# Patient Record
Sex: Female | Born: 1955 | Race: White | Hispanic: No | Smoking: Never smoker
Health system: Southern US, Community
[De-identification: ages and names within clinical notes are randomized; demographics above are authoritative.]

## PROBLEM LIST (undated history)

## (undated) DIAGNOSIS — E785 Hyperlipidemia, unspecified: Secondary | ICD-10-CM

## (undated) DIAGNOSIS — M5136 Other intervertebral disc degeneration, lumbar region: Secondary | ICD-10-CM

## (undated) DIAGNOSIS — H40009 Preglaucoma, unspecified, unspecified eye: Secondary | ICD-10-CM

## (undated) DIAGNOSIS — O24419 Gestational diabetes mellitus in pregnancy, unspecified control: Secondary | ICD-10-CM

## (undated) DIAGNOSIS — M51369 Other intervertebral disc degeneration, lumbar region without mention of lumbar back pain or lower extremity pain: Secondary | ICD-10-CM

## (undated) DIAGNOSIS — C801 Malignant (primary) neoplasm, unspecified: Secondary | ICD-10-CM

## (undated) DIAGNOSIS — I779 Disorder of arteries and arterioles, unspecified: Secondary | ICD-10-CM

## (undated) DIAGNOSIS — I219 Acute myocardial infarction, unspecified: Secondary | ICD-10-CM

## (undated) HISTORY — PX: APPENDECTOMY: SHX54

## (undated) HISTORY — PX: CHOLECYSTECTOMY: SHX55

## (undated) HISTORY — PX: ABDOMINAL HYSTERECTOMY: SHX81

---

## 1984-04-05 HISTORY — PX: AUGMENTATION MAMMAPLASTY: SUR837

## 2008-08-30 ENCOUNTER — Ambulatory Visit: Payer: Self-pay | Admitting: Sports Medicine

## 2008-10-22 ENCOUNTER — Ambulatory Visit: Payer: Self-pay | Admitting: Internal Medicine

## 2009-01-13 ENCOUNTER — Ambulatory Visit: Payer: Self-pay | Admitting: Gastroenterology

## 2009-08-11 ENCOUNTER — Emergency Department: Payer: Self-pay | Admitting: Internal Medicine

## 2009-10-28 ENCOUNTER — Ambulatory Visit: Payer: Self-pay | Admitting: Internal Medicine

## 2010-06-03 ENCOUNTER — Ambulatory Visit: Payer: Self-pay | Admitting: Otolaryngology

## 2011-10-22 ENCOUNTER — Ambulatory Visit: Payer: Self-pay | Admitting: Internal Medicine

## 2011-11-01 ENCOUNTER — Ambulatory Visit: Payer: Self-pay | Admitting: Internal Medicine

## 2012-05-01 ENCOUNTER — Ambulatory Visit: Payer: Self-pay | Admitting: Surgery

## 2012-11-22 ENCOUNTER — Ambulatory Visit: Payer: Self-pay | Admitting: Internal Medicine

## 2013-01-20 ENCOUNTER — Observation Stay: Payer: Self-pay | Admitting: Surgery

## 2013-01-20 LAB — COMPREHENSIVE METABOLIC PANEL
Anion Gap: 10 (ref 7–16)
Bilirubin,Total: 0.7 mg/dL (ref 0.2–1.0)
Calcium, Total: 9.1 mg/dL (ref 8.5–10.1)
Chloride: 105 mmol/L (ref 98–107)
Creatinine: 0.62 mg/dL (ref 0.60–1.30)
EGFR (Non-African Amer.): >60
Glucose: 144 mg/dL — ABNORMAL HIGH (ref 65–99)
SGPT (ALT): 598 U/L — ABNORMAL HIGH (ref 12–78)
Sodium: 138 mmol/L (ref 136–145)

## 2013-01-20 LAB — URINALYSIS, COMPLETE
Glucose,UR: NEGATIVE mg/dL (ref 0–75)
Nitrite: NEGATIVE
Ph: 5 (ref 4.5–8.0)
Protein: 30
RBC,UR: 2 /HPF (ref 0–5)
Specific Gravity: 1.02 (ref 1.003–1.030)
WBC UR: 4 /HPF (ref 0–5)

## 2013-01-20 LAB — CBC
Platelet: 228 10*3/uL (ref 150–440)
RBC: 4.61 10*6/uL (ref 3.80–5.20)
WBC: 9.8 10*3/uL (ref 3.6–11.0)

## 2013-01-20 LAB — LIPASE, BLOOD: Lipase: 88 U/L (ref 73–393)

## 2013-01-21 LAB — CBC WITH DIFFERENTIAL/PLATELET
Basophil #: 0 10*3/uL (ref 0.0–0.1)
HCT: 39 % (ref 35.0–47.0)
HGB: 13.4 g/dL (ref 12.0–16.0)
Lymphocyte #: 0.7 10*3/uL — ABNORMAL LOW (ref 1.0–3.6)
MCHC: 34.3 g/dL (ref 32.0–36.0)
Monocyte #: 0.8 x10 3/mm (ref 0.2–0.9)
Monocyte %: 8 %
Neutrophil #: 8.8 10*3/uL — ABNORMAL HIGH (ref 1.4–6.5)
Platelet: 187 10*3/uL (ref 150–440)
RBC: 4.14 10*6/uL (ref 3.80–5.20)
RDW: 12.7 % (ref 11.5–14.5)

## 2013-01-21 LAB — BASIC METABOLIC PANEL
BUN: 8 mg/dL (ref 7–18)
Chloride: 104 mmol/L (ref 98–107)
Creatinine: 0.63 mg/dL (ref 0.60–1.30)
EGFR (African American): >60
Glucose: 189 mg/dL — ABNORMAL HIGH (ref 65–99)
Osmolality: 275 (ref 275–301)
Potassium: 4 mmol/L (ref 3.5–5.1)
Sodium: 136 mmol/L (ref 136–145)

## 2013-01-21 LAB — HEPATIC FUNCTION PANEL A (ARMC)
Albumin: 2.9 g/dL — ABNORMAL LOW (ref 3.4–5.0)
Alkaline Phosphatase: 86 U/L (ref 50–136)
SGOT(AST): 49 U/L — ABNORMAL HIGH (ref 15–37)
SGPT (ALT): 335 U/L — ABNORMAL HIGH (ref 12–78)
Total Protein: 6.5 g/dL (ref 6.4–8.2)

## 2013-01-22 LAB — CBC WITH DIFFERENTIAL/PLATELET
Basophil #: 0 10*3/uL (ref 0.0–0.1)
Eosinophil #: 0.1 10*3/uL (ref 0.0–0.7)
Eosinophil %: 1 %
HCT: 34.7 % — ABNORMAL LOW (ref 35.0–47.0)
Lymphocyte #: 0.8 10*3/uL — ABNORMAL LOW (ref 1.0–3.6)
Lymphocyte %: 13.7 %
MCH: 31.5 pg (ref 26.0–34.0)
MCHC: 33.5 g/dL (ref 32.0–36.0)
MCV: 94 fL (ref 80–100)
Monocyte #: 0.4 x10 3/mm (ref 0.2–0.9)
Neutrophil #: 4.4 10*3/uL (ref 1.4–6.5)
Neutrophil %: 77 %
Platelet: 173 10*3/uL (ref 150–440)
RBC: 3.7 10*6/uL — ABNORMAL LOW (ref 3.80–5.20)
WBC: 5.7 10*3/uL (ref 3.6–11.0)

## 2013-01-22 LAB — HEPATIC FUNCTION PANEL A (ARMC)
Alkaline Phosphatase: 149 U/L — ABNORMAL HIGH (ref 50–136)
Bilirubin, Direct: 0.1 mg/dL (ref 0.00–0.20)
SGOT(AST): 113 U/L — ABNORMAL HIGH (ref 15–37)
Total Protein: 6 g/dL — ABNORMAL LOW (ref 6.4–8.2)

## 2013-01-22 LAB — BASIC METABOLIC PANEL
BUN: 6 mg/dL — ABNORMAL LOW (ref 7–18)
Co2: 26 mmol/L (ref 21–32)
Creatinine: 0.5 mg/dL — ABNORMAL LOW (ref 0.60–1.30)
EGFR (African American): >60
Osmolality: 280 (ref 275–301)
Potassium: 3.8 mmol/L (ref 3.5–5.1)

## 2014-01-30 ENCOUNTER — Ambulatory Visit: Payer: Self-pay | Admitting: Podiatry

## 2014-02-27 ENCOUNTER — Ambulatory Visit: Payer: Self-pay | Admitting: Internal Medicine

## 2014-11-27 DIAGNOSIS — E1169 Type 2 diabetes mellitus with other specified complication: Secondary | ICD-10-CM | POA: Insufficient documentation

## 2014-11-27 DIAGNOSIS — E785 Hyperlipidemia, unspecified: Secondary | ICD-10-CM | POA: Insufficient documentation

## 2014-11-27 DIAGNOSIS — E782 Mixed hyperlipidemia: Secondary | ICD-10-CM | POA: Insufficient documentation

## 2015-03-28 NOTE — H&P (Signed)
PATIENT NAME:  Tammie Smith, Tammie Smith MR#:  [id number] DATE OF BIRTH:  [birth year]  DATE OF ADMISSION:  01/20/2013  PRIMARY CARE PHYSICIAN: John B. Sarina Ser, MD.   ADMITTING PHYSICIAN: Rodena Goldmann, III, MD.   CHIEF COMPLAINT: Abdominal pain, nausea and vomiting.   BRIEF HISTORY: The patient is a 59 year old woman seen in the Emergency Room with a several day history of abdominal pain. The pain started several days ago in her midepigastric upper abdominal area and was associated with profound nausea and vomiting. The pain progressed over the course of the week. She has had increasing abdominal discomfort, unable to eat anything on her stomach and continued to have pain radiating through her back and shoulder. She presented to the Emergency Room for further evaluation. White blood cell count was not elevated. Liver function studies showed elevated transaminases but normal bilirubin. Ultrasound was performed, which revealed mild pericholecystic fluid and evidence for gallbladder wall thickening with multiple stones and sludge.   PAST MEDICAL HISTORY: She denies any previous history of biliary tract disease, although she has had intermittent abdominal symptoms that she has described as heartburn. She has had no fatty food intolerance. She denies history of hepatitis, yellow jaundice, pancreatitis, peptic ulcer disease. She has been diagnosed as having diverticulosis on a colonoscopy. She has no cardiac disease, hypertension or diabetes.   PAST SURGICAL HISTORY: Only previous surgeries include an appendectomy and TAH/BSO procedure.   MEDICATIONS: She takes no medications regularly.   ALLERGIES: SHE DOES HAVE AN ALLERGY TO DEMEROL.   SOCIAL HISTORY: She is not a cigarette smoker and does not drink alcohol regularly.   FAMILY HISTORY: Noncontributory with the exception of diabetes and previous gallbladder surgery in her mother.  REVIEW OF SYSTEMS: Ten-point review of systems is undertaken with the patient  and is unremarkable.   PHYSICAL EXAMINATION: GENERAL: She is an alert, pleasant, comfortable woman in moderate distress from pain.  VITAL SIGNS: Blood pressure is 130/66. Heart rate is 112 and regular. She is afebrile but was 100.3 on admission. Pain scale is now a 6.  HEENT EXAM: Unremarkable. No scleral icterus. No pupillary abnormalities. No facial deformities.  NECK: Supple and nontender with no adenopathy, and she has a midline trachea.  CHEST: Clear with no adventitious sounds. She has normal pulmonary excursion.  CARDIAC: No murmurs or gallops. She seems to be in normal sinus rhythm.  ABDOMEN: Soft. She does have some mild right upper quadrant and right flank tenderness. There is some mild guarding, with no rebound. She has hypoactive but present bowel sounds.  LOWER EXTREMITY EXAM: Full range of motion. No deformities. Good distal pulses.  PSYCHIATRIC EXAM: Normal orientation and normal affect.   ASSESSMENT AND PLAN: This woman appears to have symptomatic biliary tract disease, possible acute cholecystitis. We will plan to admit her to the hospital and start her on hydration and antibiotics. We will make plans for surgical intervention as can be scheduled in the Operating Room. Risks, benefits and options to include the common duct injury have been outlined to the patient in detail, and she is in agreement. Her husband was present for the interview.    ____________________________ Micheline Maze, MD rle:lg D: 01/20/2013 12:51:39 ET T: 01/20/2013 15:02:21 ET JOB#: [id number]  cc: Micheline Maze, MD, <Dictator> John B. Sarina Ser, MD Rodena Goldmann MD ELECTRONICALLY SIGNED 01/26/2013 17:20

## 2015-03-28 NOTE — Op Note (Signed)
PATIENT NAME:  Tammie Smith, Tammie Smith MR#:  [id number] DATE OF BIRTH:  Nov 18, 1956  DATE OF PROCEDURE:  01/21/2013  PREOPERATIVE DIAGNOSIS:  Acute cholecystitis.   POSTOPERATIVE DIAGNOSIS:  Acute cholecystitis.  PROCEDURE:  Laparoscopic cholecystectomy.  SURGEON:  Dia Crawford, MD  ANESTHESIA: General.   OPERATIVE PROCEDURE: With the patient in the supine position after the induction of appropriate general anesthesia, the patient's abdomen was prepped with ChloraPrep and draped in sterile towels. The patient was placed in the head down, feet up position. A small infraumbilical incision was made in the standard fashion and carried down bluntly through the subcutaneous tissue. The Veress needle was used to cannulate the peritoneal cavity. CO2 was insufflated to appropriate pressure measurements. When the approximately 2.5 liters of CO2 were instilled, the Veress needle was withdrawn and an 11 mm [employer] port was inserted into the peritoneal cavity. Preoperative position was confirmed. CO2 was re-insufflated. The patient was placed in the head up, feet down position and rotated slightly to the left side. A subxiphoid transverse incision was made and an 11 mm port inserted under direct vision.  Two lateral ports, 5 mm in size, were inserted under direct vision.  The gallbladder was densely inflamed, erythematous and markedly edematous.   The gallbladder was aspirated of approximately 40 mL of dirty, motor oil-colored bile. The gallbladder was then retracted superiorly and laterally exposing the hepatoduodenal ligament. The cystic artery and cystic duct were identified. The cystic duct was doubly clipped and divided. The cystic artery was doubly clipped and divided. The gallbladder was then dissected free from its bed and delivered using the hook and cautery apparatus. Dissection was quite difficult because of the severity of the inflammatory change.  A small rent was made in the dome of the gallbladder upon  removal from the liver.  An Endo Catch apparatus was inserted through the mid epigastric port and the gallbladder captured in the Endo Catch apparatus.  The upper incision was enlarged to allow for removal of the distended stone and lately gallbladder.   The area was then copiously suctioned and irrigated with 3 liters of warm saline solution. All visible stones were retrieved. A 19 [place] Blake drain was inserted through one of the incisions and brought out through the lower port site. The upper midline incision was closed with 2 figure-of-eight sutures of 0 Vicryl under tract vision using the suture passer apparatus. The area was infiltrated with 0.25% Marcaine for postoperative pain control. The abdomen was desufflated. Skin incisions were closed with 5-0 nylon. Sterile dressings were applied. The patient was returned to the recovery room having tolerated the procedure well. Sponge, instrument and needle counts were correct x 2 in the operating room.     ____________________________ Micheline Maze, MD rle:ct D: 01/21/2013 10:04:53 ET T: 01/21/2013 11:59:43 ET JOB#: [id number]  cc: Micheline Maze, MD, <Dictator> John B. Sarina Ser, MD Rodena Goldmann MD ELECTRONICALLY SIGNED 01/26/2013 17:20

## 2015-04-03 ENCOUNTER — Other Ambulatory Visit: Payer: Self-pay

## 2015-04-03 DIAGNOSIS — Z1231 Encounter for screening mammogram for malignant neoplasm of breast: Secondary | ICD-10-CM

## 2015-05-06 ENCOUNTER — Ambulatory Visit
Admission: RE | Admit: 2015-05-06 | Discharge: 2015-05-06 | Disposition: A | Discharge status: Home / Self Care | Payer: 59 | Source: Ambulatory Visit | Attending: Internal Medicine | Admitting: Internal Medicine

## 2015-05-06 DIAGNOSIS — Z1231 Encounter for screening mammogram for malignant neoplasm of breast: Secondary | ICD-10-CM

## 2015-05-06 DIAGNOSIS — Z85828 Personal history of other malignant neoplasm of skin: Secondary | ICD-10-CM | POA: Diagnosis not present

## 2015-05-06 HISTORY — DX: Malignant (primary) neoplasm, unspecified: C80.1

## 2015-12-05 ENCOUNTER — Other Ambulatory Visit: Payer: Self-pay | Admitting: Obstetrics and Gynecology

## 2015-12-05 DIAGNOSIS — T8542XA Displacement of breast prosthesis and implant, initial encounter: Secondary | ICD-10-CM

## 2015-12-15 ENCOUNTER — Encounter: Admission: RE | Payer: Self-pay | Source: Ambulatory Visit

## 2015-12-15 ENCOUNTER — Ambulatory Visit: Admission: RE | Admit: 2015-12-15 | Payer: 59 | Source: Ambulatory Visit | Admitting: Gastroenterology

## 2015-12-15 HISTORY — DX: Hyperlipidemia, unspecified: E78.5

## 2015-12-15 HISTORY — DX: Gestational diabetes mellitus in pregnancy, unspecified control: O24.419

## 2015-12-15 SURGERY — COLONOSCOPY WITH PROPOFOL
Anesthesia: General

## 2015-12-16 ENCOUNTER — Other Ambulatory Visit: Payer: 59

## 2015-12-16 ENCOUNTER — Ambulatory Visit: Payer: 59

## 2016-01-09 ENCOUNTER — Encounter: Payer: Self-pay | Admitting: *Deleted

## 2016-01-12 ENCOUNTER — Ambulatory Visit
Admission: RE | Admit: 2016-01-12 | Discharge: 2016-01-12 | Disposition: A | Discharge status: Home / Self Care | Payer: 59 | Source: Ambulatory Visit | Attending: Gastroenterology | Admitting: Gastroenterology

## 2016-01-12 ENCOUNTER — Encounter
Admission: RE | Disposition: A | Discharge status: Home / Self Care | Payer: Self-pay | Source: Ambulatory Visit | Attending: Gastroenterology

## 2016-01-12 ENCOUNTER — Encounter: Payer: Self-pay | Admitting: Anesthesiology

## 2016-01-12 ENCOUNTER — Encounter: Payer: Self-pay | Admitting: *Deleted

## 2016-01-12 DIAGNOSIS — Z8632 Personal history of gestational diabetes: Secondary | ICD-10-CM | POA: Insufficient documentation

## 2016-01-12 DIAGNOSIS — K5730 Diverticulosis of large intestine without perforation or abscess without bleeding: Secondary | ICD-10-CM | POA: Diagnosis not present

## 2016-01-12 DIAGNOSIS — Q438 Other specified congenital malformations of intestine: Secondary | ICD-10-CM | POA: Insufficient documentation

## 2016-01-12 DIAGNOSIS — E785 Hyperlipidemia, unspecified: Secondary | ICD-10-CM | POA: Insufficient documentation

## 2016-01-12 DIAGNOSIS — Z1211 Encounter for screening for malignant neoplasm of colon: Secondary | ICD-10-CM | POA: Insufficient documentation

## 2016-01-12 DIAGNOSIS — Z79899 Other long term (current) drug therapy: Secondary | ICD-10-CM | POA: Diagnosis not present

## 2016-01-12 DIAGNOSIS — Z885 Allergy status to narcotic agent status: Secondary | ICD-10-CM | POA: Insufficient documentation

## 2016-01-12 HISTORY — PX: COLONOSCOPY WITH PROPOFOL: SHX5780

## 2016-01-12 HISTORY — DX: Preglaucoma, unspecified, unspecified eye: H40.009

## 2016-01-12 LAB — HM COLONOSCOPY

## 2016-01-12 SURGERY — COLONOSCOPY WITH PROPOFOL
Anesthesia: General

## 2016-01-12 MED ORDER — MIDAZOLAM HCL 5 MG/5ML IJ SOLN
INTRAMUSCULAR | Status: AC
  Filled 2016-01-12: qty 15

## 2016-01-12 MED ORDER — FENTANYL CITRATE (PF) 100 MCG/2ML IJ SOLN
INTRAMUSCULAR | Status: DC | PRN
  Administered 2016-01-12: 25 ug via INTRAVENOUS
  Administered 2016-01-12 (×3): 12.5 ug via INTRAVENOUS

## 2016-01-12 MED ORDER — SODIUM CHLORIDE 0.9 % IV SOLN
INTRAVENOUS | Status: DC
  Administered 2016-01-12: 1000 mL via INTRAVENOUS

## 2016-01-12 MED ORDER — PROMETHAZINE HCL 25 MG/ML IJ SOLN
INTRAMUSCULAR | Status: AC
  Filled 2016-01-12: qty 1

## 2016-01-12 MED ORDER — MIDAZOLAM HCL 5 MG/5ML IJ SOLN
INTRAMUSCULAR | Status: DC | PRN
  Administered 2016-01-12: 1 mg via INTRAVENOUS
  Administered 2016-01-12: 0.5 mg via INTRAVENOUS
  Administered 2016-01-12 (×3): 1 mg via INTRAVENOUS
  Administered 2016-01-12: 2 mg via INTRAVENOUS

## 2016-01-12 MED ORDER — PROMETHAZINE HCL 25 MG/ML IJ SOLN
INTRAMUSCULAR | Status: DC | PRN
  Administered 2016-01-12: 12.5 mg via INTRAVENOUS

## 2016-01-12 MED ORDER — FENTANYL CITRATE (PF) 100 MCG/2ML IJ SOLN
INTRAMUSCULAR | Status: AC
  Filled 2016-01-12: qty 4

## 2016-01-12 NOTE — Op Note (Signed)
[facility] Gastroenterology Patient Name: Tammie Smith Procedure Date: 01/12/2016 2:29 PM MRN: [id number] Account #: [id number] Date of Birth: 01/09/1956 Admit Type: Outpatient Age: 60 Room: [facility] ENDO ROOM 3 Gender: Female Note Status: Finalized Procedure:         Colonoscopy Indications:       Screening for colorectal malignant neoplasm Providers:         Lollie Sails, MD Referring MD:      Eduard Clos. Gilford Rile, MD (Referring MD) Medicines:         Fentanyl 60 micrograms IV, Midazolam 6.5 mg IV,                     Promethazine [id number] mg IV Complications:     No immediate complications. Procedure:         Pre-Anesthesia Assessment:                    - ASA Grade Assessment: II - A patient with mild systemic                     disease.                    After obtaining informed consent, the colonoscope was                     passed under direct vision. Throughout the procedure, the                     patient's blood pressure, pulse, and oxygen saturations                     were monitored continuously. The Colonoscope was                     introduced through the anus and advanced to the the cecum,                     identified by appendiceal orifice and ileocecal valve. The                     colonoscopy was unusually difficult due to restricted                     mobility of the colon, significant looping and a tortuous                     colon. Successful completion of the procedure was aided by                     changing the patient to a supine position, using manual                     pressure and withdrawing and reinserting the scope. The                     quality of the bowel preparation was good. Findings:      Multiple small-mouthed diverticula were found in the sigmoid colon and       in the distal descending colon.      The sigmoid colon, descending colon, splenic flexure and transverse       colon were significantly redundant.  The digital rectal exam was normal. Impression:        - Diverticulosis in  the sigmoid colon and in the distal                     descending colon.                    - Redundant colon.                    - No specimens collected. Recommendation:    - Repeat colonoscopy in 10 years for screening purposes. Procedure Code(s): --- Professional ---                    [phone], Colonoscopy, flexible; diagnostic, including                     collection of specimen(s) by brushing or washing, when                     performed (separate procedure) Diagnosis Code(s): --- Professional ---                    Z12.11, Encounter for screening for malignant neoplasm of                     colon                    K57.30, Diverticulosis of large intestine without                     perforation or abscess without bleeding                    Q43.8, Other specified congenital malformations of                     intestine CPT copyright 2014 American Medical Association. All rights reserved. The codes documented in this report are preliminary and upon coder review may  be revised to meet current compliance requirements. Lollie Sails, MD 01/12/2016 4:41:34 PM This report has been signed electronically. Number of Addenda: 0 Note Initiated On: 01/12/2016 2:29 PM Scope Withdrawal Time: 0 hours 7 minutes 15 seconds  Total Procedure Duration: 0 hours 58 minutes 3 seconds       [facility]

## 2016-01-12 NOTE — H&P (Signed)
Outpatient short stay form Pre-procedure 01/12/2016 3:03 PM Lollie Sails MD  Primary Physician: Dr. Lisette Grinder  Reason for visit:  Screening colonoscopy  History of present illness:  Patient is a 60 year old female presenting today for screening colonoscopy. She has had a colonoscopy in 01/13/2009 the prep was only fair 4 and recommendation was repeat in 5 years. No family history colon cancer colon polyps. Patient did take some Excedrin tablets yesterday. He takes no other aspirin products or blood thinning agents.    Current facility-administered medications:  .  0.9 %  sodium chloride infusion, , Intravenous, Continuous, Lollie Sails, MD .  fentaNYL (SUBLIMAZE) 100 MCG/2ML injection, , , ,  .  midazolam (VERSED) 5 MG/5ML injection, , , ,  .  promethazine (PHENERGAN) 25 MG/ML injection, , , ,   Prescriptions prior to admission  Medication Sig Dispense Refill Last Dose  . Cholecalciferol 1000 units tablet Take 1,000 Units by mouth daily.     Javier Docker Oil Omega-3 300 MG CAPS Take 1 tablet by mouth daily.     . Multiple Vitamin (MULTIVITAMIN) capsule Take 1 capsule by mouth daily.     . Zinc Acetate, Oral, (ZINC ACETATE PO) Take 1 tablet by mouth daily.        Allergies  Allergen Reactions  . Demerol [Meperidine]      Past Medical History  Diagnosis Date  . Cancer ([place])     skin cancer basal cell  . Hyperlipemia   . Gestational diabetes   . Borderline glaucoma     Review of systems:      Physical Exam    Heart and lungs: Regular rate and rhythm without rub or gallop, lungs are bilaterally clear.    HEENT: Norm cephalic atraumatic eyes are anicteric    Other:     Pertinant exam for procedure: Soft nontender nondistended bowel sounds positive normoactive.    Planned proceedures: Colonoscopy and indicated procedures. I have discussed the risks benefits and complications of procedures to include not limited to bleeding, infection, perforation and the risk  of sedation and the patient wishes to proceed.    Lollie Sails, MD Gastroenterology 01/12/2016  3:03 PM

## 2016-01-13 ENCOUNTER — Encounter: Payer: Self-pay | Admitting: Gastroenterology

## 2016-02-06 ENCOUNTER — Encounter: Payer: Self-pay | Admitting: Internal Medicine

## 2017-12-21 DIAGNOSIS — M5136 Other intervertebral disc degeneration, lumbar region: Secondary | ICD-10-CM | POA: Insufficient documentation

## 2017-12-21 DIAGNOSIS — Z0000 Encounter for general adult medical examination without abnormal findings: Secondary | ICD-10-CM

## 2017-12-21 DIAGNOSIS — E118 Type 2 diabetes mellitus with unspecified complications: Secondary | ICD-10-CM | POA: Insufficient documentation

## 2017-12-21 DIAGNOSIS — M51369 Other intervertebral disc degeneration, lumbar region without mention of lumbar back pain or lower extremity pain: Secondary | ICD-10-CM | POA: Insufficient documentation

## 2017-12-21 DIAGNOSIS — I779 Disorder of arteries and arterioles, unspecified: Secondary | ICD-10-CM | POA: Insufficient documentation

## 2017-12-21 HISTORY — DX: Encounter for general adult medical examination without abnormal findings: Z00.00

## 2018-03-29 DIAGNOSIS — M791 Myalgia, unspecified site: Secondary | ICD-10-CM | POA: Insufficient documentation

## 2018-08-25 ENCOUNTER — Other Ambulatory Visit: Payer: Self-pay | Admitting: Obstetrics and Gynecology

## 2018-08-25 DIAGNOSIS — Z1231 Encounter for screening mammogram for malignant neoplasm of breast: Secondary | ICD-10-CM

## 2018-09-21 ENCOUNTER — Ambulatory Visit
Admission: RE | Admit: 2018-09-21 | Discharge: 2018-09-21 | Disposition: A | Discharge status: Home / Self Care | Payer: Managed Care, Other (non HMO) | Source: Ambulatory Visit | Attending: Obstetrics and Gynecology | Admitting: Obstetrics and Gynecology

## 2018-09-21 ENCOUNTER — Other Ambulatory Visit: Payer: Self-pay | Admitting: Obstetrics and Gynecology

## 2018-09-21 DIAGNOSIS — Z1231 Encounter for screening mammogram for malignant neoplasm of breast: Secondary | ICD-10-CM | POA: Insufficient documentation

## 2018-09-25 DIAGNOSIS — N644 Mastodynia: Secondary | ICD-10-CM | POA: Insufficient documentation

## 2018-09-25 DIAGNOSIS — T8544XA Capsular contracture of breast implant, initial encounter: Secondary | ICD-10-CM | POA: Insufficient documentation

## 2019-08-16 ENCOUNTER — Other Ambulatory Visit (HOSPITAL_COMMUNITY): Payer: Self-pay | Admitting: Gastroenterology

## 2019-08-16 ENCOUNTER — Other Ambulatory Visit: Payer: Self-pay | Admitting: Gastroenterology

## 2019-08-16 DIAGNOSIS — R1310 Dysphagia, unspecified: Secondary | ICD-10-CM

## 2019-08-22 ENCOUNTER — Ambulatory Visit
Admission: RE | Admit: 2019-08-22 | Discharge: 2019-08-22 | Disposition: A | Discharge status: Home / Self Care | Payer: Managed Care, Other (non HMO) | Source: Ambulatory Visit | Attending: Gastroenterology | Admitting: Gastroenterology

## 2019-08-22 ENCOUNTER — Other Ambulatory Visit: Payer: Self-pay

## 2019-08-22 DIAGNOSIS — R1310 Dysphagia, unspecified: Secondary | ICD-10-CM | POA: Diagnosis present

## 2019-08-28 ENCOUNTER — Other Ambulatory Visit: Payer: Self-pay | Admitting: Obstetrics and Gynecology

## 2019-08-28 DIAGNOSIS — Z1231 Encounter for screening mammogram for malignant neoplasm of breast: Secondary | ICD-10-CM

## 2019-09-14 ENCOUNTER — Other Ambulatory Visit: Payer: Self-pay

## 2019-09-14 ENCOUNTER — Other Ambulatory Visit
Admission: RE | Admit: 2019-09-14 | Discharge: 2019-09-14 | Disposition: A | Discharge status: Home / Self Care | Payer: Managed Care, Other (non HMO) | Source: Ambulatory Visit | Attending: Gastroenterology | Admitting: Gastroenterology

## 2019-09-14 DIAGNOSIS — Z20828 Contact with and (suspected) exposure to other viral communicable diseases: Secondary | ICD-10-CM | POA: Insufficient documentation

## 2019-09-14 DIAGNOSIS — Z01812 Encounter for preprocedural laboratory examination: Secondary | ICD-10-CM | POA: Diagnosis present

## 2019-09-14 LAB — SARS CORONAVIRUS 2 (TAT 6-24 HRS): SARS Coronavirus 2: NEGATIVE

## 2019-09-17 ENCOUNTER — Encounter: Payer: Self-pay | Admitting: *Deleted

## 2019-09-18 ENCOUNTER — Ambulatory Visit: Payer: Managed Care, Other (non HMO) | Admitting: Anesthesiology

## 2019-09-18 ENCOUNTER — Ambulatory Visit
Admission: RE | Admit: 2019-09-18 | Discharge: 2019-09-18 | Disposition: A | Discharge status: Home / Self Care | Payer: Managed Care, Other (non HMO) | Attending: Gastroenterology | Admitting: Gastroenterology

## 2019-09-18 ENCOUNTER — Encounter: Payer: Self-pay | Admitting: Anesthesiology

## 2019-09-18 ENCOUNTER — Encounter
Admission: RE | Disposition: A | Discharge status: Home / Self Care | Payer: Self-pay | Source: Home / Self Care | Attending: Gastroenterology

## 2019-09-18 ENCOUNTER — Other Ambulatory Visit: Payer: Self-pay

## 2019-09-18 DIAGNOSIS — Z85828 Personal history of other malignant neoplasm of skin: Secondary | ICD-10-CM | POA: Diagnosis not present

## 2019-09-18 DIAGNOSIS — K449 Diaphragmatic hernia without obstruction or gangrene: Secondary | ICD-10-CM | POA: Diagnosis not present

## 2019-09-18 DIAGNOSIS — K208 Other esophagitis without bleeding: Secondary | ICD-10-CM | POA: Insufficient documentation

## 2019-09-18 DIAGNOSIS — K2960 Other gastritis without bleeding: Secondary | ICD-10-CM | POA: Insufficient documentation

## 2019-09-18 DIAGNOSIS — K222 Esophageal obstruction: Secondary | ICD-10-CM | POA: Insufficient documentation

## 2019-09-18 DIAGNOSIS — R1310 Dysphagia, unspecified: Secondary | ICD-10-CM | POA: Diagnosis present

## 2019-09-18 DIAGNOSIS — K2980 Duodenitis without bleeding: Secondary | ICD-10-CM | POA: Insufficient documentation

## 2019-09-18 HISTORY — DX: Other intervertebral disc degeneration, lumbar region: M51.36

## 2019-09-18 HISTORY — DX: Other intervertebral disc degeneration, lumbar region without mention of lumbar back pain or lower extremity pain: M51.369

## 2019-09-18 HISTORY — PX: ESOPHAGOGASTRODUODENOSCOPY (EGD) WITH PROPOFOL: SHX5813

## 2019-09-18 HISTORY — DX: Disorder of arteries and arterioles, unspecified: I77.9

## 2019-09-18 LAB — GLUCOSE, CAPILLARY: Glucose-Capillary: 112 mg/dL — ABNORMAL HIGH (ref 70–99)

## 2019-09-18 SURGERY — ESOPHAGOGASTRODUODENOSCOPY (EGD) WITH PROPOFOL
Anesthesia: General

## 2019-09-18 MED ORDER — FENTANYL CITRATE (PF) 100 MCG/2ML IJ SOLN
INTRAMUSCULAR | Status: DC | PRN
  Administered 2019-09-18: 50 ug via INTRAVENOUS

## 2019-09-18 MED ORDER — PROPOFOL 500 MG/50ML IV EMUL
INTRAVENOUS | Status: DC | PRN
  Administered 2019-09-18: 120 ug/kg/min via INTRAVENOUS

## 2019-09-18 MED ORDER — LIDOCAINE HCL (PF) 2 % IJ SOLN
INTRAMUSCULAR | Status: AC
  Filled 2019-09-18: qty 10

## 2019-09-18 MED ORDER — FENTANYL CITRATE (PF) 100 MCG/2ML IJ SOLN
INTRAMUSCULAR | Status: AC
  Filled 2019-09-18: qty 2

## 2019-09-18 MED ORDER — SODIUM CHLORIDE 0.9 % IV SOLN
INTRAVENOUS | Status: DC
  Administered 2019-09-18: 10:00:00 via INTRAVENOUS

## 2019-09-18 MED ORDER — MIDAZOLAM HCL 2 MG/2ML IJ SOLN
INTRAMUSCULAR | Status: DC | PRN
  Administered 2019-09-18: 2 mg via INTRAVENOUS

## 2019-09-18 MED ORDER — PROPOFOL 500 MG/50ML IV EMUL
INTRAVENOUS | Status: AC
  Filled 2019-09-18: qty 50

## 2019-09-18 MED ORDER — LIDOCAINE HCL (CARDIAC) PF 100 MG/5ML IV SOSY
PREFILLED_SYRINGE | INTRAVENOUS | Status: DC | PRN
  Administered 2019-09-18: 50 mg via INTRAVENOUS

## 2019-09-18 MED ORDER — MIDAZOLAM HCL 2 MG/2ML IJ SOLN
INTRAMUSCULAR | Status: AC
  Filled 2019-09-18: qty 2

## 2019-09-18 NOTE — Anesthesia Post-op Follow-up Note (Signed)
Anesthesia QCDR form completed.        

## 2019-09-18 NOTE — Anesthesia Procedure Notes (Signed)
Performed by: Vaughan Sine Pre-anesthesia Checklist: Patient identified, Emergency Drugs available, Suction available, Patient being monitored and Timeout performed Patient Re-evaluated:Patient Re-evaluated prior to induction Oxygen Delivery Method: Nasal cannula Preoxygenation: Pre-oxygenation with 100% oxygen Airway Equipment and Method: Bite block Placement Confirmation: positive ETCO2 and CO2 detector

## 2019-09-18 NOTE — Op Note (Signed)
[facility] Gastroenterology Patient Name: Tammie Smith Procedure Date: 09/18/2019 10:25 AM MRN: [id number] Account #: [id number] Date of Birth: 11-03-1956 Admit Type: Outpatient Age: 63 Room: [facility] ENDO ROOM 3 Gender: Female Note Status: Finalized Procedure:            Upper GI endoscopy Indications:          Dysphagia, Odynophagia Providers:            Lollie Sails, MD Referring MD:         Ocie Cornfield. Ouida Sills MD, MD (Referring MD) Medicines:            Monitored Anesthesia Care Complications:        No immediate complications. Procedure:            Pre-Anesthesia Assessment:                       - ASA Grade Assessment: II - A patient with mild                        systemic disease.                       After obtaining informed consent, the endoscope was                        passed under direct vision. Throughout the procedure,                        the patient's blood pressure, pulse, and oxygen                        saturations were monitored continuously. The Endoscope                        was introduced through the mouth, and advanced to the                        third part of duodenum. The upper GI endoscopy was                        accomplished without difficulty. The patient tolerated                        the procedure well. Findings:      LA Grade C (one or more mucosal breaks continuous between tops of 2 or       more mucosal folds, less than 75% circumference) esophagitis with no       bleeding, though friable, was found. Biopsies were taken with a cold       forceps for histology.      A small to medium hiatal hernia was found. The Z-line was a variable       distance from incisors; the hiatal hernia was sliding.      Patchy mild inflammation characterized by congestion (edema) and       erosions was found in the gastric antrum, in the prepyloric region of       the stomach and at the pylorus. Biopsies were taken with a cold  forceps       for histology. Biopsies were taken with a cold forceps for Helicobacter  pylori testing.      Patchy mild inflammation characterized by congestion (edema) and       erosions was found in the duodenal bulb.      The exam of the duodenum was otherwise normal.      Biopsies were taken with a cold forceps in the second portion of the       duodenum and in the third portion of the duodenum for histology.      The cardia and gastric fundus were normal on retroflexion otherwise.      A widely patent and non-obstructing Schatzki ring was found at the       gastroesophageal junction. Impression:           - LA Grade C erosive esophagitis. Biopsied.                       - Small hiatal hernia.                       - Erosive gastritis. Biopsied.                       - Erosive duodenitis.                       - Biopsies were taken with a cold forceps for histology                        in the second portion of the duodenum and in the third                        portion of the duodenum. Recommendation:       - Discharge patient to home.                       - Await pathology results.                       - Use Aciphex (rabeprazole) 20 mg PO daily daily.                       - Return to GI clinic in 4 weeks. Procedure Code(s):    --- Professional ---                       [phone], Esophagogastroduodenoscopy, flexible, transoral;                        with biopsy, single or multiple Diagnosis Code(s):    --- Professional ---                       K20.8, Other esophagitis                       K44.9, Diaphragmatic hernia without obstruction or                        gangrene                       K29.60, Other gastritis without bleeding                       K29.80, Duodenitis  without bleeding                       R13.10, Dysphagia, unspecified CPT copyright 2019 American Medical Association. All rights reserved. The codes documented in this report are preliminary and upon  coder review may  be revised to meet current compliance requirements. Lollie Sails, MD 09/18/2019 10:54:56 AM This report has been signed electronically. Number of Addenda: 0 Note Initiated On: 09/18/2019 10:25 AM      [facility]

## 2019-09-18 NOTE — Anesthesia Postprocedure Evaluation (Signed)
Anesthesia Post Note  Patient: Tammie Smith  Procedure(s) Performed: ESOPHAGOGASTRODUODENOSCOPY (EGD) WITH PROPOFOL (N/A )  Patient location during evaluation: Endoscopy Anesthesia Type: General Level of consciousness: awake and alert and oriented Pain management: pain level controlled Vital Signs Assessment: post-procedure vital signs reviewed and stable Respiratory status: spontaneous breathing, nonlabored ventilation and respiratory function stable Cardiovascular status: blood pressure returned to baseline and stable Postop Assessment: no signs of nausea or vomiting Anesthetic complications: no     Last Vitals:  Vitals:   09/18/19 0932 09/18/19 1052  BP: 135/70 126/89  Pulse: 90   Temp: (!) 35.4 C (!) 36.1 C  SpO2: 100%     Last Pain:  Vitals:   09/18/19 1052  TempSrc: Tympanic  PainSc: 0-No pain                 Amala Petion

## 2019-09-18 NOTE — H&P (Signed)
Outpatient short stay form Pre-procedure 09/18/2019 10:24 AM Lollie Sails MD  Primary Physician: Dr. Frazier Richards  Reason for visit: EGD  History of present illness: Patient is a 63 year old female presenting today for an EGD in regards to complaints of dysphagia/odynophagia.  In further discussion with her this sounds much like globus sensation at the base of the neck.  Does take a daily Excedrin.  He denies any regurgitation of foods.  Does describe epigastric bloating sensation across the epigastrium centrally and some upper epigastric pain.  This currently is not present.  He was prescribed a PPI but has not started that yet.    Current Facility-Administered Medications:  .  0.9 %  sodium chloride infusion, , Intravenous, Continuous, Lollie Sails, MD, Last Rate: 20 mL/hr at 09/18/19 [id number]  Medications Prior to Admission  Medication Sig Dispense Refill Last Dose  . CALCIUM CARB-VIT D-C-E-MINERAL PO Take by mouth.   Past Week at Unknown time  . Cholecalciferol 1000 units tablet Take 1,000 Units by mouth daily.   Past Week at Unknown time  . Multiple Vitamin (MULTIVITAMIN) capsule Take 1 capsule by mouth daily.   Past Week at Unknown time  . NIACIN PO Take 2,000 mg by mouth daily.     . Turmeric (QC TUMERIC COMPLEX PO) Take 1 tablet by mouth daily.     Javier Docker Oil Omega-3 300 MG CAPS Take 1 tablet by mouth daily.     . Zinc Acetate, Oral, (ZINC ACETATE PO) Take 1 tablet by mouth daily.        Allergies  Allergen Reactions  . Crestor [Rosuvastatin]     Abdominal pain, muscle pain  . Demerol [Meperidine]   . Omeprazole     Abdominal pain, muscle pain     Past Medical History:  Diagnosis Date  . Borderline glaucoma   . Cancer ([place])    skin cancer basal cell  . Degenerative disc disease, lumbar   . Gestational diabetes   . Hyperlipemia   . Right-sided carotid artery disease (HCC)    echo done    Review of systems:      Physical Exam    Heart and  lungs: Regular rate and rhythm without rub or gallop lungs are bilaterally clear    HEENT: Normocephalic atraumatic eyes are anicteric    Other:    Pertinant exam for procedure: Soft nontender nondistended bowel sounds positive normoactive    Planned proceedures: EGD and indicated procedures. I have discussed the risks benefits and complications of procedures to include not limited to bleeding, infection, perforation and the risk of sedation and the patient wishes to proceed.    Lollie Sails, MD Gastroenterology 09/18/2019  10:24 AM

## 2019-09-18 NOTE — Anesthesia Preprocedure Evaluation (Signed)
Anesthesia Evaluation  Patient identified by MRN, date of birth, ID band Patient awake    Reviewed: Allergy & Precautions, NPO status , Patient's Chart, lab work & pertinent test results  History of Anesthesia Complications Negative for: history of anesthetic complications  Airway Mallampati: II  TM Distance: >3 FB Neck ROM: Full    Dental no notable dental hx.    Pulmonary neg pulmonary ROS, neg sleep apnea, neg COPD,    breath sounds clear to auscultation- rhonchi (-) wheezing      Cardiovascular Exercise Tolerance: Good (-) hypertension(-) CAD, (-) Past MI, (-) Cardiac Stents and (-) CABG  Rhythm:Regular Rate:Normal - Systolic murmurs and - Diastolic murmurs    Neuro/Psych neg Seizures negative neurological ROS  negative psych ROS   GI/Hepatic negative GI ROS, Neg liver ROS,   Endo/Other  diabetes (borderline)  Renal/GU negative Renal ROS     Musculoskeletal  (+) Arthritis ,   Abdominal (+) + obese,   Peds  Hematology negative hematology ROS (+)   Anesthesia Other Findings Past Medical History: No date: Borderline glaucoma No date: Cancer ([place])     Comment:  skin cancer basal cell No date: Degenerative disc disease, lumbar No date: Gestational diabetes No date: Hyperlipemia No date: Right-sided carotid artery disease (HCC)     Comment:  echo done   Reproductive/Obstetrics                             Anesthesia Physical Anesthesia Plan  ASA: II  Anesthesia Plan: General   Post-op Pain Management:    Induction: Intravenous  PONV Risk Score and Plan: 2 and Propofol infusion  Airway Management Planned: Natural Airway  Additional Equipment:   Intra-op Plan:   Post-operative Plan:   Informed Consent: I have reviewed the patients History and Physical, chart, labs and discussed the procedure including the risks, benefits and alternatives for the proposed anesthesia with  the patient or authorized representative who has indicated his/her understanding and acceptance.     Dental advisory given  Plan Discussed with: CRNA and Anesthesiologist  Anesthesia Plan Comments:         Anesthesia Quick Evaluation

## 2019-09-18 NOTE — Transfer of Care (Signed)
Immediate Anesthesia Transfer of Care Note  Patient: Tammie Smith  Procedure(s) Performed: ESOPHAGOGASTRODUODENOSCOPY (EGD) WITH PROPOFOL (N/A )  Patient Location: PACU  Anesthesia Type:General  Level of Consciousness: awake and sedated  Airway & Oxygen Therapy: Patient Spontanous Breathing and Patient connected to nasal cannula oxygen  Post-op Assessment: Report given to RN and Post -op Vital signs reviewed and stable  Post vital signs: Reviewed and stable  Last Vitals:  Vitals Value Taken Time  BP    Temp    Pulse    Resp    SpO2      Last Pain: There were no vitals filed for this visit.       Complications: No apparent anesthesia complications

## 2019-09-19 ENCOUNTER — Encounter: Payer: Self-pay | Admitting: Gastroenterology

## 2019-09-19 LAB — SURGICAL PATHOLOGY

## 2019-10-02 ENCOUNTER — Other Ambulatory Visit: Payer: Self-pay | Admitting: Obstetrics and Gynecology

## 2019-10-02 ENCOUNTER — Ambulatory Visit
Admission: RE | Admit: 2019-10-02 | Discharge: 2019-10-02 | Disposition: A | Discharge status: Home / Self Care | Payer: Managed Care, Other (non HMO) | Source: Ambulatory Visit | Attending: Obstetrics and Gynecology | Admitting: Obstetrics and Gynecology

## 2019-10-02 DIAGNOSIS — Z1231 Encounter for screening mammogram for malignant neoplasm of breast: Secondary | ICD-10-CM

## 2020-04-01 ENCOUNTER — Encounter: Payer: Self-pay | Admitting: Gastroenterology

## 2020-04-01 ENCOUNTER — Other Ambulatory Visit: Payer: Self-pay

## 2020-04-01 ENCOUNTER — Ambulatory Visit (INDEPENDENT_AMBULATORY_CARE_PROVIDER_SITE_OTHER): Payer: Managed Care, Other (non HMO) | Admitting: Gastroenterology

## 2020-04-01 VITALS — BP 149/87 | HR 94 | Temp 98.7°F | Ht 63.0 in | Wt 161.2 lb

## 2020-04-01 DIAGNOSIS — K219 Gastro-esophageal reflux disease without esophagitis: Secondary | ICD-10-CM | POA: Diagnosis not present

## 2020-04-01 DIAGNOSIS — R112 Nausea with vomiting, unspecified: Secondary | ICD-10-CM | POA: Diagnosis not present

## 2020-04-01 MED ORDER — RABEPRAZOLE SODIUM 20 MG PO TBEC: 20.0000 mg | DELAYED_RELEASE_TABLET | Freq: Every day | ORAL | 3 refills | Status: DC

## 2020-04-01 NOTE — Progress Notes (Signed)
Gastroenterology Consultation  Referring Provider:     Kirk Ruths, MD Primary Care Physician:  Kirk Ruths, MD Primary Gastroenterologist:  Dr. Allen Norris     Reason for Consultation:     Nausea        HPI:   Tammie Smith is a 64 y.o. y/o female referred for consultation & management of nausea by Dr. Ouida Sills, Ocie Cornfield, MD.  This patient comes to me after being seen by GI at [facility] clinic.  The patient has met me before because I had diagnosed her husband with esophageal cancer.  The patient reports that she has had chronic nausea and was started on pantoprazole by the nurse practitioner.  She then underwent an upper endoscopy and was told that she had grade C esophagitis although the photographs taken at the time of the endoscopy appeared to be grade A esophagitis.  The patient reports that when she had the upper endoscopy she was told by the gastroenterologist at that time that she should be on AcipHex instead of the pantoprazole.  The patient never started the pantoprazole and was taking the AcipHex with some relief of her symptoms while taking Phenergan 12.5 mg as needed for her nausea.  The patient states that she ran out of the medication and had not gotten it refilled and started the pantoprazole that she had already filled previously.  She states that she did not have the same relief from the pantoprazole that she did from the AcipHex.  There is no report of any unexplained weight loss fevers chills nausea or vomiting.  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  She then reports that she lost her 65 year old brother to a motorcycle accident and subsequent to that her husband was diagnosed with esophageal cancer.  The patient denies any worry symptoms such as hematemesis.  Past Medical History:  Diagnosis Date  .  Borderline glaucoma   . Cancer ([place])    skin cancer basal cell  . Degenerative disc disease, lumbar   . Gestational diabetes   . Hyperlipemia   . Right-sided carotid artery disease ([place])    echo done    Past Surgical History:  Procedure Laterality Date  . ABDOMINAL HYSTERECTOMY    . APPENDECTOMY    . AUGMENTATION MAMMAPLASTY Bilateral 04/05/1984   Pt had implants removed last time  . CHOLECYSTECTOMY    . COLONOSCOPY WITH PROPOFOL N/A 01/12/2016   Procedure: COLONOSCOPY WITH PROPOFOL;  Surgeon: Lollie Sails, MD;  Location: [facility] ENDOSCOPY;  Service: Endoscopy;  Laterality: N/A;  . ESOPHAGOGASTRODUODENOSCOPY (EGD) WITH PROPOFOL N/A 09/18/2019   Procedure: ESOPHAGOGASTRODUODENOSCOPY (EGD) WITH PROPOFOL;  Surgeon: Lollie Sails, MD;  Location: [facility] ENDOSCOPY;  Service: Endoscopy;  Laterality: N/A;    Prior to Admission medications   Medication Sig Start Date End Date Taking? Authorizing Provider  CALCIUM CARB-VIT D-C-E-MINERAL PO Take by mouth.   Yes [provider]  Cholecalciferol 1000 units tablet Take 1,000 Units by mouth daily.   Yes [provider]  escitalopram (LEXAPRO) 10 MG tablet Take 1 tablet by mouth daily. 02/12/20 05/12/20 Yes [provider]  Astrid Drafts Omega-3 300 MG CAPS Take 1 tablet by mouth daily.   Yes [provider]  Multiple Minerals-Vitamins (BONE ESSENTIALS) CAPS Take 1 capsule by mouth 1 day or 1 dose.  Yes [provider]  Multiple Vitamin (MULTIVITAMIN) capsule Take 1 capsule by mouth daily.   Yes [provider]  NIACIN PO Take 2,000 mg by mouth daily.   Yes [provider]  promethazine (PHENERGAN) 12.5 MG tablet Take 12.5 mg by mouth every 8 (eight) hours as needed. 03/28/20  Yes [provider]  RABEprazole (ACIPHEX) 20 MG tablet Take 1 tablet (20 mg total) by mouth daily. 04/01/20  Yes Lucilla Lame, MD  Turmeric (QC TUMERIC COMPLEX PO) Take 1 tablet by mouth daily.   Yes [provider]  Zinc Acetate, Oral, (ZINC ACETATE PO) Take 1 tablet by mouth daily.   Yes [provider]    Family History  Problem Relation Age of Onset  . Breast cancer Neg Hx      Social History   Tobacco Use  . Smoking status: Never Smoker  . Smokeless tobacco: Never Used  Substance Use Topics  . Alcohol use: No  . Drug use: Never    Allergies as of 04/01/2020 - Review Complete 04/01/2020  Allergen Reaction Noted  . Codeine Nausea And Vomiting 12/05/2018  . Crestor [rosuvastatin]  09/17/2019  . Demerol [meperidine]  12/12/2015  . Omeprazole  09/17/2019    Review of Systems:    All systems reviewed and negative except where noted in HPI.   Physical Exam:  BP (!) 149/87   Pulse 94   Temp 98.7 F (37.1 C) (Oral)   Ht 5' 3"  (1.6 m)   Wt 161 lb 3.2 oz (73.1 kg)   BMI 28.56 kg/m  No LMP recorded. Patient has had a hysterectomy. General:   Alert,  Well-developed, well-nourished, pleasant and cooperative in NAD Head:  Normocephalic and atraumatic. Eyes:  Sclera clear, no icterus.   Conjunctiva pink. Ears:  Normal auditory acuity. Neck:  Supple; no masses or thyromegaly. Lungs:  Respirations even and unlabored.  Clear throughout to auscultation.   No wheezes, crackles, or rhonchi. No acute distress. Heart:  Regular rate and rhythm; no murmurs, clicks, rubs, or gallops. Abdomen:  Normal bowel sounds.  No bruits.  Soft, non-tender and non-distended without masses, hepatosplenomegaly or hernias noted.  No guarding or rebound tenderness.  Negative Carnett sign.   Rectal:  Deferred.  Pulses:  Normal pulses noted. Extremities:  No clubbing or edema.  No cyanosis. Neurologic:  Alert and oriented x3;  grossly normal neurologically. Skin:  Intact without significant lesions or rashes.  No jaundice. Lymph Nodes:  No significant cervical adenopathy. Psych:  Alert and cooperative. Normal mood and affect.  Imaging Studies: No results found.  Assessment and Plan:    Tammie Smith is a 64 y.o. y/o female who comes in today with a history of esophagitis and nausea which is likely caused by her reflux.  The patient states that she does better on the rabeprazole and she would like a 90-day supply of this.  The patient will be given a 90-day supply of this and has been told that if she runs low on the Phenergan we can also get her a prescription of that also.  The patient has been told that if her heartburn does not get any better she should let me know.  Patient has been explained the plan agrees with plan.    Lucilla Lame, MD. Marval Regal    Note: This dictation was prepared with Dragon dictation along with smaller phrase technology. Any transcriptional errors that result from this process are unintentional.

## 2020-09-17 ENCOUNTER — Other Ambulatory Visit: Payer: Self-pay | Admitting: Obstetrics and Gynecology

## 2020-09-17 DIAGNOSIS — Z1231 Encounter for screening mammogram for malignant neoplasm of breast: Secondary | ICD-10-CM

## 2020-10-17 ENCOUNTER — Other Ambulatory Visit: Payer: Self-pay

## 2020-10-17 ENCOUNTER — Ambulatory Visit
Admission: RE | Admit: 2020-10-17 | Discharge: 2020-10-17 | Disposition: A | Discharge status: Home / Self Care | Payer: Managed Care, Other (non HMO) | Source: Ambulatory Visit | Attending: Obstetrics and Gynecology | Admitting: Obstetrics and Gynecology

## 2020-10-17 DIAGNOSIS — Z1231 Encounter for screening mammogram for malignant neoplasm of breast: Secondary | ICD-10-CM | POA: Diagnosis present

## 2021-03-05 ENCOUNTER — Ambulatory Visit: Payer: Managed Care, Other (non HMO) | Admitting: Gastroenterology

## 2021-03-05 ENCOUNTER — Encounter: Payer: Self-pay | Admitting: Gastroenterology

## 2021-03-05 ENCOUNTER — Other Ambulatory Visit: Payer: Self-pay

## 2021-03-05 VITALS — BP 149/88 | HR 125 | Ht 63.0 in | Wt 163.0 lb

## 2021-03-05 DIAGNOSIS — K219 Gastro-esophageal reflux disease without esophagitis: Secondary | ICD-10-CM

## 2021-03-05 DIAGNOSIS — R112 Nausea with vomiting, unspecified: Secondary | ICD-10-CM | POA: Diagnosis not present

## 2021-03-05 MED ORDER — RABEPRAZOLE SODIUM 20 MG PO TBEC
20.0000 mg | DELAYED_RELEASE_TABLET | Freq: Every day | ORAL | 3 refills | Status: DC
Start: 2021-03-05 — End: 2022-08-16

## 2021-03-05 NOTE — Progress Notes (Signed)
Primary Care Physician: Kirk Ruths, MD  Primary Gastroenterologist:  Dr. Lucilla Lame  Chief Complaint  Patient presents with  . Medication Refill    HPI: Tammie Smith is a 65 y.o. female here for follow-up after being seen by me last year for nausea.  The patient had previously been seen at [facility] clinic and was diagnosed with esophagitis.  The patient had been doing well with rabeprazole and Phenergan. The patient reports that she sometimes wakes up in the middle the night with some epigastric discomfort which is readily relieved with taking crackers.  The patient has been taking her PPI in the morning and states that she does not have any symptoms during the entire day.  There is some intermittent nausea reported but also not on a regular basis.  Past Medical History:  Diagnosis Date  . Borderline glaucoma   . Cancer ([place])    skin cancer basal cell  . Degenerative disc disease, lumbar   . Gestational diabetes   . Hyperlipemia   . Right-sided carotid artery disease ([place])    echo done    Current Outpatient Medications  Medication Sig Dispense Refill  . CALCIUM CARB-VIT D-C-E-MINERAL PO Take by mouth.    . Cholecalciferol 1000 units tablet Take 1,000 Units by mouth daily.    Javier Docker Oil Omega-3 300 MG CAPS Take 1 tablet by mouth daily.    . Multiple Vitamin (MULTIVITAMIN) capsule Take 1 capsule by mouth daily.    Marland Kitchen NIACIN PO Take 2,000 mg by mouth daily.    . promethazine (PHENERGAN) 12.5 MG tablet Take 12.5 mg by mouth every 8 (eight) hours as needed.    . RABEprazole (ACIPHEX) 20 MG tablet Take 1 tablet (20 mg total) by mouth daily. 90 tablet 3  . Turmeric (QC TUMERIC COMPLEX PO) Take 1 tablet by mouth daily.    . Zinc Acetate, Oral, (ZINC ACETATE PO) Take 1 tablet by mouth daily.     No current facility-administered medications for this visit.    Allergies as of 03/05/2021 - Review Complete 03/05/2021  Allergen Reaction Noted  . Codeine Nausea And  Vomiting 12/05/2018  . Crestor [rosuvastatin]  09/17/2019  . Demerol [meperidine]  12/12/2015  . Omeprazole  09/17/2019    ROS:  General: Negative for anorexia, weight loss, fever, chills, fatigue, weakness. ENT: Negative for hoarseness, difficulty swallowing , nasal congestion. CV: Negative for chest pain, angina, palpitations, dyspnea on exertion, peripheral edema.  Respiratory: Negative for dyspnea at rest, dyspnea on exertion, cough, sputum, wheezing.  GI: See history of present illness. GU:  Negative for dysuria, hematuria, urinary incontinence, urinary frequency, nocturnal urination.  Endo: Negative for unusual weight change.    Physical Examination:   BP (!) 149/88   Pulse (!) 125   Ht 5\' 3"  (1.6 m)   Wt 163 lb (73.9 kg)   BMI 28.87 kg/m   General: Well-nourished, well-developed in no acute distress.  Eyes: No icterus. Conjunctivae pink. Lungs: Clear to auscultation bilaterally. Non-labored. Heart: Regular rate and rhythm, no murmurs rubs or gallops.  Abdomen: Bowel sounds are normal, nontender, nondistended, no hepatosplenomegaly or masses, no abdominal bruits or hernia , no rebound or guarding.   Extremities: No lower extremity edema. No clubbing or deformities. Neuro: Alert and oriented x 3.  Grossly intact. Skin: Warm and dry, no jaundice.   Psych: Alert and cooperative, normal mood and affect.  Labs:    Imaging Studies: No results found.  Assessment and Plan:  Tammie Smith is a 65 y.o. y/o female who comes in today with a history of reflux and is in need of a refill of her medication.  The patient has been told that since she has symptoms in the middle the night but usually not throughout the day she has been told to take a rabeprazole in the evening so that her blood levels of the medication is highest while she is supine and not while she is standing up.  The patient has been explained the plan and she has had her medications refilled.  The patient will  contact me if she has any further symptoms.     Lucilla Lame, MD. Marval Regal    Note: This dictation was prepared with Dragon dictation along with smaller phrase technology. Any transcriptional errors that result from this process are unintentional.

## 2021-09-24 DIAGNOSIS — F419 Anxiety disorder, unspecified: Secondary | ICD-10-CM

## 2021-09-24 HISTORY — DX: Anxiety disorder, unspecified: F41.9

## 2021-10-15 ENCOUNTER — Other Ambulatory Visit: Payer: Self-pay | Admitting: Obstetrics and Gynecology

## 2021-10-15 DIAGNOSIS — Z1231 Encounter for screening mammogram for malignant neoplasm of breast: Secondary | ICD-10-CM

## 2021-11-06 ENCOUNTER — Ambulatory Visit
Admission: RE | Admit: 2021-11-06 | Discharge: 2021-11-06 | Disposition: A | Discharge status: Home / Self Care | Payer: Managed Care, Other (non HMO) | Source: Ambulatory Visit | Attending: Obstetrics and Gynecology | Admitting: Obstetrics and Gynecology

## 2021-11-06 ENCOUNTER — Other Ambulatory Visit: Payer: Self-pay

## 2021-11-06 DIAGNOSIS — Z1231 Encounter for screening mammogram for malignant neoplasm of breast: Secondary | ICD-10-CM | POA: Diagnosis present

## 2022-05-18 ENCOUNTER — Other Ambulatory Visit (HOSPITAL_COMMUNITY): Payer: Self-pay

## 2022-08-06 DIAGNOSIS — G43919 Migraine, unspecified, intractable, without status migrainosus: Secondary | ICD-10-CM | POA: Insufficient documentation

## 2022-08-06 HISTORY — DX: Migraine, unspecified, intractable, without status migrainosus: G43.919

## 2022-08-16 ENCOUNTER — Other Ambulatory Visit: Payer: Self-pay

## 2022-08-16 ENCOUNTER — Emergency Department: Payer: Managed Care, Other (non HMO)

## 2022-08-16 ENCOUNTER — Inpatient Hospital Stay
Admission: EM | Admit: 2022-08-16 | Discharge: 2022-08-18 | DRG: 247 | Disposition: A | Discharge status: Home / Self Care | Payer: Managed Care, Other (non HMO) | Attending: Internal Medicine | Admitting: Internal Medicine

## 2022-08-16 ENCOUNTER — Observation Stay: Payer: Managed Care, Other (non HMO)

## 2022-08-16 DIAGNOSIS — E1169 Type 2 diabetes mellitus with other specified complication: Secondary | ICD-10-CM | POA: Diagnosis present

## 2022-08-16 DIAGNOSIS — I110 Hypertensive heart disease with heart failure: Secondary | ICD-10-CM | POA: Diagnosis present

## 2022-08-16 DIAGNOSIS — I10 Essential (primary) hypertension: Secondary | ICD-10-CM | POA: Diagnosis present

## 2022-08-16 DIAGNOSIS — I2510 Atherosclerotic heart disease of native coronary artery without angina pectoris: Secondary | ICD-10-CM

## 2022-08-16 DIAGNOSIS — R079 Chest pain, unspecified: Secondary | ICD-10-CM | POA: Diagnosis not present

## 2022-08-16 DIAGNOSIS — Z85828 Personal history of other malignant neoplasm of skin: Secondary | ICD-10-CM

## 2022-08-16 DIAGNOSIS — E118 Type 2 diabetes mellitus with unspecified complications: Secondary | ICD-10-CM | POA: Diagnosis present

## 2022-08-16 DIAGNOSIS — I779 Disorder of arteries and arterioles, unspecified: Secondary | ICD-10-CM | POA: Diagnosis present

## 2022-08-16 DIAGNOSIS — Z6825 Body mass index (BMI) 25.0-25.9, adult: Secondary | ICD-10-CM

## 2022-08-16 DIAGNOSIS — E785 Hyperlipidemia, unspecified: Secondary | ICD-10-CM | POA: Diagnosis present

## 2022-08-16 DIAGNOSIS — I214 Non-ST elevation (NSTEMI) myocardial infarction: Principal | ICD-10-CM | POA: Diagnosis present

## 2022-08-16 DIAGNOSIS — F32A Depression, unspecified: Secondary | ICD-10-CM | POA: Diagnosis present

## 2022-08-16 DIAGNOSIS — E663 Overweight: Secondary | ICD-10-CM | POA: Diagnosis present

## 2022-08-16 DIAGNOSIS — K219 Gastro-esophageal reflux disease without esophagitis: Secondary | ICD-10-CM | POA: Diagnosis present

## 2022-08-16 DIAGNOSIS — Z7984 Long term (current) use of oral hypoglycemic drugs: Secondary | ICD-10-CM

## 2022-08-16 DIAGNOSIS — M5136 Other intervertebral disc degeneration, lumbar region: Secondary | ICD-10-CM | POA: Diagnosis present

## 2022-08-16 DIAGNOSIS — Z79899 Other long term (current) drug therapy: Secondary | ICD-10-CM

## 2022-08-16 DIAGNOSIS — M1990 Unspecified osteoarthritis, unspecified site: Secondary | ICD-10-CM | POA: Diagnosis present

## 2022-08-16 DIAGNOSIS — E782 Mixed hyperlipidemia: Secondary | ICD-10-CM | POA: Diagnosis present

## 2022-08-16 DIAGNOSIS — I5032 Chronic diastolic (congestive) heart failure: Secondary | ICD-10-CM | POA: Diagnosis present

## 2022-08-16 HISTORY — DX: Atherosclerotic heart disease of native coronary artery without angina pectoris: I25.10

## 2022-08-16 LAB — APTT: aPTT: 27 seconds (ref 24–36)

## 2022-08-16 LAB — CBC
HCT: 42.3 % (ref 36.0–46.0)
Hemoglobin: 13.8 g/dL (ref 12.0–15.0)
MCH: 29.1 pg (ref 26.0–34.0)
MCHC: 32.6 g/dL (ref 30.0–36.0)
MCV: 89.2 fL (ref 80.0–100.0)
Platelets: 322 10*3/uL (ref 150–400)
RBC: 4.74 MIL/uL (ref 3.87–5.11)
RDW: 13 % (ref 11.5–15.5)
WBC: 6.6 10*3/uL (ref 4.0–10.5)
nRBC: 0 % (ref 0.0–0.2)

## 2022-08-16 LAB — PROTIME-INR
INR: 1 (ref 0.8–1.2)
Prothrombin Time: 13.2 seconds (ref 11.4–15.2)

## 2022-08-16 LAB — BASIC METABOLIC PANEL
Anion gap: 11 (ref 5–15)
BUN: 14 mg/dL (ref 8–23)
CO2: 24 mmol/L (ref 22–32)
Calcium: 9.7 mg/dL (ref 8.9–10.3)
Chloride: 105 mmol/L (ref 98–111)
Creatinine, Ser: 0.77 mg/dL (ref 0.44–1.00)
GFR, Estimated: >60 mL/min (ref >60)
Glucose, Bld: 125 mg/dL — ABNORMAL HIGH (ref 70–99)
Potassium: 4.1 mmol/L (ref 3.5–5.1)
Sodium: 140 mmol/L (ref 135–145)

## 2022-08-16 LAB — TROPONIN I (HIGH SENSITIVITY)
Troponin I (High Sensitivity): 38 ng/L — ABNORMAL HIGH (ref <18)
Troponin I (High Sensitivity): 44 ng/L — ABNORMAL HIGH (ref <18)

## 2022-08-16 LAB — BRAIN NATRIURETIC PEPTIDE: B Natriuretic Peptide: 157.4 pg/mL — ABNORMAL HIGH (ref 0.0–100.0)

## 2022-08-16 MED ORDER — NITROGLYCERIN 2 % TD OINT
1.0000 [in_us] | TOPICAL_OINTMENT | Freq: Four times a day (QID) | TRANSDERMAL | Status: DC | PRN
  Administered 2022-08-16: 1 [in_us] via TOPICAL
  Filled 2022-08-16: qty 1

## 2022-08-16 MED ORDER — ONDANSETRON HCL 4 MG/2ML IJ SOLN: 4.0000 mg | Freq: Four times a day (QID) | INTRAMUSCULAR | Status: DC | PRN

## 2022-08-16 MED ORDER — ACETAMINOPHEN 650 MG RE SUPP: 650.0000 mg | Freq: Four times a day (QID) | RECTAL | Status: DC | PRN

## 2022-08-16 MED ORDER — SENNOSIDES-DOCUSATE SODIUM 8.6-50 MG PO TABS: 1.0000 | ORAL_TABLET | Freq: Every evening | ORAL | Status: DC | PRN

## 2022-08-16 MED ORDER — ASPIRIN 81 MG PO CHEW
324.0000 mg | CHEWABLE_TABLET | Freq: Once | ORAL | Status: AC
  Administered 2022-08-16: 324 mg via ORAL
  Filled 2022-08-16: qty 4

## 2022-08-16 MED ORDER — LABETALOL HCL 5 MG/ML IV SOLN: 5.0000 mg | INTRAVENOUS | Status: DC | PRN

## 2022-08-16 MED ORDER — HEPARIN BOLUS VIA INFUSION
2000.0000 [IU] | Freq: Once | INTRAVENOUS | Status: AC
  Administered 2022-08-17: 2000 [IU] via INTRAVENOUS
  Filled 2022-08-16: qty 2000

## 2022-08-16 MED ORDER — HEPARIN (PORCINE) 25000 UT/250ML-% IV SOLN
800.0000 [IU]/h | INTRAVENOUS | Status: DC
  Administered 2022-08-17: 800 [IU]/h via INTRAVENOUS
  Filled 2022-08-16: qty 250

## 2022-08-16 MED ORDER — HEPARIN SODIUM (PORCINE) 5000 UNIT/ML IJ SOLN
5000.0000 [IU] | Freq: Three times a day (TID) | INTRAMUSCULAR | Status: DC
  Administered 2022-08-16: 5000 [IU] via SUBCUTANEOUS
  Filled 2022-08-16: qty 1

## 2022-08-16 MED ORDER — ACETAMINOPHEN 325 MG PO TABS
650.0000 mg | ORAL_TABLET | Freq: Four times a day (QID) | ORAL | Status: DC | PRN
  Administered 2022-08-17: 650 mg via ORAL
  Filled 2022-08-16: qty 2

## 2022-08-16 MED ORDER — DULOXETINE HCL 30 MG PO CPEP
30.0000 mg | ORAL_CAPSULE | Freq: Every day | ORAL | Status: DC
  Administered 2022-08-17 – 2022-08-18 (×2): 30 mg via ORAL
  Filled 2022-08-16 (×2): qty 1

## 2022-08-16 MED ORDER — INSULIN ASPART 100 UNIT/ML IJ SOLN
0.0000 [IU] | Freq: Three times a day (TID) | INTRAMUSCULAR | Status: DC
  Filled 2022-08-16 (×2): qty 1

## 2022-08-16 MED ORDER — MORPHINE SULFATE (PF) 2 MG/ML IV SOLN: 2.0000 mg | INTRAVENOUS | Status: DC | PRN

## 2022-08-16 MED ORDER — PROPRANOLOL HCL 20 MG PO TABS
40.0000 mg | ORAL_TABLET | Freq: Two times a day (BID) | ORAL | Status: DC
  Administered 2022-08-16 – 2022-08-18 (×4): 40 mg via ORAL
  Filled 2022-08-16 (×4): qty 2

## 2022-08-16 MED ORDER — PANTOPRAZOLE SODIUM 40 MG PO TBEC
40.0000 mg | DELAYED_RELEASE_TABLET | Freq: Every day | ORAL | Status: DC
  Administered 2022-08-16 – 2022-08-17 (×2): 40 mg via ORAL
  Filled 2022-08-16 (×2): qty 1

## 2022-08-16 MED ORDER — ONDANSETRON HCL 4 MG PO TABS: 4.0000 mg | ORAL_TABLET | Freq: Four times a day (QID) | ORAL | Status: DC | PRN

## 2022-08-16 MED ORDER — IOHEXOL 350 MG/ML SOLN
100.0000 mL | Freq: Once | INTRAVENOUS | Status: AC | PRN
  Administered 2022-08-16: 100 mL via INTRAVENOUS

## 2022-08-16 NOTE — H&P (Addendum)
History and Physical   Tammie Smith SPQ:[id number] DOB: 05/14/1956 DOA: 08/16/2022  PCP: Kirk Ruths, MD  Patient coming from: Home  I have personally briefly reviewed patient's old medical records in [place].  Chief Concern: Chest pressure  HPI: Tammie Smith is a 66 year old female with history of hypertension, non-insulin-dependent diabetes mellitus, GERD, presents to the emergency department for chief concerns of chest pain.  She reports the chest pain is associated with radiation down her arms and neck and shortness of breath with exertion for 2 weeks.  She noticed this while mowing the lawn last week.  Initial vitals in the emergency department showed temperature of 98.4, respiration rate of 17, heart rate of 80, blood pressure 150/93, SPO2 of 98% on room air.  Serum sodium is 140, potassium 4.1 chloride of 105, bicarb 24, BUN of 14, serum creatinine 0.77, GFR greater than 60, nonfasting blood glucose 125, WBC 6.6, hemoglobin 13.8, platelets of 322.  High sensitive troponin was 38 and increased to 44.  EKG in the ED showed sinus rhythm with rate of 64, QTc 398  ED treatment: 324 mg p.o. one-time dose. ---------------------------- At bedside, she is awake, alert to self, age, current location, current year.   She reports she first noticed exertional chest pressure with mowing the lawn about two weeks ago. She endorses associated shortness of breath and bilateral upper extremity radiation pain/squeezing, jaw and neck discomfort. She endorses increase salivation with the symptoms. She denies trauma to her person.   She endorses chest pain radiation to her back with the chest pressure while she is exerting herself.   She reports that she is currently caring for her husband who is in hospice for stage 4 cancer and whenever she lifts up his legs, she experiences the chest presssure and shortness of breath. She reports he is tall man, with swollen thighs,, and  he is 6 ft 4 inches.   Social history: She lives with her husband and she is his primary care giver. She denies history of tobacco, etoh, recreational drug use. She is still employed in the [employer].   Family history: none per patient. Mother had IDDM2.   ROS: Constitutional: no weight change, no fever ENT/Mouth: no sore throat, no rhinorrhea Eyes: no eye pain, no vision changes Cardiovascular: + chest pain, + dyspnea,  no edema, no palpitations Respiratory: no cough, no sputum, no wheezing Gastrointestinal: no nausea, no vomiting, no diarrhea, no constipation Genitourinary: no urinary incontinence, no dysuria, no hematuria Musculoskeletal: no arthralgias, no myalgias Skin: no skin lesions, no pruritus, Neuro: + weakness, no loss of consciousness, no syncope Psych: no anxiety, no depression, + decrease appetite Heme/Lymph: no bruising, no bleeding  ED Course: Discussed with emergency medicine provider, patient requiring hospitalization for chief concerns of unstable angina.  Assessment/Plan  Principal Problem:   NSTEMI (non-ST elevated myocardial infarction) ([place]) Active Problems:   Controlled type 2 diabetes mellitus with complication, without long-term current use of insulin (HCC)   DDD (degenerative disc disease), lumbar   Hyperlipidemia   Right-sided carotid artery disease (HCC)   Essential hypertension   Chest pain   Depression   GERD (gastroesophageal reflux disease)   Assessment and Plan:  * NSTEMI (non-ST elevated myocardial infarction) [facility]) - Cardiology has been consulted for unstable angina, they are aware - BNP ordered - Complete echo ordered - N.p.o. after midnight except for sips of meds; anticipate left heart cath in the a.m. - Nitroglycerin ointment ordered as needed for  chest pain; morphine for severe pain not relieved with nitroglycerin - Repeat EKG as needed for chest pain  GERD (gastroesophageal reflux disease) - PPI equivalent  resumed  Depression - Duloxetine 30 mg daily resumed  Chest pain - With radiation to her back, reports stabbing - CTA chest/abdomen/pelvis for dissection ordered - I suspect is secondary to CAD/unstable angina however given patient has never had a CTA and is endorsing chest pain with radiation to her shoulder blades, dissection cannot be excluded at this time and we are ordering heparin GGT  Essential hypertension - Labetalol 5 mg IV every 2 hours as needed for SBP greater than 175, 4 doses ordered  Chart reviewed.   DVT prophylaxis: Heparin GTT Code Status: Full code Diet: Heart healthy now; n.p.o. after midnight Family Communication: No Disposition Plan: Pending clinical course Consults called: Cardiology Admission status: Progressive cardiac, observation  Past Medical History:  Diagnosis Date   Borderline glaucoma    Cancer ([place])    skin cancer basal cell   Degenerative disc disease, lumbar    Gestational diabetes    Hyperlipemia    Right-sided carotid artery disease ([place])    echo done   Past Surgical History:  Procedure Laterality Date   ABDOMINAL HYSTERECTOMY     APPENDECTOMY     AUGMENTATION MAMMAPLASTY Bilateral 04/05/1984   Pt had implants removed last time   CHOLECYSTECTOMY     COLONOSCOPY WITH PROPOFOL N/A 01/12/2016   Procedure: COLONOSCOPY WITH PROPOFOL;  Surgeon: Lollie Sails, MD;  Location: [facility] ENDOSCOPY;  Service: Endoscopy;  Laterality: N/A;   ESOPHAGOGASTRODUODENOSCOPY (EGD) WITH PROPOFOL N/A 09/18/2019   Procedure: ESOPHAGOGASTRODUODENOSCOPY (EGD) WITH PROPOFOL;  Surgeon: Lollie Sails, MD;  Location: [facility] ENDOSCOPY;  Service: Endoscopy;  Laterality: N/A;   Social History:  reports that she has never smoked. She has never used smokeless tobacco. She reports that she does not drink alcohol and does not use drugs.  Allergies  Allergen Reactions   Demerol [Meperidine] Nausea And Vomiting    Causes violent vomiting   Codeine Nausea And  Vomiting   Crestor [Rosuvastatin]     Abdominal pain, muscle pain   Omeprazole     Abdominal pain, muscle pain   Family History  Problem Relation Age of Onset   Breast cancer Neg Hx    Family history: Family history reviewed and not pertinent  Prior to Admission medications   Medication Sig Start Date End Date Taking? Authorizing Provider  CALCIUM CARB-VIT D-C-E-MINERAL PO Take by mouth.    [provider]  Cholecalciferol 1000 units tablet Take 1,000 Units by mouth daily.    [provider]  DULoxetine (CYMBALTA) 30 MG capsule Take 30 mg by mouth daily. 08/06/22   [provider]  Astrid Drafts Omega-3 300 MG CAPS Take 1 tablet by mouth daily.    [provider]  metFORMIN (GLUCOPHAGE-XR) 500 MG 24 hr tablet Take 500 mg by mouth daily with supper. 06/18/22   [provider]  Multiple Vitamin (MULTIVITAMIN) capsule Take 1 capsule by mouth daily.    [provider]  NIACIN PO Take 2,000 mg by mouth daily.    [provider]  propranolol (INDERAL) 40 MG tablet Take 40 mg by mouth 2 (two) times daily. 08/06/22   [provider]  Turmeric (QC TUMERIC COMPLEX PO) Take 1 tablet by mouth daily.    [provider]  Zinc Acetate, Oral, (ZINC ACETATE PO) Take 1 tablet by mouth daily.    [provider]   Physical Exam: Vitals:   08/16/22 1700 08/16/22 1830 08/16/22 1900 08/16/22 1930  BP: (!) 159/77 (!) 153/89 (!) 141/84 (!) 149/77  Pulse: 64 79 83 79  Resp: '15 15 18 15  '$ Temp:    98.1 F (36.7 C)  TempSrc:    Oral  SpO2: 99% 95% 97% 97%  Weight:      Height:       Constitutional: appears age-appropriate, frail, NAD, calm, comfortable Eyes: PERRL, lids and conjunctivae normal ENMT: Mucous membranes are moist. Posterior pharynx clear of any exudate or lesions. Age-appropriate dentition. Hearing appropriate Neck: normal, supple, no masses, no thyromegaly Respiratory: clear to auscultation bilaterally, no  wheezing, no crackles. Normal respiratory effort. No accessory muscle use.  Cardiovascular: Regular rate and rhythm, no murmurs / rubs / gallops. No extremity edema. 2+ pedal pulses. No carotid bruits.  Abdomen: no tenderness, no masses palpated, no hepatosplenomegaly. Bowel sounds positive.  Musculoskeletal: no clubbing / cyanosis. No joint deformity upper and lower extremities. Good ROM, no contractures, no atrophy. Normal muscle tone.  Skin: no rashes, lesions, ulcers. No induration Neurologic: Sensation intact. Strength 5/5 in all 4.  Psychiatric: Normal judgment and insight. Alert and oriented x 3. Normal mood.   EKG: independently reviewed, showing sinus rhythm with rate of 64, QTc 398  Chest x-ray on Admission: I personally reviewed and I agree with radiologist reading as below.  DG Chest 2 View  Result Date: 08/16/2022 CLINICAL DATA:  Chest pain. EXAM: CHEST - 2 VIEW COMPARISON:  Chest x-ray January 20, 2013. FINDINGS: The heart size and mediastinal contours are within normal limits. Both lungs are clear. No visible pleural effusions or pneumothorax. No acute osseous abnormality. Right upper quadrant clips. IMPRESSION: No active cardiopulmonary disease. Electronically Signed   By: Margaretha Sheffield M.D.   On: 08/16/2022 14:39    Labs on Admission: I have personally reviewed following labs  CBC: Recent Labs  Lab 08/16/22 1357  WBC 6.6  HGB 13.8  HCT 42.3  MCV 89.2  PLT 325   Basic Metabolic Panel: Recent Labs  Lab 08/16/22 1357  NA 140  K 4.1  CL 105  CO2 24  GLUCOSE 125*  BUN 14  CREATININE 0.77  CALCIUM 9.7   GFR: Estimated Creatinine Clearance: 66.6 mL/min (by C-G formula based on SCr of 0.77 mg/dL).  Urine analysis:    Component Value Date/Time   COLORURINE Amber 01/20/2013 0704   APPEARANCEUR Hazy 01/20/2013 0704   LABSPEC 1.020 01/20/2013 0704   PHURINE 5.0 01/20/2013 0704   GLUCOSEU Negative 01/20/2013 0704   HGBUR Negative 01/20/2013 0704    BILIRUBINUR Negative 01/20/2013 0704   KETONESUR 1+ 01/20/2013 0704   PROTEINUR 30 mg/dL 01/20/2013 0704   NITRITE Negative 01/20/2013 0704   LEUKOCYTESUR Trace 01/20/2013 0704   CRITICAL CARE Performed by: Dr. Tobie Poet  Total critical care time: 35 minutes  Critical care time was exclusive of separately billable procedures and treating other patients.  Critical care was necessary to treat or prevent imminent or life-threatening deterioration.  Critical care was time spent personally by me on the following activities: development of treatment plan with patient and/or surrogate as well as nursing, discussions with consultants, evaluation of patient's response to treatment, examination of patient, obtaining history from patient or surrogate, ordering and performing treatments and interventions, ordering and review of laboratory studies, ordering and review of radiographic studies, pulse oximetry and re-evaluation of patient's condition.  Dr. Tobie Poet [facility]  If 7PM-7AM, please contact overnight-coverage  provider If 7AM-7PM, please contact day coverage provider [email or web address]  08/16/2022, 8:01 PM

## 2022-08-16 NOTE — Hospital Course (Addendum)
[name] is a 66 year old female with history of hypertension, non-insulin-dependent diabetes mellitus, GERD, presents to the emergency department for chief concerns of chest pain.  She reports the chest pain is associated with radiation down her arms and neck and shortness of breath with exertion for 2 weeks.  She noticed this while mowing the lawn last week.  Initial vitals in the emergency department showed temperature of 98.4, respiration rate of 17, heart rate of 80, blood pressure 150/93, SPO2 of 98% on room air.  Serum sodium is 140, potassium 4.1 chloride of 105, bicarb 24, BUN of 14, serum creatinine 0.77, GFR greater than 60, nonfasting blood glucose 125, WBC 6.6, hemoglobin 13.8, platelets of 322.  High sensitive troponin was 38 and increased to 44.  EKG in the ED showed sinus rhythm with rate of 64, QTc 398  ED treatment: 324 mg p.o. one-time dose.

## 2022-08-16 NOTE — Assessment & Plan Note (Signed)
Continue recently started beta-blocker, have added Imdur.

## 2022-08-16 NOTE — ED Provider Notes (Signed)
[facility] Provider Note    Event Date/Time   First MD Initiated Contact with Patient 08/16/22 1517     (approximate)   History   Chest Pain (Cp radiates to arms, neck x 2 weeks )   HPI  Tammie Smith is a 66 y.o. female past medical history of hyperlipidemia, DJD who presents with chest pain.  Patient notes that over the last 2 weeks she has had intermittent chest pain.  Started out as quite infrequent and it has become more frequent over the last several days.  The pain is described as a stabbing type pain in the center of her chest that radiates both to her back and up to her bilateral jaws and down the bilateral arms.  Last night it lasted for about 4 hours which is the longest that had lasted.  About an hour and half prior to arrival to the ED pain started again.  In between the strong episodes of chest pain she has a dull ache in the chest.  She has associated shortness of breath with episodes denies diaphoresis or nausea.  Her husband is on home hospice and she is currently his sole caretaker this has been very stressful for her.  She does feel that the episodes of chest pain seem to come on when she is more stressed.  Did have an episode last week when she was cutting the lawn that the chest pain came on very strongly and she had to stop cutting the lawn and it subsided shortly after.  She has cut the lawn since and got short of breath but did not have recurrent chest pain.  Denies other exertional symptoms.     Past Medical History:  Diagnosis Date   Borderline glaucoma    Cancer ([place])    skin cancer basal cell   Degenerative disc disease, lumbar    Gestational diabetes    Hyperlipemia    Right-sided carotid artery disease ([place])    echo done    Patient Active Problem List   Diagnosis Date Noted   Breast pain in female 09/25/2018   Capsular contracture of breast implant 09/25/2018   Myalgia due to statin 03/29/2018   Controlled type 2 diabetes  mellitus with complication, without long-term current use of insulin ([place]) 12/21/2017   DDD (degenerative disc disease), lumbar 12/21/2017   Right-sided carotid artery disease ([place]) 12/21/2017   Hyperlipidemia 11/27/2014     Physical Exam  Triage Vital Signs: ED Triage Vitals  Enc Vitals Group     BP 08/16/22 1353 (!) 150/93     Pulse Rate 08/16/22 1353 80     Resp 08/16/22 1353 17     Temp 08/16/22 1353 98.4 F (36.9 C)     Temp Source 08/16/22 1353 Oral     SpO2 08/16/22 1353 98 %     Weight 08/16/22 1354 163 lb 2.3 oz (74 kg)     Height 08/16/22 1354 '5\' 3"'$  (1.6 m)     Head Circumference --      Peak Flow --      Pain Score 08/16/22 1354 5     Pain Loc --      Pain Edu? --      Excl. in [place]? --     Most recent vital signs: Vitals:   08/16/22 1530 08/16/22 1700  BP: (!) 147/82 (!) 159/77  Pulse: 69 64  Resp: 18 15  Temp:    SpO2: 98% 99%  General: Awake, no distress.  CV:  Good peripheral perfusion.  No peripheral edema Resp:  Normal effort.  Lungs are clear Abd:  No distention.  Neuro:             Awake, Alert, Oriented x 3  Other:     ED Results / Procedures / Treatments  Labs (all labs ordered are listed, but only abnormal results are displayed) Labs Reviewed  BASIC METABOLIC PANEL - Abnormal; Notable for the following components:      Result Value   Glucose, Bld 125 (*)    All other components within normal limits  TROPONIN I (HIGH SENSITIVITY) - Abnormal; Notable for the following components:   Troponin I (High Sensitivity) 38 (*)    All other components within normal limits  TROPONIN I (HIGH SENSITIVITY) - Abnormal; Notable for the following components:   Troponin I (High Sensitivity) 44 (*)    All other components within normal limits  CBC     EKG  EKG reviewed and interpreted by myself shows normal sinus rhythm normal axis normal intervals no acute ischemic changes   RADIOLOGY I reviewed and interpreted the CXR which does not show any  acute cardiopulmonary process    PROCEDURES:  Critical Care performed: No  Procedures  The patient is on the cardiac monitor to evaluate for evidence of arrhythmia and/or significant heart rate changes.   MEDICATIONS ORDERED IN ED: Medications  aspirin chewable tablet 324 mg (324 mg Oral Given 08/16/22 1553)     IMPRESSION / MDM / ASSESSMENT AND PLAN / ED COURSE  I reviewed the triage vital signs and the nursing notes.                              Patient's presentation is most consistent with acute presentation with potential threat to life or bodily function.  Differential diagnosis includes, but is not limited to, acute coronary syndrome, unstable angina, NSTEMI myocarditis pericarditis, pleurisy, pulmonary embolism, GERD, musculoskeletal  The patient is a 66 year old female who presents with chest pain.  She has had on and off chest pain for about 2 weeks is becoming more frequent and lasting longer.  Episode last night lasted for about 4 hours and she had another episode starting about an hour and a half prior to arrival.  She has just mild pain in her jaw on my evaluation.  Describes the pain as sharp and stabbing and radiates to her jaws back and down both arms.  Did have 1 episode of exertional pain when she was cutting the lawn last week.  She has no history of coronary disease no history of chest pain.  Does have history of newly diagnosed diabetes hypertension and history of hyperlipidemia.  Patient's vitals are notable for mild hypertension otherwise within normal limits.  She looks well lungs are clear she has no edema.  EKG does not have any obvious acute ischemic changes.  Her first troponin is 38.  I am concerned about her story that she is possibly having unstable angina.  She has no history of cardiac disease and with this mildly positive troponin I am concerned that it may continue to rise.  Expressed my concern to her and recommended admission but patient is taking care  of her husband does not want to stay in the hospital.  Agreed to repeat the troponin to further risk stratify.  Given pain started shortly prior to arrival if this was an  NSTEMI would expect it to continue to rise but would not be surprised if this is unstable angina that it would be flat and I would still recommend she stay.  We will give aspirin.   Patient's repeat troponin is 44.  This is relatively flat from her first troponin.  She is having some intermittent chest pain rating to the jaw.  Ultimately I do think that she would benefit from a stress test and I am concerned about unstable angina.  Patient is willing to stay and found care for her husband tonight.  I have discussed with the hospitalist who will admit the patient.  FINAL CLINICAL IMPRESSION(S) / ED DIAGNOSES   Final diagnoses:  Chest pain, unspecified type     Rx / DC Orders   ED Discharge Orders     None        Note:  This document was prepared using Dragon voice recognition software and may include unintentional dictation errors.   Rada Hay, MD 08/16/22 [phone]

## 2022-08-16 NOTE — Assessment & Plan Note (Signed)
-   PPI equivalent resumed

## 2022-08-16 NOTE — Assessment & Plan Note (Signed)
-   With radiation to her back, reports stabbing - CTA chest/abdomen/pelvis for dissection ordered - I suspect is secondary to CAD/unstable angina however given patient has never had a CTA and is endorsing chest pain with radiation to her shoulder blades, dissection cannot be excluded at this time and we are ordering heparin GGT

## 2022-08-16 NOTE — Assessment & Plan Note (Addendum)
Patient found to have severe three-vessel coronary artery disease including long segment of calcified mid LAD up to 80%, 50% distal LAD stenosis and subtotal occlusion 99% of OM1 +70% proximal stenosis of small nondominant RCA.  She underwent successful PCI to OM1 with 0% residual stenosis.  Initial stent placement complicated by distal edge dissection successfully treated with overlapping stent at the distal margin.  Patient started on dual antiplatelet therapy with aspirin and Brilinta x12 months plus aggressive secondary prevention with statin and Imdur.  Plan is for medical management of LAD disease with further discussion of staged PCI to be done at follow-up.  Outpatient cardiac rehab referral placed.  Patient will follow-up with cardiology in the next few weeks.

## 2022-08-16 NOTE — ED Triage Notes (Signed)
Cp radiates to arms, neck x 2 weeks

## 2022-08-16 NOTE — Consult Note (Signed)
ANTICOAGULATION CONSULT NOTE  Pharmacy Consult for IV Heparin Indication: chest pain/ACS  Patient Measurements: Height: '5\' 3"'$  (160 cm) Weight: 74 kg (163 lb 2.3 oz) IBW/kg (Calculated) : 52.4 Heparin Dosing Weight: 68.1 kg  Labs: Recent Labs    08/16/22 1357 08/16/22 1606  HGB 13.8  --   HCT 42.3  --   PLT 322  --   CREATININE 0.77  --   TROPONINIHS 38* 44*    Estimated Creatinine Clearance: 66.6 mL/min (by C-G formula based on SCr of 0.77 mg/dL).   Medical History: Past Medical History:  Diagnosis Date   Borderline glaucoma    Cancer ([place])    skin cancer basal cell   Degenerative disc disease, lumbar    Gestational diabetes    Hyperlipemia    Right-sided carotid artery disease ([place])    echo done    Medications:  No anticoagulation prior to admission per my chart review  Assessment: Patient is a 66 y/o F with medical history as above who presented to the ED 9/11 with chest pain. There is concern for NSTEMI. Pharmacy consulted to initiate and manage heparin infusion for suspected ACS.  Baseline CBC acceptable. Baseline aPTT and PT-INR are pending.  Goal of Therapy:  Heparin level 0.3-0.7 units/ml Monitor platelets by anticoagulation protocol: Yes   Plan:  --Will start heparin infusion with 2000 unit IV bolus (1/2 bolus given recent administration of [place] heparin) followed by heparin infusion at 800 units/hr --HL 6 hours after initiation of infusion --Daily CBC per protocol while on IV heparin  Benita Gutter 08/16/2022,8:03 PM

## 2022-08-16 NOTE — Assessment & Plan Note (Signed)
-   Duloxetine 30 mg daily resumed ?

## 2022-08-17 ENCOUNTER — Other Ambulatory Visit: Payer: Self-pay

## 2022-08-17 ENCOUNTER — Encounter
Admission: EM | Disposition: A | Discharge status: Home / Self Care | Payer: Self-pay | Source: Home / Self Care | Attending: Internal Medicine

## 2022-08-17 ENCOUNTER — Observation Stay
Admit: 2022-08-17 | Discharge: 2022-08-17 | Disposition: A | Discharge status: Home / Self Care | Payer: Managed Care, Other (non HMO) | Attending: Internal Medicine

## 2022-08-17 DIAGNOSIS — R079 Chest pain, unspecified: Secondary | ICD-10-CM | POA: Diagnosis present

## 2022-08-17 DIAGNOSIS — M1990 Unspecified osteoarthritis, unspecified site: Secondary | ICD-10-CM | POA: Diagnosis present

## 2022-08-17 DIAGNOSIS — Z79899 Other long term (current) drug therapy: Secondary | ICD-10-CM | POA: Diagnosis not present

## 2022-08-17 DIAGNOSIS — E785 Hyperlipidemia, unspecified: Secondary | ICD-10-CM | POA: Diagnosis present

## 2022-08-17 DIAGNOSIS — E663 Overweight: Secondary | ICD-10-CM | POA: Diagnosis present

## 2022-08-17 DIAGNOSIS — I214 Non-ST elevation (NSTEMI) myocardial infarction: Secondary | ICD-10-CM | POA: Diagnosis present

## 2022-08-17 DIAGNOSIS — I10 Essential (primary) hypertension: Secondary | ICD-10-CM | POA: Diagnosis not present

## 2022-08-17 DIAGNOSIS — R0609 Other forms of dyspnea: Secondary | ICD-10-CM | POA: Diagnosis not present

## 2022-08-17 DIAGNOSIS — I5032 Chronic diastolic (congestive) heart failure: Secondary | ICD-10-CM | POA: Diagnosis present

## 2022-08-17 DIAGNOSIS — K219 Gastro-esophageal reflux disease without esophagitis: Secondary | ICD-10-CM | POA: Diagnosis present

## 2022-08-17 DIAGNOSIS — Z85828 Personal history of other malignant neoplasm of skin: Secondary | ICD-10-CM | POA: Diagnosis not present

## 2022-08-17 DIAGNOSIS — R778 Other specified abnormalities of plasma proteins: Secondary | ICD-10-CM

## 2022-08-17 DIAGNOSIS — F32A Depression, unspecified: Secondary | ICD-10-CM | POA: Diagnosis present

## 2022-08-17 DIAGNOSIS — E118 Type 2 diabetes mellitus with unspecified complications: Secondary | ICD-10-CM

## 2022-08-17 DIAGNOSIS — M5136 Other intervertebral disc degeneration, lumbar region: Secondary | ICD-10-CM | POA: Diagnosis present

## 2022-08-17 DIAGNOSIS — I2510 Atherosclerotic heart disease of native coronary artery without angina pectoris: Secondary | ICD-10-CM | POA: Diagnosis present

## 2022-08-17 DIAGNOSIS — Z7984 Long term (current) use of oral hypoglycemic drugs: Secondary | ICD-10-CM | POA: Diagnosis not present

## 2022-08-17 DIAGNOSIS — Z6825 Body mass index (BMI) 25.0-25.9, adult: Secondary | ICD-10-CM | POA: Diagnosis not present

## 2022-08-17 DIAGNOSIS — I110 Hypertensive heart disease with heart failure: Secondary | ICD-10-CM | POA: Diagnosis present

## 2022-08-17 DIAGNOSIS — E119 Type 2 diabetes mellitus without complications: Secondary | ICD-10-CM | POA: Diagnosis not present

## 2022-08-17 DIAGNOSIS — E782 Mixed hyperlipidemia: Secondary | ICD-10-CM | POA: Diagnosis not present

## 2022-08-17 HISTORY — PX: LEFT HEART CATH AND CORONARY ANGIOGRAPHY: CATH118249

## 2022-08-17 HISTORY — PX: CORONARY STENT INTERVENTION: CATH118234

## 2022-08-17 LAB — BASIC METABOLIC PANEL
Anion gap: 10 (ref 5–15)
BUN: 17 mg/dL (ref 8–23)
CO2: 23 mmol/L (ref 22–32)
Calcium: 8.4 mg/dL — ABNORMAL LOW (ref 8.9–10.3)
Chloride: 108 mmol/L (ref 98–111)
Creatinine, Ser: 0.77 mg/dL (ref 0.44–1.00)
GFR, Estimated: >60 mL/min (ref >60)
Glucose, Bld: 127 mg/dL — ABNORMAL HIGH (ref 70–99)
Potassium: 3.8 mmol/L (ref 3.5–5.1)
Sodium: 141 mmol/L (ref 135–145)

## 2022-08-17 LAB — CBC
HCT: 38.9 % (ref 36.0–46.0)
Hemoglobin: 12.7 g/dL (ref 12.0–15.0)
MCH: 29.3 pg (ref 26.0–34.0)
MCHC: 32.6 g/dL (ref 30.0–36.0)
MCV: 89.6 fL (ref 80.0–100.0)
Platelets: 271 10*3/uL (ref 150–400)
RBC: 4.34 MIL/uL (ref 3.87–5.11)
RDW: 13 % (ref 11.5–15.5)
WBC: 5.2 10*3/uL (ref 4.0–10.5)
nRBC: 0 % (ref 0.0–0.2)

## 2022-08-17 LAB — ECHOCARDIOGRAM COMPLETE
AR max vel: 1.77 cm2
AV Area VTI: 1.76 cm2
AV Area mean vel: 1.62 cm2
AV Mean grad: 4 mmHg
AV Peak grad: 6.2 mmHg
Ao pk vel: 1.24 m/s
Area-P 1/2: 3.48 cm2
Height: 63 in
P 1/2 time: 455 msec
S' Lateral: 2.43 cm
Weight: 2368 oz

## 2022-08-17 LAB — POCT ACTIVATED CLOTTING TIME
Activated Clotting Time: 251 seconds
Activated Clotting Time: 275 seconds
Activated Clotting Time: 293 seconds

## 2022-08-17 LAB — HEPARIN LEVEL (UNFRACTIONATED)
Heparin Unfractionated: 0.37 IU/mL (ref 0.30–0.70)
Heparin Unfractionated: 0.33 IU/mL (ref 0.30–0.70)

## 2022-08-17 LAB — GLUCOSE, CAPILLARY
Glucose-Capillary: 106 mg/dL — ABNORMAL HIGH (ref 70–99)
Glucose-Capillary: 117 mg/dL — ABNORMAL HIGH (ref 70–99)
Glucose-Capillary: 117 mg/dL — ABNORMAL HIGH (ref 70–99)
Glucose-Capillary: 119 mg/dL — ABNORMAL HIGH (ref 70–99)
Glucose-Capillary: 134 mg/dL — ABNORMAL HIGH (ref 70–99)
Glucose-Capillary: 154 mg/dL — ABNORMAL HIGH (ref 70–99)

## 2022-08-17 LAB — HEMOGLOBIN A1C
Hgb A1c MFr Bld: 6.9 % — ABNORMAL HIGH (ref 4.8–5.6)
Mean Plasma Glucose: 151.33 mg/dL

## 2022-08-17 SURGERY — LEFT HEART CATH AND CORONARY ANGIOGRAPHY
Anesthesia: Moderate Sedation

## 2022-08-17 MED ORDER — HEPARIN SODIUM (PORCINE) 1000 UNIT/ML IJ SOLN
INTRAMUSCULAR | Status: AC
  Filled 2022-08-17: qty 10

## 2022-08-17 MED ORDER — MIDAZOLAM HCL 2 MG/2ML IJ SOLN
INTRAMUSCULAR | Status: DC | PRN
  Administered 2022-08-17: 1 mg via INTRAVENOUS

## 2022-08-17 MED ORDER — ENOXAPARIN SODIUM 40 MG/0.4ML IJ SOSY
40.0000 mg | PREFILLED_SYRINGE | INTRAMUSCULAR | Status: DC
  Administered 2022-08-18: 40 mg via SUBCUTANEOUS
  Filled 2022-08-17 (×3): qty 0.4

## 2022-08-17 MED ORDER — IOHEXOL 300 MG/ML  SOLN
INTRAMUSCULAR | Status: DC | PRN
  Administered 2022-08-17: 115 mL

## 2022-08-17 MED ORDER — TICAGRELOR 90 MG PO TABS
ORAL_TABLET | ORAL | Status: DC | PRN
  Administered 2022-08-17: 180 mg via ORAL

## 2022-08-17 MED ORDER — HYDRALAZINE HCL 20 MG/ML IJ SOLN: 10.0000 mg | INTRAMUSCULAR | Status: AC | PRN

## 2022-08-17 MED ORDER — SODIUM CHLORIDE 0.9 % WEIGHT BASED INFUSION
3.0000 mL/kg/h | INTRAVENOUS | Status: DC
  Administered 2022-08-17: 3 mL/kg/h via INTRAVENOUS

## 2022-08-17 MED ORDER — NITROGLYCERIN 1 MG/10 ML FOR IR/CATH LAB
INTRA_ARTERIAL | Status: AC
  Filled 2022-08-17: qty 10

## 2022-08-17 MED ORDER — ATORVASTATIN CALCIUM 20 MG PO TABS
40.0000 mg | ORAL_TABLET | Freq: Every day | ORAL | Status: DC
  Administered 2022-08-17 – 2022-08-18 (×2): 40 mg via ORAL
  Filled 2022-08-17 (×2): qty 2

## 2022-08-17 MED ORDER — LABETALOL HCL 5 MG/ML IV SOLN: 5.0000 mg | INTRAVENOUS | Status: DC | PRN

## 2022-08-17 MED ORDER — PERFLUTREN LIPID MICROSPHERE
1.0000 mL | INTRAVENOUS | Status: AC | PRN
  Administered 2022-08-17: 2 mL via INTRAVENOUS

## 2022-08-17 MED ORDER — SODIUM CHLORIDE 0.9 % WEIGHT BASED INFUSION
1.0000 mL/kg/h | INTRAVENOUS | Status: DC
  Administered 2022-08-17: 1 mL/kg/h via INTRAVENOUS

## 2022-08-17 MED ORDER — TICAGRELOR 90 MG PO TABS
ORAL_TABLET | ORAL | Status: AC
  Filled 2022-08-17: qty 2

## 2022-08-17 MED ORDER — SODIUM CHLORIDE 0.9 % IV SOLN: INTRAVENOUS | Status: AC

## 2022-08-17 MED ORDER — ASPIRIN 81 MG PO TBEC
81.0000 mg | DELAYED_RELEASE_TABLET | Freq: Every day | ORAL | Status: DC
  Administered 2022-08-17 – 2022-08-18 (×2): 81 mg via ORAL
  Filled 2022-08-17 (×2): qty 1

## 2022-08-17 MED ORDER — SODIUM CHLORIDE 0.9% FLUSH: 3.0000 mL | INTRAVENOUS | Status: DC | PRN

## 2022-08-17 MED ORDER — SODIUM CHLORIDE 0.9% FLUSH
3.0000 mL | Freq: Two times a day (BID) | INTRAVENOUS | Status: DC
  Administered 2022-08-17 – 2022-08-18 (×2): 3 mL via INTRAVENOUS

## 2022-08-17 MED ORDER — ISOSORBIDE MONONITRATE ER 30 MG PO TB24
15.0000 mg | ORAL_TABLET | Freq: Every day | ORAL | Status: DC
  Administered 2022-08-17 – 2022-08-18 (×2): 15 mg via ORAL
  Filled 2022-08-17 (×2): qty 1

## 2022-08-17 MED ORDER — SODIUM CHLORIDE 0.9% FLUSH
3.0000 mL | Freq: Two times a day (BID) | INTRAVENOUS | Status: DC
  Administered 2022-08-18: 3 mL via INTRAVENOUS

## 2022-08-17 MED ORDER — NITROGLYCERIN 1 MG/10 ML FOR IR/CATH LAB
INTRA_ARTERIAL | Status: DC | PRN
  Administered 2022-08-17 (×4): 200 ug via INTRACORONARY

## 2022-08-17 MED ORDER — ASPIRIN 81 MG PO CHEW: 81.0000 mg | CHEWABLE_TABLET | ORAL | Status: DC

## 2022-08-17 MED ORDER — MIDAZOLAM HCL 2 MG/2ML IJ SOLN
INTRAMUSCULAR | Status: AC
  Filled 2022-08-17: qty 2

## 2022-08-17 MED ORDER — VERAPAMIL HCL 2.5 MG/ML IV SOLN
INTRAVENOUS | Status: AC
  Filled 2022-08-17: qty 2

## 2022-08-17 MED ORDER — HEPARIN SODIUM (PORCINE) 1000 UNIT/ML IJ SOLN
INTRAMUSCULAR | Status: DC | PRN
  Administered 2022-08-17: 3500 [IU] via INTRAVENOUS
  Administered 2022-08-17: 2000 [IU] via INTRAVENOUS
  Administered 2022-08-17: 3500 [IU] via INTRAVENOUS
  Administered 2022-08-17: 2000 [IU] via INTRAVENOUS

## 2022-08-17 MED ORDER — LIDOCAINE HCL (PF) 1 % IJ SOLN
INTRAMUSCULAR | Status: DC | PRN
  Administered 2022-08-17: 2 mL

## 2022-08-17 MED ORDER — TICAGRELOR 90 MG PO TABS
90.0000 mg | ORAL_TABLET | Freq: Two times a day (BID) | ORAL | Status: DC
  Administered 2022-08-18: 90 mg via ORAL
  Filled 2022-08-17: qty 1

## 2022-08-17 MED ORDER — FENTANYL CITRATE (PF) 100 MCG/2ML IJ SOLN
INTRAMUSCULAR | Status: DC | PRN
  Administered 2022-08-17: 50 ug via INTRAVENOUS

## 2022-08-17 MED ORDER — HEPARIN (PORCINE) IN NACL 1000-0.9 UT/500ML-% IV SOLN
INTRAVENOUS | Status: AC
  Filled 2022-08-17: qty 1000

## 2022-08-17 MED ORDER — HEPARIN (PORCINE) IN NACL 1000-0.9 UT/500ML-% IV SOLN
INTRAVENOUS | Status: DC | PRN
  Administered 2022-08-17 (×2): 500 mL

## 2022-08-17 MED ORDER — SODIUM CHLORIDE 0.9 % IV SOLN: 250.0000 mL | INTRAVENOUS | Status: DC | PRN

## 2022-08-17 MED ORDER — VERAPAMIL HCL 2.5 MG/ML IV SOLN
INTRAVENOUS | Status: DC | PRN
  Administered 2022-08-17: 2.5 mg via INTRA_ARTERIAL

## 2022-08-17 MED ORDER — FENTANYL CITRATE (PF) 100 MCG/2ML IJ SOLN
INTRAMUSCULAR | Status: AC
  Filled 2022-08-17: qty 2

## 2022-08-17 MED ORDER — LIDOCAINE HCL 1 % IJ SOLN
INTRAMUSCULAR | Status: AC
  Filled 2022-08-17: qty 20

## 2022-08-17 SURGICAL SUPPLY — 21 items
BALLN EUPHORA RX 2.0X12 (BALLOONS) ×1
BALLN ~~LOC~~ EUPHORA RX 2.5X15 (BALLOONS) ×1
BALLOON EUPHORA RX 2.0X12 (BALLOONS) IMPLANT
BALLOON ~~LOC~~ EUPHORA RX 2.5X15 (BALLOONS) IMPLANT
BAND ZEPHYR COMPRESS 30 LONG (HEMOSTASIS) IMPLANT
CATH INFINITI 5FR AL1 (CATHETERS) IMPLANT
CATH INFINITI 5FR MULTPACK ANG (CATHETERS) IMPLANT
CATH LAUNCHER 6FR EBU3.5 (CATHETERS) IMPLANT
DRAPE BRACHIAL (DRAPES) IMPLANT
GLIDESHEATH SLEND SS 6F .021 (SHEATH) IMPLANT
GUIDEWIRE INQWIRE 1.5J.035X260 (WIRE) IMPLANT
INQWIRE 1.5J .035X260CM (WIRE) ×1
KIT ENCORE 26 ADVANTAGE (KITS) IMPLANT
PACK CARDIAC CATH (CUSTOM PROCEDURE TRAY) ×1 IMPLANT
PROTECTION STATION PRESSURIZED (MISCELLANEOUS) ×1
SET ATX SIMPLICITY (MISCELLANEOUS) IMPLANT
SHIELD X-DRAPE GOLD 12X17 (MISCELLANEOUS) IMPLANT
STATION PROTECTION PRESSURIZED (MISCELLANEOUS) IMPLANT
STENT ONYX FRONTIER 2.0X12 (Permanent Stent) IMPLANT
STENT ONYX FRONTIER 2.25X22 (Permanent Stent) IMPLANT
WIRE RUNTHROUGH .014X180CM (WIRE) IMPLANT

## 2022-08-17 NOTE — Progress Notes (Signed)
PROGRESS NOTE   HPI was taken from Dr. Tobie Smith: [name] is a 66 year old female with history of hypertension, non-insulin-dependent diabetes mellitus, GERD, presents to the emergency department for chief concerns of chest pain.   She reports the chest pain is associated with radiation down her arms and neck and shortness of breath with exertion for 2 weeks.   She noticed this while mowing the lawn last week.   Initial vitals in the emergency department showed temperature of 98.4, respiration rate of 17, heart rate of 80, blood pressure 150/93, SPO2 of 98% on room air.   Serum sodium is 140, potassium 4.1 chloride of 105, bicarb 24, BUN of 14, serum creatinine 0.77, GFR greater than 60, nonfasting blood glucose 125, WBC 6.6, hemoglobin 13.8, platelets of 322.   High sensitive troponin was 38 and increased to 44.   EKG in the ED showed sinus rhythm with rate of 64, QTc 398   ED treatment: 324 mg p.o. one-time dose. ---------------------------- At bedside, she is awake, alert to self, age, current location, current year.    She reports she first noticed exertional chest pressure with mowing the lawn about two weeks ago. She endorses associated shortness of breath and bilateral upper extremity radiation pain/squeezing, jaw and neck discomfort. She endorses increase salivation with the symptoms. She denies trauma to her person.    She endorses chest pain radiation to her back with the chest pressure while she is exerting herself.    She reports that she is currently caring for her husband who is in hospice for stage 4 cancer and whenever she lifts up his legs, she experiences the chest presssure and shortness of breath. She reports he is tall man, with swollen thighs,, and he is 6 ft 4 inches.      Tammie Smith  BTD:[id number] DOB: [birth year] DOA: 08/16/2022 PCP: Tammie Ruths, MD   Assessment & Plan:   Principal Problem:   NSTEMI (non-ST elevated myocardial infarction)  [facility]) Active Problems:   Controlled type 2 diabetes mellitus with complication, without long-term current use of insulin ([facility])   DDD (degenerative disc disease), lumbar   Hyperlipidemia   Right-sided carotid artery disease ([place])   Essential hypertension   Chest pain   Depression   GERD (gastroesophageal reflux disease)  Assessment and Plan: NSTEMI: w/ minimally elevated troponins and chest pain. Chest w/ radiation down both arms & up the neck to the jaw. Continue on IV heparin. Continue on aspirin. Echo ordered. Continue on tele. Cardiac cath today vs tomorrow as per cardio.  Nitro & morphine prn    GERD: continue on PPI    Depression: severity unknown. Continue on home dose of duloxetine    HTN: continue on home dose of propranolol   DM2: fairly well controlled, HbA1c 6.9. Will continue on SSI w/ accuchecks    DVT prophylaxis: IV heparin  Code Status: full  Family Communication: discussed pt's care w/ pt's son and answered his questions Disposition Plan: likely d/c back home  Level of care: Progressive  Status is: Inpatient Remains inpatient appropriate because: severity of illness    Consultants:  Cardio   Procedures:   Antimicrobials:   Subjective: Pt c/o intermittent chest pain  Objective: Vitals:   08/16/22 2132 08/17/22 0041 08/17/22 0450 08/17/22 0737  BP: 132/82 110/60 119/75 125/82  Pulse: 70 66 72 74  Resp: '19 18 18 16  '$ Temp: 98.6 F (37 C) 98.2 F (36.8 C) 98.1 F (36.7 C) 98.3 F (  36.8 C)  TempSrc: Oral Oral Oral Oral  SpO2: 98% 96% 98% 99%  Weight: 67.1 kg     Height: '5\' 3"'$  (1.6 m)       Intake/Output Summary (Last 24 hours) at 08/17/2022 0839 Last data filed at 08/17/2022 0825 Gross per 24 hour  Intake 56.76 ml  Output --  Net 56.76 ml   Filed Weights   08/16/22 1354 08/16/22 2132  Weight: 74 kg 67.1 kg    Examination:  General exam: Appears calm and comfortable  Respiratory system: Clear to auscultation. Respiratory effort  normal. Cardiovascular system: S1 & S2+. No  rubs, gallops or clicks.  Gastrointestinal system: Abdomen is nondistended, soft and nontender.  Normal bowel sounds heard. Central nervous system: Alert and oriented. Moves all extremities  Psychiatry: Judgement and insight appear normal. Mood & affect appropriate.     Data Reviewed: I have personally reviewed following labs and imaging studies  CBC: Recent Labs  Lab 08/16/22 1357 08/17/22 0514  WBC 6.6 5.2  HGB 13.8 12.7  HCT 42.3 38.9  MCV 89.2 89.6  PLT 322 008   Basic Metabolic Panel: Recent Labs  Lab 08/16/22 1357 08/17/22 0514  NA 140 141  K 4.1 3.8  CL 105 108  CO2 24 23  GLUCOSE 125* 127*  BUN 14 17  CREATININE 0.77 0.77  CALCIUM 9.7 8.4*   GFR: Estimated Creatinine Clearance: 63.7 mL/min (by C-G formula based on SCr of 0.77 mg/dL). Liver Function Tests: No results for input(s): "AST", "ALT", "ALKPHOS", "BILITOT", "PROT", "ALBUMIN" in the last 168 hours. No results for input(s): "LIPASE", "AMYLASE" in the last 168 hours. No results for input(s): "AMMONIA" in the last 168 hours. Coagulation Profile: Recent Labs  Lab 08/16/22 2010  INR 1.0   Cardiac Enzymes: No results for input(s): "CKTOTAL", "CKMB", "CKMBINDEX", "TROPONINI" in the last 168 hours. BNP (last 3 results) No results for input(s): "PROBNP" in the last 8760 hours. HbA1C: Recent Labs    08/16/22 1357  HGBA1C 6.9*   CBG: Recent Labs  Lab 08/17/22 0000 08/17/22 0814  GLUCAP 154* 134*   Lipid Profile: No results for input(s): "CHOL", "HDL", "LDLCALC", "TRIG", "CHOLHDL", "LDLDIRECT" in the last 72 hours. Thyroid Function Tests: No results for input(s): "TSH", "T4TOTAL", "FREET4", "T3FREE", "THYROIDAB" in the last 72 hours. Anemia Panel: No results for input(s): "VITAMINB12", "FOLATE", "FERRITIN", "TIBC", "IRON", "RETICCTPCT" in the last 72 hours. Sepsis Labs: No results for input(s): "PROCALCITON", "LATICACIDVEN" in the last 168  hours.  No results found for this or any previous visit (from the past 240 hour(s)).       Radiology Studies: CT Angio Chest/Abd/Pel for Dissection W and/or W/WO  Result Date: 08/16/2022 CLINICAL DATA:  Chest pain radiating to the EXAM: CT ANGIOGRAPHY CHEST, ABDOMEN AND PELVIS TECHNIQUE: Non-contrast CT of the chest was initially obtained. Multidetector CT imaging through the chest, abdomen and pelvis was performed using the standard protocol during bolus administration of intravenous contrast. Multiplanar reconstructed images and MIPs were obtained and reviewed to evaluate the vascular anatomy. RADIATION DOSE REDUCTION: This exam was performed according to the departmental dose-optimization program which includes automated exposure control, adjustment of the mA and/or kV according to patient size and/or use of iterative reconstruction technique. CONTRAST:  150m OMNIPAQUE IOHEXOL 350 MG/ML SOLN COMPARISON:  Chest x-ray 08/16/2022 FINDINGS: CTA CHEST FINDINGS Cardiovascular: Non contrasted images of the chest demonstrate no acute intramural hematoma. Mild aortic atherosclerosis. No aneurysm. Coronary vascular calcification. No dissection is seen. Normal cardiac size. No pericardial effusion  Mediastinum/Nodes: No enlarged mediastinal, hilar, or axillary lymph nodes. Thyroid gland, trachea, and esophagus demonstrate no significant findings. Lungs/Pleura: Lungs are clear. No pleural effusion or pneumothorax. Musculoskeletal: No chest wall abnormality. No acute or significant osseous findings. Review of the MIP images confirms the above findings. CTA ABDOMEN AND PELVIS FINDINGS VASCULAR Aorta: Normal caliber aorta without aneurysm, dissection, vasculitis or significant stenosis. Mild aortic atherosclerosis. Celiac: Patent without evidence of aneurysm, dissection, vasculitis or significant stenosis. SMA: Patent without evidence of aneurysm, dissection, vasculitis or significant stenosis. Renals: Both renal  arteries are patent without evidence of aneurysm, dissection, vasculitis, fibromuscular dysplasia or significant stenosis. IMA: Patent without evidence of aneurysm, dissection, vasculitis or significant stenosis. Inflow: Patent without evidence of aneurysm, dissection, vasculitis or significant stenosis. Review of the MIP images confirms the above findings. NON-VASCULAR Hepatobiliary: No focal liver abnormality is seen. Status post cholecystectomy. No biliary dilatation. Pancreas: Unremarkable. No pancreatic ductal dilatation or surrounding inflammatory changes. Spleen: Normal in size without focal abnormality. Adrenals/Urinary Tract: Adrenal glands are unremarkable. Kidneys are normal, without renal calculi, focal lesion, or hydronephrosis. Bladder is unremarkable. Stomach/Bowel: Stomach is within normal limits. Appendix not well seen but no right lower quadrant inflammation. No evidence of bowel wall thickening, distention, or inflammatory changes. Lymphatic: No suspicious lymph nodes Reproductive: Status post hysterectomy. No adnexal masses. Other: No abdominal wall hernia or abnormality. No abdominopelvic ascites. Musculoskeletal: No acute or significant osseous findings. Review of the MIP images confirms the above findings. IMPRESSION: 1. Negative for acute aortic dissection or aneurysm. 2. Mild atherosclerosis without significant stenosis or acute occlusive disease. 3. No CT evidence for acute intra-abdominal or pelvic abnormality. Electronically Signed   By: Donavan Foil M.D.   On: 08/16/2022 20:53   DG Chest 2 View  Result Date: 08/16/2022 CLINICAL DATA:  Chest pain. EXAM: CHEST - 2 VIEW COMPARISON:  Chest x-ray January 20, 2013. FINDINGS: The heart size and mediastinal contours are within normal limits. Both lungs are clear. No visible pleural effusions or pneumothorax. No acute osseous abnormality. Right upper quadrant clips. IMPRESSION: No active cardiopulmonary disease. Electronically Signed   By:  Margaretha Sheffield M.D.   On: 08/16/2022 14:39        Scheduled Meds:  aspirin EC  81 mg Oral Daily   DULoxetine  30 mg Oral Daily   insulin aspart  0-15 Units Subcutaneous TID AC & HS   pantoprazole  40 mg Oral QHS   propranolol  40 mg Oral BID   Continuous Infusions:  heparin 800 Units/hr (08/17/22 0054)     LOS: 0 days    Time spent: 35 mins     Wyvonnia Dusky, MD [facility] Pager [phone]  If 7PM-7AM, please contact night-coverage [email or web address] 08/17/2022, 8:39 AM

## 2022-08-17 NOTE — Consult Note (Signed)
Cardiology Consultation   Patient ID: FEVEN ALDERFER MRN: [id number]; DOB: 02/08/1956  Admit date: 08/16/2022 Date of Consult: 08/17/2022  PCP:  Tammie Ruths, MD   [facility] Providers Cardiologist:  None   {  Patient Profile:   Tammie Smith is a 66 y.o. female with a hx of HLD, DJD, DM2, carotid artery disease, esophagitis who is being seen 08/17/2022 for the evaluation of chest pain at the request of Tammie Smith.  History of Present Illness:   Tammie Smith was previously seen by Tammie Smith, last time in 2019 for palpitations. Unclear if she had a heart monitor.   She had a Myoview lexiscan in 2017 showing normal wall motion, LVEF 60%, and no ischemia. Echo in 2017 showed LVEF 55%, mild LVH, normal RV function, mild valvular regurgitation.   She denies alcohol, drug or tobacco history. H/o muscles aches on Crestor.   The patient presented to the ER 08/16/22 for chest pain. Started out with midsternal chest pain radiating into her back. 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 She is very stressed and felt stress may have been the culprit of the chest pain. However, chest pain continued to worsen. Over the last 2 weeks she noted worsening chest pain when mowing the yard. Also has noted very high blood pressures. One night, chest pain returned and lasted for about 4 hours. She didn't go tot he ER because she didn't have a caregiver for her husband. Last night her son came to look after her husband, so patient came to the ER. Husband was accepted into the hopsice house last night.   In the ER BP 150/93, pulse 80, afebrile, normal O2. L:abs showed HS trop 38>44. Na 140, K 4.1, bicarb 24, Scr 0.77, WBC 6.6, Hgb 13.8, plt 322. EKS showed NSR with no ischemic changes. CXR showed no active disease. CTA chest/ab/pelvis showed no acute dissection or aneurysm. She was given ASA '324mg'$ . She was started on IV heparin and admitted.   Past  Medical History:  Diagnosis Date   Borderline glaucoma    Cancer ([place])    skin cancer basal cell   Degenerative disc disease, lumbar    Gestational diabetes    Hyperlipemia    Right-sided carotid artery disease ([place])    echo done    Past Surgical History:  Procedure Laterality Date   ABDOMINAL HYSTERECTOMY     APPENDECTOMY     AUGMENTATION MAMMAPLASTY Bilateral 04/05/1984   Pt had implants removed last time   CHOLECYSTECTOMY     COLONOSCOPY WITH PROPOFOL N/A 01/12/2016   Procedure: COLONOSCOPY WITH PROPOFOL;  Surgeon: Lollie Sails, MD;  Location: [facility] ENDOSCOPY;  Service: Endoscopy;  Laterality: N/A;   ESOPHAGOGASTRODUODENOSCOPY (EGD) WITH PROPOFOL N/A 09/18/2019   Procedure: ESOPHAGOGASTRODUODENOSCOPY (EGD) WITH PROPOFOL;  Surgeon: Lollie Sails, MD;  Location: [facility] ENDOSCOPY;  Service: Endoscopy;  Laterality: N/A;     Home Medications:  Prior to Admission medications   Medication Sig Start Date End Date Taking? Authorizing Provider  DULoxetine (CYMBALTA) 30 MG capsule Take 30 mg by mouth daily. 08/06/22  Yes [provider]  metFORMIN (GLUCOPHAGE-XR) 500 MG 24 hr tablet Take 500 mg by mouth daily with supper. 06/18/22  Yes [provider]  propranolol (INDERAL) 40 MG tablet Take 40 mg by mouth 2 (two) times daily. 08/06/22  Yes [provider]  RABEprazole (ACIPHEX) 20 MG tablet Take 20 mg by mouth at  bedtime.   Yes [provider]  CALCIUM CARB-VIT D-C-E-MINERAL PO Take by mouth. Patient not taking: Reported on 08/16/2022    [provider]  Cholecalciferol 1000 units tablet Take 1,000 Units by mouth daily. Patient not taking: Reported on 08/16/2022    [provider]  Astrid Drafts Omega-3 300 MG CAPS Take 1 tablet by mouth daily. Patient not taking: Reported on 08/16/2022    [provider]  Multiple Vitamin (MULTIVITAMIN) capsule Take 1 capsule by mouth daily. Patient not taking: Reported on 08/16/2022    [provider]  NIACIN PO Take 2,000 mg by mouth daily. Patient not taking: Reported on 08/16/2022    [provider]  Turmeric (QC TUMERIC COMPLEX PO) Take 1 tablet by mouth daily. Patient not taking: Reported on 08/16/2022    [provider]  Zinc Acetate, Oral, (ZINC ACETATE PO) Take 1 tablet by mouth daily. Patient not taking: Reported on 08/16/2022    [provider]    Inpatient Medications: Scheduled Meds:  aspirin EC  81 mg Oral Daily   DULoxetine  30 mg Oral Daily   insulin aspart  0-15 Units Subcutaneous TID AC & HS   pantoprazole  40 mg Oral QHS   propranolol  40 mg Oral BID   Continuous Infusions:  heparin 800 Units/hr (08/17/22 0054)   PRN Meds: acetaminophen **OR** acetaminophen, labetalol, morphine injection, nitroGLYCERIN, ondansetron **OR** ondansetron (ZOFRAN) IV, senna-docusate  Allergies:    Allergies  Allergen Reactions   Demerol [Meperidine] Nausea And Vomiting    Causes violent vomiting   Codeine Nausea And Vomiting   Crestor [Rosuvastatin]     Abdominal pain, muscle pain   Omeprazole     Abdominal pain, muscle pain    Social History:   Social History   Socioeconomic History   Marital status: Married    Spouse name: Not on file   Number of children: Not on file   Years of education: Not on file   Highest education level: Not on file  Occupational History   Not on file  Tobacco Use   Smoking status: Never   Smokeless tobacco: Never  Substance and Sexual Activity   Alcohol use: No   Drug use: Never   Sexual activity: Not Currently  Other Topics Concern   Not on file  Social History Narrative   Not on file   Social Determinants of Health   Financial Resource Strain: Not on file  Food Insecurity: No Food Insecurity (08/16/2022)   Hunger Vital Sign    Worried About Running Out of Food in the Last Year: Never true    Ran Out of Food in the Last Year: Never true  Transportation Needs: No Transportation Needs  (08/16/2022)   PRAPARE - Hydrologist (Medical): No    Lack of Transportation (Non-Medical): No  Physical Activity: Not on file  Stress: Not on file  Social Connections: Not on file  Intimate Partner Violence: Not At Risk (08/16/2022)   Humiliation, Afraid, Rape, and Kick questionnaire    Fear of Current or Ex-Partner: No    Emotionally Abused: No    Physically Abused: No    Sexually Abused: No    Family History:    Family History  Problem Relation Age of Onset   Breast cancer Neg Hx      ROS:  Please see the history of present illness.   All other ROS reviewed and negative.     Physical  Exam/Data:   Vitals:   08/16/22 2132 08/17/22 0041 08/17/22 0450 08/17/22 0737  BP: 132/82 110/60 119/75 125/82  Pulse: 70 66 72 74  Resp: '19 18 18 16  '$ Temp: 98.6 F (37 C) 98.2 F (36.8 C) 98.1 F (36.7 C) 98.3 F (36.8 C)  TempSrc: Oral Oral Oral Oral  SpO2: 98% 96% 98% 99%  Weight: 67.1 kg     Height: '5\' 3"'$  (1.6 m)       Intake/Output Summary (Last 24 hours) at 08/17/2022 0841 Last data filed at 08/17/2022 0825 Gross per 24 hour  Intake 56.76 ml  Output --  Net 56.76 ml      08/16/2022    9:32 PM 08/16/2022    1:54 PM 03/05/2021    1:40 PM  Last 3 Weights  Weight (lbs) 148 lb 163 lb 2.3 oz 163 lb  Weight (kg) 67.132 kg 74 kg 73.936 kg     Body mass index is 26.22 kg/m.  General:  Well nourished, well developed, in no acute distress HEENT: normal Neck: no JVD Vascular: No carotid bruits; Distal pulses 2+ bilaterally Cardiac:  normal S1, S2; RRR; no murmur  Lungs:  clear to auscultation bilaterally, no wheezing, rhonchi or rales  Abd: soft, nontender, no hepatomegaly  Ext: no edema Musculoskeletal:  No deformities, BUE and BLE strength normal and equal Skin: warm and dry  Neuro:  CNs 2-12 intact, no focal abnormalities noted Psych:  Normal affect   EKG:  The EKG was personally reviewed and demonstrates:  NSR 78bpm, no ischemic  changes Telemetry:  Telemetry was personally reviewed and demonstrates:  Nsr HR 70  Relevant CV Studies:  Echo 2017 INTERPRETATION  NORMAL LEFT VENTRICULAR SYSTOLIC FUNCTION   WITH MILD LVH  NORMAL RIGHT VENTRICULAR SYSTOLIC FUNCTION  MILD VALVULAR REGURGITATION (See above)  NO VALVULAR STENOSIS  EF >55%   Myoview Lexiscan   FINDINGS:  Regional wall motion:  reveals normal myocardial thickening and wall  motion.  The overall quality of the study is good.    Artifacts noted: no  Left ventricular cavity: normal.   Perfusion Analysis:  SPECT images demonstrate homogeneous tracer  distribution throughout the myocardium.    Laboratory Data:  High Sensitivity Troponin:   Recent Labs  Lab 08/16/22 1357 08/16/22 1606  TROPONINIHS 38* 44*     Chemistry Recent Labs  Lab 08/16/22 1357 08/17/22 0514  NA 140 141  K 4.1 3.8  CL 105 108  CO2 24 23  GLUCOSE 125* 127*  BUN 14 17  CREATININE 0.77 0.77  CALCIUM 9.7 8.4*  GFRNONAA >60 >60  ANIONGAP 11 10    No results for input(s): "PROT", "ALBUMIN", "AST", "ALT", "ALKPHOS", "BILITOT" in the last 168 hours. Lipids No results for input(s): "CHOL", "TRIG", "HDL", "LABVLDL", "LDLCALC", "CHOLHDL" in the last 168 hours.  Hematology Recent Labs  Lab 08/16/22 1357 08/17/22 0514  WBC 6.6 5.2  RBC 4.74 4.34  HGB 13.8 12.7  HCT 42.3 38.9  MCV 89.2 89.6  MCH 29.1 29.3  MCHC 32.6 32.6  RDW 13.0 13.0  PLT 322 271   Thyroid No results for input(s): "TSH", "FREET4" in the last 168 hours.  BNP Recent Labs  Lab 08/16/22 1956  BNP 157.4*    DDimer No results for input(s): "DDIMER" in the last 168 hours.   Radiology/Studies:  CT Angio Chest/Abd/Pel for Dissection W and/or W/WO  Result Date: 08/16/2022 CLINICAL DATA:  Chest pain radiating to the EXAM: CT ANGIOGRAPHY CHEST, ABDOMEN AND PELVIS TECHNIQUE:  Non-contrast CT of the chest was initially obtained. Multidetector CT imaging through the chest, abdomen and pelvis was  performed using the standard protocol during bolus administration of intravenous contrast. Multiplanar reconstructed images and MIPs were obtained and reviewed to evaluate the vascular anatomy. RADIATION DOSE REDUCTION: This exam was performed according to the departmental dose-optimization program which includes automated exposure control, adjustment of the mA and/or kV according to patient size and/or use of iterative reconstruction technique. CONTRAST:  171m OMNIPAQUE IOHEXOL 350 MG/ML SOLN COMPARISON:  Chest x-ray 08/16/2022 FINDINGS: CTA CHEST FINDINGS Cardiovascular: Non contrasted images of the chest demonstrate no acute intramural hematoma. Mild aortic atherosclerosis. No aneurysm. Coronary vascular calcification. No dissection is seen. Normal cardiac size. No pericardial effusion Mediastinum/Nodes: No enlarged mediastinal, hilar, or axillary lymph nodes. Thyroid gland, trachea, and esophagus demonstrate no significant findings. Lungs/Pleura: Lungs are clear. No pleural effusion or pneumothorax. Musculoskeletal: No chest wall abnormality. No acute or significant osseous findings. Review of the MIP images confirms the above findings. CTA ABDOMEN AND PELVIS FINDINGS VASCULAR Aorta: Normal caliber aorta without aneurysm, dissection, vasculitis or significant stenosis. Mild aortic atherosclerosis. Celiac: Patent without evidence of aneurysm, dissection, vasculitis or significant stenosis. SMA: Patent without evidence of aneurysm, dissection, vasculitis or significant stenosis. Renals: Both renal arteries are patent without evidence of aneurysm, dissection, vasculitis, fibromuscular dysplasia or significant stenosis. IMA: Patent without evidence of aneurysm, dissection, vasculitis or significant stenosis. Inflow: Patent without evidence of aneurysm, dissection, vasculitis or significant stenosis. Review of the MIP images confirms the above findings. NON-VASCULAR Hepatobiliary: No focal liver abnormality is seen.  Status post cholecystectomy. No biliary dilatation. Pancreas: Unremarkable. No pancreatic ductal dilatation or surrounding inflammatory changes. Spleen: Normal in size without focal abnormality. Adrenals/Urinary Tract: Adrenal glands are unremarkable. Kidneys are normal, without renal calculi, focal lesion, or hydronephrosis. Bladder is unremarkable. Stomach/Bowel: Stomach is within normal limits. Appendix not well seen but no right lower quadrant inflammation. No evidence of bowel wall thickening, distention, or inflammatory changes. Lymphatic: No suspicious lymph nodes Reproductive: Status post hysterectomy. No adnexal masses. Other: No abdominal wall hernia or abnormality. No abdominopelvic ascites. Musculoskeletal: No acute or significant osseous findings. Review of the MIP images confirms the above findings. IMPRESSION: 1. Negative for acute aortic dissection or aneurysm. 2. Mild atherosclerosis without significant stenosis or acute occlusive disease. 3. No CT evidence for acute intra-abdominal or pelvic abnormality. Electronically Signed   By: KDonavan FoilM.D.   On: 08/16/2022 20:53   DG Chest 2 View  Result Date: 08/16/2022 CLINICAL DATA:  Chest pain. EXAM: CHEST - 2 VIEW COMPARISON:  Chest x-ray January 20, 2013. FINDINGS: The heart size and mediastinal contours are within normal limits. Both lungs are clear. No visible pleural effusions or pneumothorax. No acute osseous abnormality. Right upper quadrant clips. IMPRESSION: No active cardiopulmonary disease. Electronically Signed   By: FMargaretha SheffieldM.D.   On: 08/16/2022 14:39     Assessment and Plan:   NSTEMI -Presented with progressive exertional chest pain. High-sensitivity troponin minimally elevated 44.  EKG with no ischemic changes.  Given aspirin 324 and started on IV heparin. -CTA of chest abdomen pelvis negative for dissection -Patient had a normal LOttawain 2017 -Risk factors include hyperlipidemia, diabetes,  hypertension -Patient currently reports dull chest pain -Echo ordered -Continue to trend troponin -Continue aspirin.  PTA propranolol. -She has a history of myalgia on Crestor -She is NPO.  Plan for left heart cath today Risks and benefits of cardiac catheterization have been discussed with the  patient.  These include bleeding, infection, kidney damage, stroke, heart attack, death.  The patient understands these risks and is willing to proceed.   Hyperlipidemia -PTA niacin -History of myalgia on Crestor, may need to consider PCSK9i as outpatient -Check fasting lipid panel  Hypertension -PTA propranolol 40 mg twice daily -BP improved  For questions or updates, please contact [place] Please consult [email or web address] for contact info under    Signed, Emmet Messer Ninfa Meeker, PA-C  08/17/2022 8:41 AM

## 2022-08-17 NOTE — Brief Op Note (Signed)
BRIEF CARDIAC CATHETERIZATION NOTE  08/17/2022  5:53 PM  PATIENT:  Tammie Smith  66 y.o. female  PRE-OPERATIVE DIAGNOSIS:  NSTEMI  POST-OPERATIVE DIAGNOSIS:  NSTEMI  PROCEDURE:  Procedure(s): LEFT HEART CATH AND CORONARY ANGIOGRAPHY (N/A) CORONARY STENT INTERVENTION (N/A)  SURGEON:  Surgeon(s) and Role:    * Dyneisha Murchison, MD - Primary  FINDINGS: Severe two-vessel CAD with 99% OM1 stenosis with TIMI-1 flow and multifocal LAD disease with moderate-severe calcification and up to 80% stenosis. Mildly elevated LVEDP (17 mmHg). Successful PCI to OM1 using overlapping Onyx Frontier 2.25 x 22 and 2.0 x 12 mm DES with 0% residual stenosis and TIMI-3 flow.  RECOMMENDATIONS: DAPT with ASA and ticagrelor x 12 months. Aggressive secondary prevention of CAD. Escalate antianginal therapy, as tolerated; anticipate staged PCI of LAD.  Nelva Bush, MD [facility]

## 2022-08-17 NOTE — Interval H&P Note (Signed)
History and Physical Interval Note:  08/17/2022 12:12 PM  Tammie Smith  has presented today for surgery, with the diagnosis of NSTEMI.  The various methods of treatment have been discussed with the patient and family. After consideration of risks, benefits and other options for treatment, the patient has consented to  Procedure(s): LEFT HEART CATH AND CORONARY ANGIOGRAPHY (N/A) as a surgical intervention.  The patient's history has been reviewed, patient examined, no change in status, stable for surgery.  I have reviewed the patient's chart and labs.  Questions were answered to the patient's satisfaction.    Cath Lab Visit (complete for each Cath Lab visit)  Clinical Evaluation Leading to the Procedure:   ACS: Yes.    Non-ACS:  N/A  Neven Fina

## 2022-08-17 NOTE — Progress Notes (Signed)
Pt made this nurse aware of a 1-2/10 chest pain in mid chest described as a "pinching".  Paged Dr. Haroldine Laws to make him aware. EKG sent.  No new orders at this time. PT stable.

## 2022-08-17 NOTE — Consult Note (Signed)
ANTICOAGULATION CONSULT NOTE  Pharmacy Consult for IV Heparin Indication: chest pain/ACS  Patient Measurements: Height: '5\' 3"'$  (160 cm) Weight: 67.1 kg (148 lb) IBW/kg (Calculated) : 52.4 Heparin Dosing Weight: 68.1 kg  Labs: Recent Labs    08/16/22 1357 08/16/22 1606 08/16/22 1957 08/16/22 2010 08/17/22 0514 08/17/22 0801 08/17/22 1345  HGB 13.8  --   --   --  12.7  --   --   HCT 42.3  --   --   --  38.9  --   --   PLT 322  --   --   --  271  --   --   APTT  --   --  27  --   --   --   --   LABPROT  --   --   --  13.2  --   --   --   INR  --   --   --  1.0  --   --   --   HEPARINUNFRC  --   --   --   --   --  0.37 0.33  CREATININE 0.77  --   --   --  0.77  --   --   TROPONINIHS 38* 44*  --   --   --   --   --      Estimated Creatinine Clearance: 63.7 mL/min (by C-G formula based on SCr of 0.77 mg/dL).   Medical History: Past Medical History:  Diagnosis Date   Borderline glaucoma    Cancer ([place])    skin cancer basal cell   Degenerative disc disease, lumbar    Gestational diabetes    Hyperlipemia    Right-sided carotid artery disease ([place])    echo done    Medications:  No anticoagulation prior to admission per my chart review  Assessment: Patient is a 66 y/o F with medical history as above who presented to the ED 9/11 with chest pain. Patient reported that pain was associated with radiation down her arms and neck, with DOE for 2 weeks. There is concern for NSTEMI. cTn slightly elevated, from 38 >> 44. LHC planned for today, 9/12. Pharmacy consulted to initiate and manage heparin infusion for suspected ACS. Hgb, Hct, and PLT stable.  Date Time HL Rate/Comment 9/12 0801 0.37 Therapeutic x 1 9/12 1345 0.33 Therapeutic x 2  Goal of Therapy:  Heparin level 0.3-0.7 units/ml Monitor platelets by anticoagulation protocol: Yes   Plan:  Therapeutic consecutively, daily monitoring of HL appropriate, however pt planned for [facility] 9/12 afternoon. Will follow up  discussion of [facility] plan post-procedure. Continue with heparin infusion at 800 units/hr if continued post-op. Check HL daily with AM labs Continue to monitor CBC daily while on heparin gtt  Lorna Dibble, PharmD, [facility] Clinical Pharmacist 08/17/2022 2:46 PM

## 2022-08-17 NOTE — Consult Note (Signed)
[place] for IV Heparin Indication: chest pain/ACS  Patient Measurements: Height: '5\' 3"'$  (160 cm) Weight: 67.1 kg (148 lb) IBW/kg (Calculated) : 52.4 Heparin Dosing Weight: 68.1 kg  Labs: Recent Labs    08/16/22 1357 08/16/22 1606 08/16/22 1957 08/16/22 2010 08/17/22 0514  HGB 13.8  --   --   --  12.7  HCT 42.3  --   --   --  38.9  PLT 322  --   --   --  271  APTT  --   --  27  --   --   LABPROT  --   --   --  13.2  --   INR  --   --   --  1.0  --   CREATININE 0.77  --   --   --  0.77  TROPONINIHS 38* 44*  --   --   --      Estimated Creatinine Clearance: 63.7 mL/min (by C-G formula based on SCr of 0.77 mg/dL).   Medical History: Past Medical History:  Diagnosis Date   Borderline glaucoma    Cancer ([place])    skin cancer basal cell   Degenerative disc disease, lumbar    Gestational diabetes    Hyperlipemia    Right-sided carotid artery disease ([place])    echo done    Medications:  No anticoagulation prior to admission per my chart review  Assessment: Patient is a 66 y/o F with medical history as above who presented to the ED 9/11 with chest pain. Patient reported that pain was associated with radiation down her arms and neck, with DOE for 2 weeks. There is concern for NSTEMI. cTn slightly elevated, from 38 >> 44. LHC planned for today, 9/12. Pharmacy consulted to initiate and manage heparin infusion for suspected ACS. Hgb, Hct, and PLT stable.  Date Time HL Rate/Comment 9/12 0801 0.37 Therapeutic x 1  Goal of Therapy:  Heparin level 0.3-0.7 units/ml Monitor platelets by anticoagulation protocol: Yes   Plan:  Continue heparin infusion at 800 units/hr Check HL in 6 hours Continue to monitor CBC daily while on heparin  Dara Hoyer, PharmD PGY-1 Pharmacy Resident 08/17/2022 8:16 AM

## 2022-08-17 NOTE — H&P (View-Only) (Signed)
Cardiology Consultation   Patient ID: Tammie Smith MRN: [id number]; DOB: Nov 19, 1956  Admit date: 08/16/2022 Date of Consult: 08/17/2022  PCP:  Tammie Ruths, MD   [facility] Providers Cardiologist:  None   {  Patient Profile:   Tammie Smith is a 66 y.o. female with a hx of HLD, DJD, DM2, carotid artery disease, esophagitis who is being seen 08/17/2022 for the evaluation of chest pain at the request of Tammie Smith.  History of Present Illness:   Tammie Smith was previously seen by Tammie Smith, last time in 2019 for palpitations. Unclear if she had a heart monitor.   She had a Myoview lexiscan in 2017 showing normal wall motion, LVEF 60%, and no ischemia. Echo in 2017 showed LVEF 55%, mild LVH, normal RV function, mild valvular regurgitation.   She denies alcohol, drug or tobacco history. H/o muscles aches on Crestor.   The patient presented to the ER 08/16/22 for chest pain. Started out with midsternal chest pain radiating into her back. 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 She is very stressed and felt stress may have been the culprit of the chest pain. However, chest pain continued to worsen. Over the last 2 weeks she noted worsening chest pain when mowing the yard. Also has noted very high blood pressures. One night, chest pain returned and lasted for about 4 hours. She didn't go tot he ER because she didn't have a caregiver for her husband. Last night her son came to look after her husband, so patient came to the ER. Husband was accepted into the hopsice house last night.   In the ER BP 150/93, pulse 80, afebrile, normal O2. L:abs showed HS trop 38>44. Na 140, K 4.1, bicarb 24, Scr 0.77, WBC 6.6, Hgb 13.8, plt 322. EKS showed NSR with no ischemic changes. CXR showed no active disease. CTA chest/ab/pelvis showed no acute dissection or aneurysm. She was given ASA '324mg'$ . She was started on IV heparin and admitted.   Past  Medical History:  Diagnosis Date   Borderline glaucoma    Cancer ([place])    skin cancer basal cell   Degenerative disc disease, lumbar    Gestational diabetes    Hyperlipemia    Right-sided carotid artery disease ([place])    echo done    Past Surgical History:  Procedure Laterality Date   ABDOMINAL HYSTERECTOMY     APPENDECTOMY     AUGMENTATION MAMMAPLASTY Bilateral 04/05/1984   Pt had implants removed last time   CHOLECYSTECTOMY     COLONOSCOPY WITH PROPOFOL N/A 01/12/2016   Procedure: COLONOSCOPY WITH PROPOFOL;  Surgeon: Lollie Sails, MD;  Location[facility] ENDOSCOPY;  Service: Endoscopy;  Laterality: N/A;   ESOPHAGOGASTRODUODENOSCOPY (EGD) WITH PROPOFOL N/A 09/18/2019   Procedure: ESOPHAGOGASTRODUODENOSCOPY (EGD) WITH PROPOFOL;  Surgeon: Lollie Sails, MD;  Location: [facility] ENDOSCOPY;  Service: Endoscopy;  Laterality: N/A;     Home Medications:  Prior to Admission medications   Medication Sig Start Date End Date Taking? Authorizing Provider  DULoxetine (CYMBALTA) 30 MG capsule Take 30 mg by mouth daily. 08/06/22  Yes [provider]  metFORMIN (GLUCOPHAGE-XR) 500 MG 24 hr tablet Take 500 mg by mouth daily with supper. 06/18/22  Yes [provider]  propranolol (INDERAL) 40 MG tablet Take 40 mg by mouth 2 (two) times daily. 08/06/22  Yes [provider]  RABEprazole (ACIPHEX) 20 MG tablet Take 20 mg by mouth at  bedtime.   Yes [provider]  CALCIUM CARB-VIT D-C-E-MINERAL PO Take by mouth. Patient not taking: Reported on 08/16/2022    [provider]  Cholecalciferol 1000 units tablet Take 1,000 Units by mouth daily. Patient not taking: Reported on 08/16/2022    [provider]  Astrid Drafts Omega-3 300 MG CAPS Take 1 tablet by mouth daily. Patient not taking: Reported on 08/16/2022    [provider]  Multiple Vitamin (MULTIVITAMIN) capsule Take 1 capsule by mouth daily. Patient not taking: Reported on 08/16/2022    [provider]  NIACIN PO Take 2,000 mg by mouth daily. Patient not taking: Reported on 08/16/2022    [provider]  Turmeric (QC TUMERIC COMPLEX PO) Take 1 tablet by mouth daily. Patient not taking: Reported on 08/16/2022    [provider]  Zinc Acetate, Oral, (ZINC ACETATE PO) Take 1 tablet by mouth daily. Patient not taking: Reported on 08/16/2022    [provider]    Inpatient Medications: Scheduled Meds:  aspirin EC  81 mg Oral Daily   DULoxetine  30 mg Oral Daily   insulin aspart  0-15 Units Subcutaneous TID AC & HS   pantoprazole  40 mg Oral QHS   propranolol  40 mg Oral BID   Continuous Infusions:  heparin 800 Units/hr (08/17/22 0054)   PRN Meds: acetaminophen **OR** acetaminophen, labetalol, morphine injection, nitroGLYCERIN, ondansetron **OR** ondansetron (ZOFRAN) IV, senna-docusate  Allergies:    Allergies  Allergen Reactions   Demerol [Meperidine] Nausea And Vomiting    Causes violent vomiting   Codeine Nausea And Vomiting   Crestor [Rosuvastatin]     Abdominal pain, muscle pain   Omeprazole     Abdominal pain, muscle pain    Social History:   Social History   Socioeconomic History   Marital status: Married    Spouse name: Not on file   Number of children: Not on file   Years of education: Not on file   Highest education level: Not on file  Occupational History   Not on file  Tobacco Use   Smoking status: Never   Smokeless tobacco: Never  Substance and Sexual Activity   Alcohol use: No   Drug use: Never   Sexual activity: Not Currently  Other Topics Concern   Not on file  Social History Narrative   Not on file   Social Determinants of Health   Financial Resource Strain: Not on file  Food Insecurity: No Food Insecurity (08/16/2022)   Hunger Vital Sign    Worried About Running Out of Food in the Last Year: Never true    Ran Out of Food in the Last Year: Never true  Transportation Needs: No Transportation Needs  (08/16/2022)   PRAPARE - Hydrologist (Medical): No    Lack of Transportation (Non-Medical): No  Physical Activity: Not on file  Stress: Not on file  Social Connections: Not on file  Intimate Partner Violence: Not At Risk (08/16/2022)   Humiliation, Afraid, Rape, and Kick questionnaire    Fear of Current or Ex-Partner: No    Emotionally Abused: No    Physically Abused: No    Sexually Abused: No    Family History:    Family History  Problem Relation Age of Onset   Breast cancer Neg Hx      ROS:  Please see the history of present illness.   All other ROS reviewed and negative.     Physical  Exam/Data:   Vitals:   08/16/22 2132 08/17/22 0041 08/17/22 0450 08/17/22 0737  BP: 132/82 110/60 119/75 125/82  Pulse: 70 66 72 74  Resp: '19 18 18 16  '$ Temp: 98.6 F (37 C) 98.2 F (36.8 C) 98.1 F (36.7 C) 98.3 F (36.8 C)  TempSrc: Oral Oral Oral Oral  SpO2: 98% 96% 98% 99%  Weight: 67.1 kg     Height: '5\' 3"'$  (1.6 m)       Intake/Output Summary (Last 24 hours) at 08/17/2022 0841 Last data filed at 08/17/2022 0825 Gross per 24 hour  Intake 56.76 ml  Output --  Net 56.76 ml      08/16/2022    9:32 PM 08/16/2022    1:54 PM 03/05/2021    1:40 PM  Last 3 Weights  Weight (lbs) 148 lb 163 lb 2.3 oz 163 lb  Weight (kg) 67.132 kg 74 kg 73.936 kg     Body mass index is 26.22 kg/m.  General:  Well nourished, well developed, in no acute distress HEENT: normal Neck: no JVD Vascular: No carotid bruits; Distal pulses 2+ bilaterally Cardiac:  normal S1, S2; RRR; no murmur  Lungs:  clear to auscultation bilaterally, no wheezing, rhonchi or rales  Abd: soft, nontender, no hepatomegaly  Ext: no edema Musculoskeletal:  No deformities, BUE and BLE strength normal and equal Skin: warm and dry  Neuro:  CNs 2-12 intact, no focal abnormalities noted Psych:  Normal affect   EKG:  The EKG was personally reviewed and demonstrates:  NSR 78bpm, no ischemic  changes Telemetry:  Telemetry was personally reviewed and demonstrates:  Nsr HR 70  Relevant CV Studies:  Echo 2017 INTERPRETATION  NORMAL LEFT VENTRICULAR SYSTOLIC FUNCTION   WITH MILD LVH  NORMAL RIGHT VENTRICULAR SYSTOLIC FUNCTION  MILD VALVULAR REGURGITATION (See above)  NO VALVULAR STENOSIS  EF >55%   Myoview Lexiscan   FINDINGS:  Regional wall motion:  reveals normal myocardial thickening and wall  motion.  The overall quality of the study is good.    Artifacts noted: no  Left ventricular cavity: normal.   Perfusion Analysis:  SPECT images demonstrate homogeneous tracer  distribution throughout the myocardium.    Laboratory Data:  High Sensitivity Troponin:   Recent Labs  Lab 08/16/22 1357 08/16/22 1606  TROPONINIHS 38* 44*     Chemistry Recent Labs  Lab 08/16/22 1357 08/17/22 0514  NA 140 141  K 4.1 3.8  CL 105 108  CO2 24 23  GLUCOSE 125* 127*  BUN 14 17  CREATININE 0.77 0.77  CALCIUM 9.7 8.4*  GFRNONAA >60 >60  ANIONGAP 11 10    No results for input(s): "PROT", "ALBUMIN", "AST", "ALT", "ALKPHOS", "BILITOT" in the last 168 hours. Lipids No results for input(s): "CHOL", "TRIG", "HDL", "LABVLDL", "LDLCALC", "CHOLHDL" in the last 168 hours.  Hematology Recent Labs  Lab 08/16/22 1357 08/17/22 0514  WBC 6.6 5.2  RBC 4.74 4.34  HGB 13.8 12.7  HCT 42.3 38.9  MCV 89.2 89.6  MCH 29.1 29.3  MCHC 32.6 32.6  RDW 13.0 13.0  PLT 322 271   Thyroid No results for input(s): "TSH", "FREET4" in the last 168 hours.  BNP Recent Labs  Lab 08/16/22 1956  BNP 157.4*    DDimer No results for input(s): "DDIMER" in the last 168 hours.   Radiology/Studies:  CT Angio Chest/Abd/Pel for Dissection W and/or W/WO  Result Date: 08/16/2022 CLINICAL DATA:  Chest pain radiating to the EXAM: CT ANGIOGRAPHY CHEST, ABDOMEN AND PELVIS TECHNIQUE:  Non-contrast CT of the chest was initially obtained. Multidetector CT imaging through the chest, abdomen and pelvis was  performed using the standard protocol during bolus administration of intravenous contrast. Multiplanar reconstructed images and MIPs were obtained and reviewed to evaluate the vascular anatomy. RADIATION DOSE REDUCTION: This exam was performed according to the departmental dose-optimization program which includes automated exposure control, adjustment of the mA and/or kV according to patient size and/or use of iterative reconstruction technique. CONTRAST:  17m OMNIPAQUE IOHEXOL 350 MG/ML SOLN COMPARISON:  Chest x-ray 08/16/2022 FINDINGS: CTA CHEST FINDINGS Cardiovascular: Non contrasted images of the chest demonstrate no acute intramural hematoma. Mild aortic atherosclerosis. No aneurysm. Coronary vascular calcification. No dissection is seen. Normal cardiac size. No pericardial effusion Mediastinum/Nodes: No enlarged mediastinal, hilar, or axillary lymph nodes. Thyroid gland, trachea, and esophagus demonstrate no significant findings. Lungs/Pleura: Lungs are clear. No pleural effusion or pneumothorax. Musculoskeletal: No chest wall abnormality. No acute or significant osseous findings. Review of the MIP images confirms the above findings. CTA ABDOMEN AND PELVIS FINDINGS VASCULAR Aorta: Normal caliber aorta without aneurysm, dissection, vasculitis or significant stenosis. Mild aortic atherosclerosis. Celiac: Patent without evidence of aneurysm, dissection, vasculitis or significant stenosis. SMA: Patent without evidence of aneurysm, dissection, vasculitis or significant stenosis. Renals: Both renal arteries are patent without evidence of aneurysm, dissection, vasculitis, fibromuscular dysplasia or significant stenosis. IMA: Patent without evidence of aneurysm, dissection, vasculitis or significant stenosis. Inflow: Patent without evidence of aneurysm, dissection, vasculitis or significant stenosis. Review of the MIP images confirms the above findings. NON-VASCULAR Hepatobiliary: No focal liver abnormality is seen.  Status post cholecystectomy. No biliary dilatation. Pancreas: Unremarkable. No pancreatic ductal dilatation or surrounding inflammatory changes. Spleen: Normal in size without focal abnormality. Adrenals/Urinary Tract: Adrenal glands are unremarkable. Kidneys are normal, without renal calculi, focal lesion, or hydronephrosis. Bladder is unremarkable. Stomach/Bowel: Stomach is within normal limits. Appendix not well seen but no right lower quadrant inflammation. No evidence of bowel wall thickening, distention, or inflammatory changes. Lymphatic: No suspicious lymph nodes Reproductive: Status post hysterectomy. No adnexal masses. Other: No abdominal wall hernia or abnormality. No abdominopelvic ascites. Musculoskeletal: No acute or significant osseous findings. Review of the MIP images confirms the above findings. IMPRESSION: 1. Negative for acute aortic dissection or aneurysm. 2. Mild atherosclerosis without significant stenosis or acute occlusive disease. 3. No CT evidence for acute intra-abdominal or pelvic abnormality. Electronically Signed   By: KDonavan FoilM.D.   On: 08/16/2022 20:53   DG Chest 2 View  Result Date: 08/16/2022 CLINICAL DATA:  Chest pain. EXAM: CHEST - 2 VIEW COMPARISON:  Chest x-ray January 20, 2013. FINDINGS: The heart size and mediastinal contours are within normal limits. Both lungs are clear. No visible pleural effusions or pneumothorax. No acute osseous abnormality. Right upper quadrant clips. IMPRESSION: No active cardiopulmonary disease. Electronically Signed   By: FMargaretha SheffieldM.D.   On: 08/16/2022 14:39     Assessment and Plan:   NSTEMI -Presented with progressive exertional chest pain. High-sensitivity troponin minimally elevated 44.  EKG with no ischemic changes.  Given aspirin 324 and started on IV heparin. -CTA of chest abdomen pelvis negative for dissection -Patient had a normal [place] 2017 -Risk factors include hyperlipidemia, diabetes,  hypertension -Patient currently reports dull chest pain -Echo ordered -Continue to trend troponin -Continue aspirin.  PTA propranolol. -She has a history of myalgia on Crestor -She is NPO.  Plan for left heart cath today Risks and benefits of cardiac catheterization have been discussed with the  patient.  These include bleeding, infection, kidney damage, stroke, heart attack, death.  The patient understands these risks and is willing to proceed.   Hyperlipidemia -PTA niacin -History of myalgia on Crestor, may need to consider PCSK9i as outpatient -Check fasting lipid panel  Hypertension -PTA propranolol 40 mg twice daily -BP improved  For questions or updates, please contact [place] Please consult [email or web address] for contact info under    Signed, Flynt Breeze Ninfa Meeker, PA-C  08/17/2022 8:41 AM

## 2022-08-18 ENCOUNTER — Encounter: Payer: Self-pay | Admitting: Internal Medicine

## 2022-08-18 DIAGNOSIS — E663 Overweight: Secondary | ICD-10-CM

## 2022-08-18 DIAGNOSIS — I5032 Chronic diastolic (congestive) heart failure: Secondary | ICD-10-CM | POA: Diagnosis not present

## 2022-08-18 DIAGNOSIS — I214 Non-ST elevation (NSTEMI) myocardial infarction: Secondary | ICD-10-CM | POA: Diagnosis not present

## 2022-08-18 DIAGNOSIS — E118 Type 2 diabetes mellitus with unspecified complications: Secondary | ICD-10-CM | POA: Diagnosis not present

## 2022-08-18 DIAGNOSIS — E782 Mixed hyperlipidemia: Secondary | ICD-10-CM

## 2022-08-18 LAB — BASIC METABOLIC PANEL
Anion gap: 7 (ref 5–15)
BUN: 14 mg/dL (ref 8–23)
CO2: 22 mmol/L (ref 22–32)
Calcium: 8.7 mg/dL — ABNORMAL LOW (ref 8.9–10.3)
Chloride: 109 mmol/L (ref 98–111)
Creatinine, Ser: 0.75 mg/dL (ref 0.44–1.00)
GFR, Estimated: >60 mL/min (ref >60)
Glucose, Bld: 131 mg/dL — ABNORMAL HIGH (ref 70–99)
Potassium: 4.2 mmol/L (ref 3.5–5.1)
Sodium: 138 mmol/L (ref 135–145)

## 2022-08-18 LAB — CBC
HCT: 37 % (ref 36.0–46.0)
Hemoglobin: 12.1 g/dL (ref 12.0–15.0)
MCH: 29.3 pg (ref 26.0–34.0)
MCHC: 32.7 g/dL (ref 30.0–36.0)
MCV: 89.6 fL (ref 80.0–100.0)
Platelets: 263 10*3/uL (ref 150–400)
RBC: 4.13 MIL/uL (ref 3.87–5.11)
RDW: 13.4 % (ref 11.5–15.5)
WBC: 5.1 10*3/uL (ref 4.0–10.5)
nRBC: 0 % (ref 0.0–0.2)

## 2022-08-18 LAB — LIPID PANEL
Cholesterol: 248 mg/dL — ABNORMAL HIGH (ref 0–200)
HDL: 37 mg/dL — ABNORMAL LOW (ref >40)
LDL Cholesterol: 167 mg/dL — ABNORMAL HIGH (ref 0–99)
Total CHOL/HDL Ratio: 6.7 RATIO
Triglycerides: 219 mg/dL — ABNORMAL HIGH (ref <150)
VLDL: 44 mg/dL — ABNORMAL HIGH (ref 0–40)

## 2022-08-18 LAB — GLUCOSE, CAPILLARY
Glucose-Capillary: 135 mg/dL — ABNORMAL HIGH (ref 70–99)
Glucose-Capillary: 173 mg/dL — ABNORMAL HIGH (ref 70–99)

## 2022-08-18 LAB — HIV ANTIBODY (ROUTINE TESTING W REFLEX): HIV Screen 4th Generation wRfx: NONREACTIVE

## 2022-08-18 MED ORDER — TICAGRELOR 90 MG PO TABS: 90.0000 mg | ORAL_TABLET | Freq: Two times a day (BID) | ORAL | 11 refills | Status: DC

## 2022-08-18 MED ORDER — ATORVASTATIN CALCIUM 40 MG PO TABS: 40.0000 mg | ORAL_TABLET | Freq: Every day | ORAL | 2 refills | Status: DC

## 2022-08-18 MED ORDER — ISOSORBIDE MONONITRATE ER 30 MG PO TB24: 15.0000 mg | ORAL_TABLET | Freq: Every day | ORAL | 1 refills | Status: DC

## 2022-08-18 MED ORDER — ADULT MULTIVITAMIN W/MINERALS CH
1.0000 | ORAL_TABLET | Freq: Every day | ORAL | Status: DC
  Administered 2022-08-18: 1 via ORAL
  Filled 2022-08-18: qty 1

## 2022-08-18 MED ORDER — GLUCERNA SHAKE PO LIQD: 237.0000 mL | Freq: Three times a day (TID) | ORAL | 0 refills | Status: DC

## 2022-08-18 MED ORDER — GLUCERNA SHAKE PO LIQD: 237.0000 mL | Freq: Three times a day (TID) | ORAL | Status: DC

## 2022-08-18 MED ORDER — ASPIRIN 81 MG PO TBEC: 81.0000 mg | DELAYED_RELEASE_TABLET | Freq: Every day | ORAL | 12 refills | Status: DC

## 2022-08-18 NOTE — Assessment & Plan Note (Signed)
Echocardiogram noted grade 1 diastolic dysfunction.  Euvolemic.

## 2022-08-18 NOTE — Assessment & Plan Note (Signed)
Meets criteria for BMI greater than 25 

## 2022-08-18 NOTE — Progress Notes (Signed)
Rounding Note    Patient Name: Tammie Smith Date of Encounter: 08/18/2022  [facility] Cardiologist: Dr. Saunders Revel  Subjective   Denies chest pain, ambulated to bathroom without any adverse effects.  Excited about going home today.  Inpatient Medications    Scheduled Meds:  aspirin EC  81 mg Oral Daily   atorvastatin  40 mg Oral Daily   DULoxetine  30 mg Oral Daily   enoxaparin (LOVENOX) injection  40 mg Subcutaneous Q24H   insulin aspart  0-15 Units Subcutaneous TID AC & HS   isosorbide mononitrate  15 mg Oral Daily   pantoprazole  40 mg Oral QHS   propranolol  40 mg Oral BID   sodium chloride flush  3 mL Intravenous Q12H   sodium chloride flush  3 mL Intravenous Q12H   ticagrelor  90 mg Oral BID   Continuous Infusions:  sodium chloride     PRN Meds: sodium chloride, acetaminophen **OR** acetaminophen, labetalol, morphine injection, ondansetron **OR** ondansetron (ZOFRAN) IV, senna-docusate, sodium chloride flush   Vital Signs    Vitals:   08/17/22 2210 08/18/22 0439 08/18/22 0442 08/18/22 0834  BP: 138/72 (!) 100/59 107/60 121/67  Pulse: 76 66  71  Resp: '20 18  16  '$ Temp: 98.7 F (37.1 C) 97.8 F (36.6 C)  98.9 F (37.2 C)  TempSrc: Oral Oral  Oral  SpO2: 97% 99%  97%  Weight:      Height:        Intake/Output Summary (Last 24 hours) at 08/18/2022 1025 Last data filed at 08/18/2022 0400 Gross per 24 hour  Intake 280 ml  Output --  Net 280 ml      08/16/2022    9:32 PM 08/16/2022    1:54 PM 03/05/2021    1:40 PM  Last 3 Weights  Weight (lbs) 148 lb 163 lb 2.3 oz 163 lb  Weight (kg) 67.132 kg 74 kg 73.936 kg      Telemetry    Sinus rhythm, heart rate 72- Personally Reviewed  ECG     - Personally Reviewed  Physical Exam   GEN: No acute distress.   Neck: No JVD Cardiac: RRR, no murmurs, rubs, or gallops.  Respiratory: Clear to auscultation bilaterally. GI: Soft, nontender, non-distended  MS: No edema; No deformity. Neuro:  Nonfocal   Psych: Normal affect   Labs    High Sensitivity Troponin:   Recent Labs  Lab 08/16/22 1357 08/16/22 1606  TROPONINIHS 38* 44*     Chemistry Recent Labs  Lab 08/16/22 1357 08/17/22 0514 08/18/22 0717  NA 140 141 138  K 4.1 3.8 4.2  CL 105 108 109  CO2 '24 23 22  '$ GLUCOSE 125* 127* 131*  BUN '14 17 14  '$ CREATININE 0.77 0.77 0.75  CALCIUM 9.7 8.4* 8.7*  GFRNONAA >60 >60 >60  ANIONGAP '11 10 7    '$ Lipids  Recent Labs  Lab 08/18/22 0717  CHOL 248*  TRIG 219*  HDL 37*  LDLCALC 167*  CHOLHDL 6.7    Hematology Recent Labs  Lab 08/16/22 1357 08/17/22 0514 08/18/22 0717  WBC 6.6 5.2 5.1  RBC 4.74 4.34 4.13  HGB 13.8 12.7 12.1  HCT 42.3 38.9 37.0  MCV 89.2 89.6 89.6  MCH 29.1 29.3 29.3  MCHC 32.6 32.6 32.7  RDW 13.0 13.0 13.4  PLT 322 271 263   Thyroid No results for input(s): "TSH", "FREET4" in the last 168 hours.  BNP Recent Labs  Lab 08/16/22 1956  BNP 157.4*    DDimer No results for input(s): "DDIMER" in the last 168 hours.   Radiology    CARDIAC CATHETERIZATION  Result Date: 08/17/2022 Conclusions: Severe three-vessel coronary artery disease, including long segment of calcified mid LAD disease of up to 80%, 50% distal LAD stenosis, subtotal occlusion (99% with TIMI-1 flow) of OM1, and 70% proximal stenosis of small, non-dominant RCA. Mildly elevated left ventricular filling pressure (LVEDP 17 mmHg). Successful PCI to OM1 using overlapping Onyx Frontier 2.25 x 22 mm and 2.0 x 12 mm drug-eluting stents with 0% residual stenosis and TIMI-3.  Initial stent placement was complicated by distal edge dissection successful treated with overlapping stent at the distal margin. Recommendations: Dual antiplatelet therapy with aspirin and ticagrelor for at least 12 months. Aggressive secondary prevention.  We have agreed to rechallenge Ms. Mane with statin therapy. Plan for medical management of LAD disease with further discussion of staged PCI to be done at follow-up.   We will continue recently added propranolol and also start isosorbide mononitrate. Outpatient cardiac rehab referral has been placed. Nelva Bush, MD [facility]  ECHOCARDIOGRAM COMPLETE  Result Date: 08/17/2022    ECHOCARDIOGRAM REPORT   Patient Name:   Tammie Smith Date of Exam: 08/17/2022 Medical Rec #:  [id number]       Height:       63.0 in Accession #:    [id number]      Weight:       148.0 lb Date of Birth:  March 20, 1956       BSA:          1.701 m Patient Age:    66 years        BP:           125/82 mmHg Patient Gender: F               HR:           60 bpm. Exam Location:  [facility] Procedure: 2D Echo, Cardiac Doppler, Color Doppler and Intracardiac            Opacification Agent Indications:     R06.00 Dyspnea                  R07.9 Chest pain                  Elevated Troponin  History:         Patient has no prior history of Echocardiogram examinations.                  Risk Factors:Hypertension, Diabetes and Dyslipidemia.  Sonographer:     Rosalia Hammers Referring Phys:  [id number] AMY N COX Diagnosing Phys: Nelva Bush MD IMPRESSIONS  1. Left ventricular ejection fraction, by estimation, is 55 to 60%. The left ventricle has normal function. The left ventricle demonstrates regional wall motion abnormalities (see scoring diagram/findings for description). There is mild left ventricular  hypertrophy. Left ventricular diastolic parameters are consistent with Grade I diastolic dysfunction (impaired relaxation). Elevated left atrial pressure. There is moderate hypokinesis of the left ventricular, basal-mid inferolateral wall.  2. Right ventricular systolic function is normal. The right ventricular size is normal. Tricuspid regurgitation signal is inadequate for assessing PA pressure.  3. The mitral valve is normal in structure. Trivial mitral valve regurgitation.  4. The aortic valve is tricuspid. There is mild thickening of the aortic valve. Aortic valve regurgitation is mild.  5. The inferior vena cava  is normal in size  with <50% respiratory variability, suggesting right atrial pressure of 8 mmHg. FINDINGS  Left Ventricle: Left ventricular ejection fraction, by estimation, is 55 to 60%. The left ventricle has normal function. The left ventricle demonstrates regional wall motion abnormalities. Moderate hypokinesis of the left ventricular, basal-mid inferolateral wall. Definity contrast agent was given IV to delineate the left ventricular endocardial borders. The left ventricular internal cavity size was normal in size. There is mild left ventricular hypertrophy. Left ventricular diastolic parameters are consistent with Grade I diastolic dysfunction (impaired relaxation). Elevated left atrial pressure. Right Ventricle: The right ventricular size is normal. No increase in right ventricular wall thickness. Right ventricular systolic function is normal. Tricuspid regurgitation signal is inadequate for assessing PA pressure. Left Atrium: Left atrial size was normal in size. Right Atrium: Right atrial size was normal in size. Pericardium: There is no evidence of pericardial effusion. Mitral Valve: The mitral valve is normal in structure. Trivial mitral valve regurgitation. Tricuspid Valve: The tricuspid valve is normal in structure. Tricuspid valve regurgitation is trivial. Aortic Valve: The aortic valve is tricuspid. There is mild thickening of the aortic valve. Aortic valve regurgitation is mild. Aortic regurgitation PHT measures 455 msec. Aortic valve mean gradient measures 4.0 mmHg. Aortic valve peak gradient measures 6.2 mmHg. Aortic valve area, by VTI measures 1.76 cm. Pulmonic Valve: The pulmonic valve was normal in structure. Pulmonic valve regurgitation is trivial. No evidence of pulmonic stenosis. Aorta: The aortic root and ascending aorta are structurally normal, with no evidence of dilitation. Pulmonary Artery: The pulmonary artery is of normal size. Venous: The inferior vena cava is normal in size with less  than 50% respiratory variability, suggesting right atrial pressure of 8 mmHg. IAS/Shunts: No atrial level shunt detected by color flow Doppler.  LEFT VENTRICLE PLAX 2D LVIDd:         4.16 cm   Diastology LVIDs:         2.43 cm   LV e' medial:    6.09 cm/s LV PW:         1.28 cm   LV E/e' medial:  15.4 LV IVS:        1.25 cm   LV e' lateral:   6.96 cm/s LVOT diam:     1.60 cm   LV E/e' lateral: 13.4 LV SV:         50 LV SV Index:   30 LVOT Area:     2.01 cm  RIGHT VENTRICLE RV Basal diam:  2.55 cm RV S prime:     10.70 cm/s TAPSE (M-mode): 1.8 cm LEFT ATRIUM             Index        RIGHT ATRIUM           Index LA diam:        3.30 cm 1.94 cm/m   RA Area:     12.60 cm LA Vol (A2C):   24.3 ml 14.28 ml/m  RA Volume:   27.50 ml  16.16 ml/m LA Vol (A4C):   25.3 ml 14.87 ml/m LA Biplane Vol: 26.6 ml 15.63 ml/m  AORTIC VALVE AV Area (Vmax):    1.77 cm AV Area (Vmean):   1.62 cm AV Area (VTI):     1.76 cm AV Vmax:           124.00 cm/s AV Vmean:          90.300 cm/s AV VTI:  0.286 m AV Peak Grad:      6.2 mmHg AV Mean Grad:      4.0 mmHg LVOT Vmax:         109.00 cm/s LVOT Vmean:        72.800 cm/s LVOT VTI:          0.251 m LVOT/AV VTI ratio: 0.88 AI PHT:            455 msec  AORTA Ao Root diam: 3.30 cm MITRAL VALVE MV Area (PHT): 3.48 cm     SHUNTS MV Decel Time: 218 msec     Systemic VTI:  0.25 m MV E velocity: 93.60 cm/s   Systemic Diam: 1.60 cm MV A velocity: 115.00 cm/s MV E/A ratio:  0.81 Harrell Gave End MD Electronically signed by Nelva Bush MD Signature Date/Time: 08/17/2022/2:13:43 PM    Final    CT Angio Chest/Abd/Pel for Dissection W and/or W/WO  Result Date: 08/16/2022 CLINICAL DATA:  Chest pain radiating to the EXAM: CT ANGIOGRAPHY CHEST, ABDOMEN AND PELVIS TECHNIQUE: Non-contrast CT of the chest was initially obtained. Multidetector CT imaging through the chest, abdomen and pelvis was performed using the standard protocol during bolus administration of intravenous contrast.  Multiplanar reconstructed images and MIPs were obtained and reviewed to evaluate the vascular anatomy. RADIATION DOSE REDUCTION: This exam was performed according to the departmental dose-optimization program which includes automated exposure control, adjustment of the mA and/or kV according to patient size and/or use of iterative reconstruction technique. CONTRAST:  126m OMNIPAQUE IOHEXOL 350 MG/ML SOLN COMPARISON:  Chest x-ray 08/16/2022 FINDINGS: CTA CHEST FINDINGS Cardiovascular: Non contrasted images of the chest demonstrate no acute intramural hematoma. Mild aortic atherosclerosis. No aneurysm. Coronary vascular calcification. No dissection is seen. Normal cardiac size. No pericardial effusion Mediastinum/Nodes: No enlarged mediastinal, hilar, or axillary lymph nodes. Thyroid gland, trachea, and esophagus demonstrate no significant findings. Lungs/Pleura: Lungs are clear. No pleural effusion or pneumothorax. Musculoskeletal: No chest wall abnormality. No acute or significant osseous findings. Review of the MIP images confirms the above findings. CTA ABDOMEN AND PELVIS FINDINGS VASCULAR Aorta: Normal caliber aorta without aneurysm, dissection, vasculitis or significant stenosis. Mild aortic atherosclerosis. Celiac: Patent without evidence of aneurysm, dissection, vasculitis or significant stenosis. SMA: Patent without evidence of aneurysm, dissection, vasculitis or significant stenosis. Renals: Both renal arteries are patent without evidence of aneurysm, dissection, vasculitis, fibromuscular dysplasia or significant stenosis. IMA: Patent without evidence of aneurysm, dissection, vasculitis or significant stenosis. Inflow: Patent without evidence of aneurysm, dissection, vasculitis or significant stenosis. Review of the MIP images confirms the above findings. NON-VASCULAR Hepatobiliary: No focal liver abnormality is seen. Status post cholecystectomy. No biliary dilatation. Pancreas: Unremarkable. No pancreatic  ductal dilatation or surrounding inflammatory changes. Spleen: Normal in size without focal abnormality. Adrenals/Urinary Tract: Adrenal glands are unremarkable. Kidneys are normal, without renal calculi, focal lesion, or hydronephrosis. Bladder is unremarkable. Stomach/Bowel: Stomach is within normal limits. Appendix not well seen but no right lower quadrant inflammation. No evidence of bowel wall thickening, distention, or inflammatory changes. Lymphatic: No suspicious lymph nodes Reproductive: Status post hysterectomy. No adnexal masses. Other: No abdominal wall hernia or abnormality. No abdominopelvic ascites. Musculoskeletal: No acute or significant osseous findings. Review of the MIP images confirms the above findings. IMPRESSION: 1. Negative for acute aortic dissection or aneurysm. 2. Mild atherosclerosis without significant stenosis or acute occlusive disease. 3. No CT evidence for acute intra-abdominal or pelvic abnormality. Electronically Signed   By: KDonavan FoilM.D.   On: 08/16/2022 20:53  DG Chest 2 View  Result Date: 08/16/2022 CLINICAL DATA:  Chest pain. EXAM: CHEST - 2 VIEW COMPARISON:  Chest x-ray January 20, 2013. FINDINGS: The heart size and mediastinal contours are within normal limits. Both lungs are clear. No visible pleural effusions or pneumothorax. No acute osseous abnormality. Right upper quadrant clips. IMPRESSION: No active cardiopulmonary disease. Electronically Signed   By: Margaretha Sheffield M.D.   On: 08/16/2022 14:39    Cardiac Studies   TTE 08/17/2022 1. Left ventricular ejection fraction, by estimation, is 55 to 60%. The  left ventricle has normal function. The left ventricle demonstrates  regional wall motion abnormalities (see scoring diagram/findings for  description). There is mild left ventricular   hypertrophy. Left ventricular diastolic parameters are consistent with  Grade I diastolic dysfunction (impaired relaxation). Elevated left atrial  pressure. There  is moderate hypokinesis of the left ventricular, basal-mid  inferolateral wall.   2. Right ventricular systolic function is normal. The right ventricular  size is normal. Tricuspid regurgitation signal is inadequate for assessing  PA pressure.   3. The mitral valve is normal in structure. Trivial mitral valve  regurgitation.   4. The aortic valve is tricuspid. There is mild thickening of the aortic  valve. Aortic valve regurgitation is mild.   5. The inferior vena cava is normal in size with <50% respiratory  variability, suggesting right atrial pressure of 8 mmHg.   [facility] 08/17/2022 Conclusions: Severe three-vessel coronary artery disease, including long segment of calcified mid LAD disease of up to 80%, 50% distal LAD stenosis, subtotal occlusion (99% with TIMI-1 flow) of OM1, and 70% proximal stenosis of small, non-dominant RCA. Mildly elevated left ventricular filling pressure (LVEDP 17 mmHg). Successful PCI to OM1 using overlapping Onyx Frontier 2.25 x 22 mm and 2.0 x 12 mm drug-eluting stents with 0% residual stenosis and TIMI-3.  Initial stent placement was complicated by distal edge dissection successful treated with overlapping stent at the distal margin.   Recommendations: Dual antiplatelet therapy with aspirin and ticagrelor for at least 12 months. Aggressive secondary prevention.  We have agreed to rechallenge Ms. Free with statin therapy. Plan for medical management of LAD disease with further discussion of staged PCI to be done at follow-up.  We will continue recently added propranolol and also start isosorbide mononitrate. Outpatient cardiac rehab referral has been placed.  Patient Profile     66 y.o. female with history of hyperlipidemia, diabetes presenting with chest pain, diagnosed with NSTEMI.  Underwent left heart cath showing three-vessel disease s/p drug-eluting stent to OM1  Assessment & Plan    Chest pain, CAD three-vessel, s/p drug-eluting stent to OM1 -LAD  with 85% mid stenosis, RCA small nondominant artery, 70% stenosis. -Denies chest pain, continue aspirin, Brilinta, Lipitor. -Continue propranolol, added isosorbide mononitrate. -Echo with normal EF -Follow-up as outpatient, if chest pain persist, will consider staged PCI to LAD.  2.  Hyperlipidemia -Lipitor 40 mg daily.  Patient can be discharged from a cardiac perspective on current medications.  Close follow-up with Dr. Saunders Revel or APP as outpatient.  Staged PCI to LAD may be considered if patient has recurrent angina.  Total encounter time more than 50 minutes  Greater than 50% was spent in counseling and coordination of care with the patient     Signed, Kate Sable, MD  08/18/2022, 10:25 AM

## 2022-08-18 NOTE — Assessment & Plan Note (Signed)
-   Continue home medications 

## 2022-08-18 NOTE — Progress Notes (Signed)
Mobility Specialist - Progress Note   08/18/22 1011  Mobility  Activity Ambulated independently in hallway;Stood at bedside;Dangled on edge of bed  Level of Assistance Independent  Assistive Device None  Distance Ambulated (ft) 640 ft  Activity Response Tolerated well  $Mobility charge 1 Mobility   Pt sitting upright in bed on RA. Pt STS and ambulates in hallway indep. Pt returns to bed with needs in reach and son in room.   Gretchen Short  Mobility Specialist  08/18/22 10:12 AM

## 2022-08-18 NOTE — Discharge Summary (Signed)
Physician Discharge Summary   Patient: Tammie Smith MRN: [id number] DOB: 1956/01/27  Admit date:     08/16/2022  Discharge date: 08/18/22  Discharge Physician: Annita Brod   PCP: Kirk Ruths, MD   Recommendations at discharge:   New medication: Brilinta 90 mg p.o. twice daily x12 months New medication: Aspirin 81 mg p.o. daily New medication: Lipitor 40 mg p.o. daily New medication: Imdur 15 mg p.o. daily Patient will follow-up with cardiology in the next 1 to 2 weeks.  They will call her to schedule appointment  Discharge Diagnoses: Principal Problem:   NSTEMI (non-ST elevated myocardial infarction) ([place]) Active Problems:   Chronic diastolic CHF (congestive heart failure) ([place])   Essential hypertension   Depression   GERD (gastroesophageal reflux disease)   Controlled type 2 diabetes mellitus with complication, without long-term current use of insulin (HCC)   DDD (degenerative disc disease), lumbar   Hyperlipidemia   Right-sided carotid artery disease (HCC)   Overweight (BMI 25.0-29.9)  Resolved Problems:   * No resolved hospital problems. *  Hospital Course: 66 year old female with past medical history of hypertension and diabetes mellitus type 2 presented to the emergency room on 9/11 with chest pain.  Initial cardiac markers were elevated and continued to trend upwards and patient's chest pain continued to progress, concerning for non-STEMI.  Patient was seen by cardiology and then taken on evening of 9/12 for cardiac catheterization.  There she was found to have severe two-vessel CAD with 99% OM 1 stenosis and multifocal LAD disease moderate to severe calcification up to 80% stenosis.  Patient underwent successful PCI and started on dual antiplatelet therapy with aspirin and Brilinta x12 months.  Follow-up echocardiogram noted preserved ejection fraction and grade 1 diastolic dysfunction.  Assessment and Plan: * NSTEMI (non-ST elevated myocardial  infarction) ([place]) Patient found to have severe three-vessel coronary artery disease including long segment of calcified mid LAD up to 80%, 50% distal LAD stenosis and subtotal occlusion 99% of OM1 +70% proximal stenosis of small nondominant RCA.  She underwent successful PCI to OM1 with 0% residual stenosis.  Initial stent placement complicated by distal edge dissection successfully treated with overlapping stent at the distal margin.  Patient started on dual antiplatelet therapy with aspirin and Brilinta x12 months plus aggressive secondary prevention with statin and Imdur.  Plan is for medical management of LAD disease with further discussion of staged PCI to be done at follow-up.  Outpatient cardiac rehab referral placed.  Patient will follow-up with cardiology in the next few weeks.  Chronic diastolic CHF (congestive heart failure) (HCC) Echocardiogram noted grade 1 diastolic dysfunction.  Euvolemic.  Essential hypertension Continue recently started beta-blocker, have added Imdur.  Depression - Duloxetine 30 mg daily resumed  GERD (gastroesophageal reflux disease) - PPI equivalent resumed  Overweight (BMI 25.0-29.9) Meets criteria for BMI greater than 25  Controlled type 2 diabetes mellitus with complication, without long-term current use of insulin ([place]) Continue home medications.         Consultants: Cardiology Procedures performed:  -Echocardiogram noting grade 1 diastolic dysfunction and preserved ejection fraction -Cardiac catheterization with PCI  Disposition: Home Diet recommendation:  Discharge Diet Orders (From admission, onward)     Start     Ordered   08/18/22 0000  Diet - low sodium heart healthy        08/18/22 1300           Cardiac diet DISCHARGE MEDICATION: Allergies as of 08/18/2022  Reactions   Demerol [meperidine] Nausea And Vomiting   Causes violent vomiting   Codeine Nausea And Vomiting   Crestor [rosuvastatin]    Abdominal pain,  muscle pain   Omeprazole    Abdominal pain, muscle pain        Medication List     TAKE these medications    aspirin EC 81 MG tablet Take 1 tablet (81 mg total) by mouth daily. Swallow whole. Start taking on: August 19, 2022   atorvastatin 40 MG tablet Commonly known as: LIPITOR Take 1 tablet (40 mg total) by mouth daily. Start taking on: August 19, 2022   DULoxetine 30 MG capsule Commonly known as: CYMBALTA Take 30 mg by mouth daily.   feeding supplement (GLUCERNA SHAKE) Liqd Take 237 mLs by mouth 3 (three) times daily between meals.   isosorbide mononitrate 30 MG 24 hr tablet Commonly known as: IMDUR Take 0.5 tablets (15 mg total) by mouth daily. Start taking on: August 19, 2022   metFORMIN 500 MG 24 hr tablet Commonly known as: GLUCOPHAGE-XR Take 500 mg by mouth daily with supper.   propranolol 40 MG tablet Commonly known as: INDERAL Take 40 mg by mouth 2 (two) times daily.   RABEprazole 20 MG tablet Commonly known as: ACIPHEX Take 20 mg by mouth at bedtime.   ticagrelor 90 MG Tabs tablet Commonly known as: BRILINTA Take 1 tablet (90 mg total) by mouth 2 (two) times daily.        Follow-up Information     End, Harrell Gave, MD Follow up.   Specialty: Cardiology Why: Office will call you to make follow-up appointment in next 2 weeks.  Call them if you have not heard from them by Friday. Contact information: [place] [address] [phone]                Discharge Exam: Danley Danker Weights   08/16/22 1354 08/16/22 2132  Weight: 74 kg 67.1 kg   General: Alert and oriented x3, no acute distress Cardiovascular: Regular rate and rhythm, S1-S2 Lungs: Clear to auscultation bilaterally  Condition at discharge: good  The results of significant diagnostics from this hospitalization (including imaging, microbiology, ancillary and laboratory) are listed below for reference.   Imaging Studies: CARDIAC  CATHETERIZATION  Result Date: 08/17/2022 Conclusions: Severe three-vessel coronary artery disease, including long segment of calcified mid LAD disease of up to 80%, 50% distal LAD stenosis, subtotal occlusion (99% with TIMI-1 flow) of OM1, and 70% proximal stenosis of small, non-dominant RCA. Mildly elevated left ventricular filling pressure (LVEDP 17 mmHg). Successful PCI to OM1 using overlapping Onyx Frontier 2.25 x 22 mm and 2.0 x 12 mm drug-eluting stents with 0% residual stenosis and TIMI-3.  Initial stent placement was complicated by distal edge dissection successful treated with overlapping stent at the distal margin. Recommendations: Dual antiplatelet therapy with aspirin and ticagrelor for at least 12 months. Aggressive secondary prevention.  We have agreed to rechallenge Ms. Kontos with statin therapy. Plan for medical management of LAD disease with further discussion of staged PCI to be done at follow-up.  We will continue recently added propranolol and also start isosorbide mononitrate. Outpatient cardiac rehab referral has been placed. Nelva Bush, MD [facility]  ECHOCARDIOGRAM COMPLETE  Result Date: 08/17/2022    ECHOCARDIOGRAM REPORT   Patient Name:   DELORISE HUNKELE Date of Exam: 08/17/2022 Medical Rec #:  [id number]       Height:       63.0 in  Accession #:    [id number]      Weight:       148.0 lb Date of Birth:  March 24, 1956       BSA:          1.701 m Patient Age:    82 years        BP:           125/82 mmHg Patient Gender: F               HR:           60 bpm. Exam Location:  [facility] Procedure: 2D Echo, Cardiac Doppler, Color Doppler and Intracardiac            Opacification Agent Indications:     R06.00 Dyspnea                  R07.9 Chest pain                  Elevated Troponin  History:         Patient has no prior history of Echocardiogram examinations.                  Risk Factors:Hypertension, Diabetes and Dyslipidemia.  Sonographer:     Rosalia Hammers Referring Phys:  [id number] AMY N  COX Diagnosing Phys: Nelva Bush MD IMPRESSIONS  1. Left ventricular ejection fraction, by estimation, is 55 to 60%. The left ventricle has normal function. The left ventricle demonstrates regional wall motion abnormalities (see scoring diagram/findings for description). There is mild left ventricular  hypertrophy. Left ventricular diastolic parameters are consistent with Grade I diastolic dysfunction (impaired relaxation). Elevated left atrial pressure. There is moderate hypokinesis of the left ventricular, basal-mid inferolateral wall.  2. Right ventricular systolic function is normal. The right ventricular size is normal. Tricuspid regurgitation signal is inadequate for assessing PA pressure.  3. The mitral valve is normal in structure. Trivial mitral valve regurgitation.  4. The aortic valve is tricuspid. There is mild thickening of the aortic valve. Aortic valve regurgitation is mild.  5. The inferior vena cava is normal in size with <50% respiratory variability, suggesting right atrial pressure of 8 mmHg. FINDINGS  Left Ventricle: Left ventricular ejection fraction, by estimation, is 55 to 60%. The left ventricle has normal function. The left ventricle demonstrates regional wall motion abnormalities. Moderate hypokinesis of the left ventricular, basal-mid inferolateral wall. Definity contrast agent was given IV to delineate the left ventricular endocardial borders. The left ventricular internal cavity size was normal in size. There is mild left ventricular hypertrophy. Left ventricular diastolic parameters are consistent with Grade I diastolic dysfunction (impaired relaxation). Elevated left atrial pressure. Right Ventricle: The right ventricular size is normal. No increase in right ventricular wall thickness. Right ventricular systolic function is normal. Tricuspid regurgitation signal is inadequate for assessing PA pressure. Left Atrium: Left atrial size was normal in size. Right Atrium: Right atrial  size was normal in size. Pericardium: There is no evidence of pericardial effusion. Mitral Valve: The mitral valve is normal in structure. Trivial mitral valve regurgitation. Tricuspid Valve: The tricuspid valve is normal in structure. Tricuspid valve regurgitation is trivial. Aortic Valve: The aortic valve is tricuspid. There is mild thickening of the aortic valve. Aortic valve regurgitation is mild. Aortic regurgitation PHT measures 455 msec. Aortic valve mean gradient measures 4.0 mmHg. Aortic valve peak gradient measures 6.2 mmHg. Aortic valve area, by VTI measures 1.76 cm. Pulmonic Valve: The pulmonic valve was normal in  structure. Pulmonic valve regurgitation is trivial. No evidence of pulmonic stenosis. Aorta: The aortic root and ascending aorta are structurally normal, with no evidence of dilitation. Pulmonary Artery: The pulmonary artery is of normal size. Venous: The inferior vena cava is normal in size with less than 50% respiratory variability, suggesting right atrial pressure of 8 mmHg. IAS/Shunts: No atrial level shunt detected by color flow Doppler.  LEFT VENTRICLE PLAX 2D LVIDd:         4.16 cm   Diastology LVIDs:         2.43 cm   LV e' medial:    6.09 cm/s LV PW:         1.28 cm   LV E/e' medial:  15.4 LV IVS:        1.25 cm   LV e' lateral:   6.96 cm/s LVOT diam:     1.60 cm   LV E/e' lateral: 13.4 LV SV:         50 LV SV Index:   30 LVOT Area:     2.01 cm  RIGHT VENTRICLE RV Basal diam:  2.55 cm RV S prime:     10.70 cm/s TAPSE (M-mode): 1.8 cm LEFT ATRIUM             Index        RIGHT ATRIUM           Index LA diam:        3.30 cm 1.94 cm/m   RA Area:     12.60 cm LA Vol (A2C):   24.3 ml 14.28 ml/m  RA Volume:   27.50 ml  16.16 ml/m LA Vol (A4C):   25.3 ml 14.87 ml/m LA Biplane Vol: 26.6 ml 15.63 ml/m  AORTIC VALVE AV Area (Vmax):    1.77 cm AV Area (Vmean):   1.62 cm AV Area (VTI):     1.76 cm AV Vmax:           124.00 cm/s AV Vmean:          90.300 cm/s AV VTI:            0.286 m  AV Peak Grad:      6.2 mmHg AV Mean Grad:      4.0 mmHg LVOT Vmax:         109.00 cm/s LVOT Vmean:        72.800 cm/s LVOT VTI:          0.251 m LVOT/AV VTI ratio: 0.88 AI PHT:            455 msec  AORTA Ao Root diam: 3.30 cm MITRAL VALVE MV Area (PHT): 3.48 cm     SHUNTS MV Decel Time: 218 msec     Systemic VTI:  0.25 m MV E velocity: 93.60 cm/s   Systemic Diam: 1.60 cm MV A velocity: 115.00 cm/s MV E/A ratio:  0.81 Harrell Gave End MD Electronically signed by Nelva Bush MD Signature Date/Time: 08/17/2022/2:13:43 PM    Final    CT Angio Chest/Abd/Pel for Dissection W and/or W/WO  Result Date: 08/16/2022 CLINICAL DATA:  Chest pain radiating to the EXAM: CT ANGIOGRAPHY CHEST, ABDOMEN AND PELVIS TECHNIQUE: Non-contrast CT of the chest was initially obtained. Multidetector CT imaging through the chest, abdomen and pelvis was performed using the standard protocol during bolus administration of intravenous contrast. Multiplanar reconstructed images and MIPs were obtained and reviewed to evaluate the vascular anatomy. RADIATION DOSE REDUCTION: This exam was performed according to the  departmental dose-optimization program which includes automated exposure control, adjustment of the mA and/or kV according to patient size and/or use of iterative reconstruction technique. CONTRAST:  140m OMNIPAQUE IOHEXOL 350 MG/ML SOLN COMPARISON:  Chest x-ray 08/16/2022 FINDINGS: CTA CHEST FINDINGS Cardiovascular: Non contrasted images of the chest demonstrate no acute intramural hematoma. Mild aortic atherosclerosis. No aneurysm. Coronary vascular calcification. No dissection is seen. Normal cardiac size. No pericardial effusion Mediastinum/Nodes: No enlarged mediastinal, hilar, or axillary lymph nodes. Thyroid gland, trachea, and esophagus demonstrate no significant findings. Lungs/Pleura: Lungs are clear. No pleural effusion or pneumothorax. Musculoskeletal: No chest wall abnormality. No acute or significant osseous findings.  Review of the MIP images confirms the above findings. CTA ABDOMEN AND PELVIS FINDINGS VASCULAR Aorta: Normal caliber aorta without aneurysm, dissection, vasculitis or significant stenosis. Mild aortic atherosclerosis. Celiac: Patent without evidence of aneurysm, dissection, vasculitis or significant stenosis. SMA: Patent without evidence of aneurysm, dissection, vasculitis or significant stenosis. Renals: Both renal arteries are patent without evidence of aneurysm, dissection, vasculitis, fibromuscular dysplasia or significant stenosis. IMA: Patent without evidence of aneurysm, dissection, vasculitis or significant stenosis. Inflow: Patent without evidence of aneurysm, dissection, vasculitis or significant stenosis. Review of the MIP images confirms the above findings. NON-VASCULAR Hepatobiliary: No focal liver abnormality is seen. Status post cholecystectomy. No biliary dilatation. Pancreas: Unremarkable. No pancreatic ductal dilatation or surrounding inflammatory changes. Spleen: Normal in size without focal abnormality. Adrenals/Urinary Tract: Adrenal glands are unremarkable. Kidneys are normal, without renal calculi, focal lesion, or hydronephrosis. Bladder is unremarkable. Stomach/Bowel: Stomach is within normal limits. Appendix not well seen but no right lower quadrant inflammation. No evidence of bowel wall thickening, distention, or inflammatory changes. Lymphatic: No suspicious lymph nodes Reproductive: Status post hysterectomy. No adnexal masses. Other: No abdominal wall hernia or abnormality. No abdominopelvic ascites. Musculoskeletal: No acute or significant osseous findings. Review of the MIP images confirms the above findings. IMPRESSION: 1. Negative for acute aortic dissection or aneurysm. 2. Mild atherosclerosis without significant stenosis or acute occlusive disease. 3. No CT evidence for acute intra-abdominal or pelvic abnormality. Electronically Signed   By: KDonavan FoilM.D.   On: 08/16/2022  20:53   DG Chest 2 View  Result Date: 08/16/2022 CLINICAL DATA:  Chest pain. EXAM: CHEST - 2 VIEW COMPARISON:  Chest x-ray January 20, 2013. FINDINGS: The heart size and mediastinal contours are within normal limits. Both lungs are clear. No visible pleural effusions or pneumothorax. No acute osseous abnormality. Right upper quadrant clips. IMPRESSION: No active cardiopulmonary disease. Electronically Signed   By: FMargaretha SheffieldM.D.   On: 08/16/2022 14:39    Microbiology: Results for orders placed or performed during the hospital encounter of 09/14/19  SARS CORONAVIRUS 2 (TAT 6-24 HRS) Nasopharyngeal Nasopharyngeal Swab     Status: None   Collection Time: 09/14/19 11:33 AM   Specimen: Nasopharyngeal Swab  Result Value Ref Range Status   SARS Coronavirus 2 NEGATIVE NEGATIVE Final    Comment: (NOTE) SARS-CoV-2 target nucleic acids are NOT DETECTED. The SARS-CoV-2 RNA is generally detectable in upper and lower respiratory specimens during the acute phase of infection. Negative results do not preclude SARS-CoV-2 infection, do not rule out co-infections with other pathogens, and should not be used as the sole basis for treatment or other patient management decisions. Negative results must be combined with clinical observations, patient history, and epidemiological information. The expected result is Negative. Fact Sheet for Patients: hSugarRoll.beFact Sheet for Healthcare Providers: hhttps://www.woods-mathews.com/This test is not yet approved or cleared by the  [place] Architectural technologist and  has been authorized for detection and/or diagnosis of SARS-CoV-2 by FDA under an Print production planner (EUA). This EUA will remain  in effect (meaning this test can be used) for the duration of the COVID-19 declaration under Section 56 4(b)(1) of the Act, 21 U.S.C. section 360bbb-3(b)(1), unless the authorization is terminated or revoked sooner. Performed at  [facility], [place] [address] [place], [place] [address]     Labs: CBC: Recent Labs  Lab 08/16/22 1357 08/17/22 0514 08/18/22 0717  WBC 6.6 5.2 5.1  HGB 13.8 12.7 12.1  HCT 42.3 38.9 37.0  MCV 89.2 89.6 89.6  PLT 322 271 254   Basic Metabolic Panel: Recent Labs  Lab 08/16/22 1357 08/17/22 0514 08/18/22 0717  NA 140 141 138  K 4.1 3.8 4.2  CL 105 108 109  CO2 '24 23 22  '$ GLUCOSE 125* 127* 131*  BUN '14 17 14  '$ CREATININE 0.77 0.77 0.75  CALCIUM 9.7 8.4* 8.7*   Liver Function Tests: No results for input(s): "AST", "ALT", "ALKPHOS", "BILITOT", "PROT", "ALBUMIN" in the last 168 hours. CBG: Recent Labs  Lab 08/17/22 1501 08/17/22 1819 08/17/22 2237 08/18/22 0832 08/18/22 1253  GLUCAP 117* 106* 117* 135* 173*    Discharge time spent: less than 30 minutes.  Signed: Annita Brod, MD [facility] 08/18/2022

## 2022-08-19 LAB — LIPOPROTEIN A (LPA): Lipoprotein (a): 35.9 nmol/L — ABNORMAL HIGH (ref <75.0)

## 2022-09-02 ENCOUNTER — Ambulatory Visit: Payer: Managed Care, Other (non HMO) | Attending: Internal Medicine | Admitting: Internal Medicine

## 2022-09-02 ENCOUNTER — Encounter: Payer: Self-pay | Admitting: Internal Medicine

## 2022-09-02 VITALS — BP 110/80 | HR 73 | Ht 63.0 in | Wt 146.0 lb

## 2022-09-02 DIAGNOSIS — E1169 Type 2 diabetes mellitus with other specified complication: Secondary | ICD-10-CM

## 2022-09-02 DIAGNOSIS — I214 Non-ST elevation (NSTEMI) myocardial infarction: Secondary | ICD-10-CM

## 2022-09-02 DIAGNOSIS — I5032 Chronic diastolic (congestive) heart failure: Secondary | ICD-10-CM

## 2022-09-02 DIAGNOSIS — E785 Hyperlipidemia, unspecified: Secondary | ICD-10-CM | POA: Diagnosis not present

## 2022-09-02 MED ORDER — ISOSORBIDE MONONITRATE ER 30 MG PO TB24: 30.0000 mg | ORAL_TABLET | Freq: Every day | ORAL | 3 refills | Status: DC

## 2022-09-02 MED ORDER — NITROGLYCERIN 0.4 MG SL SUBL: 0.4000 mg | SUBLINGUAL_TABLET | SUBLINGUAL | 3 refills | Status: DC | PRN

## 2022-09-02 NOTE — Patient Instructions (Addendum)
Medication Instructions:   INCREASE Isosorbide (imdur) - take one tablet ('30mg'$ ) by mouth daily.  Nitroglycerin -  Place 1 tablet (0.4 mg total) under the tongue every 5 (five) minutes as needed for chest pain  *If you need a refill on your cardiac medications before your next appointment, please call your pharmacy*   Lab Work:  Your physician will need you top go to the medical for lab work - BMP CBC  If you have labs (blood work) drawn today and your tests are completely normal, you will receive your results only by: [place] (if you have MyChart) OR A paper copy in the mail If you have any lab test that is abnormal or we need to change your treatment, we will call you to review the results.   Testing/Procedures:   You are scheduled for a Cardiac Catheterization on Monday, October 9 with Dr. Harrell Gave End.  Please arrive at the [employer] [facility] [employer] (Main Entrance A) at [facility]: [address] [place], [place] [address] at 5:30 AM (This time is two hours before your procedure to ensure your preparation). Free valet parking service is available.   Special note: Every effort is made to have your procedure done on time. Please understand that emergencies sometimes delay scheduled procedures.  Diet: Do not eat solid foods after midnight.  The patient may have clear liquids until 5am upon the day of the procedure.  Labs: You will need to have blood drawn on TODAY -- BMP CBC  Do not take Diabetes Med Glucophage (Metformin) on the day of the procedure and HOLD 48 HOURS AFTER THE PROCEDURE.  On the morning of your procedure, take your Brilinta/Ticagrelor and any morning medicines NOT listed above.  You may use sips of water.  - Plan for one night stay--bring personal belongings. - Bring a current list of your medications and current insurance cards.  - You MUST have a responsible person to drive you home. - Someone MUST be with you the first 24 hours after you arrive home or  your discharge will be delayed. - Please wear clothes that are easy to get on and off and wear slip-on shoes.   Follow-Up: At [facility], you and your health needs are our priority.  As part of our continuing mission to provide you with exceptional heart care, we have created designated Provider Care Teams.  These Care Teams include your primary Cardiologist (physician) and Advanced Practice Providers (APPs -  Physician Assistants and Nurse Practitioners) who all work together to provide you with the care you need, when you need it.  We recommend signing up for the patient portal called "MyChart".  Sign up information is provided on this After Visit Summary.  MyChart is used to connect with patients for Virtual Visits (Telemedicine).  Patients are able to view lab/test results, encounter notes, upcoming appointments, etc.  Non-urgent messages can be sent to your provider as well.   To learn more about what you can do with MyChart, go to [email or web address].    Your next appointment:    2 weeks after PCI   The format for your next appointment:   In Person  Provider:   You may see Nelva Bush, MD or one of the following Advanced Practice Providers on your designated Care Team:   Murray Hodgkins, NP Christell Faith, PA-C Cadence Kathlen Mody, PA-C Gerrie Nordmann, NP     Important Information About Sugar

## 2022-09-02 NOTE — Progress Notes (Signed)
Follow-up Outpatient Visit Date: 09/02/2022  Primary Care Provider: Kirk Ruths, MD [facility] [place] [address]  Chief Complaint: Chest tightness  HPI:  [name] is a 65 y.o. female with history of coronary artery disease with recent NSTEMI status post PCI to subtotally occluded OM1 (significant residual disease involving the LAD and nondominant RCA not intervened upon at the time), hyperlipidemia, type 2 diabetes mellitus, carotid artery disease, degenerative disc disease, and esophagitis who presents for follow-up of coronary artery disease.  She presented to [facility] ED on 08/16/2022 with intermittent chest pain and shortness of breath.  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  Slight troponin elevation was noted on admission.  As her husband's passing was felt to be imminent, decision was made to forego staged PCI during the admission to the LAD in favor of medical therapy.  Today, [name] reports that she continues to have chest tightness almost all the time, though she has not had any more episodes of severe chest discomfort like what led up to her recent hospitalization.  She notes, however, that she has not done much strenuous activity since leaving the hospital although she was able to mow her lawn once.  She denies shortness of breath, palpitations, lightheadedness, and edema.  Her husband passed away 2 days after her hospital discharge.  She is tolerating her medications well.  She has a long history of intermittent headaches for which she uses Excedrin.  The headaches have not been exacerbated by low-dose isosorbide mononitrate.  --------------------------------------------------------------------------------------------------  Past Medical History:  Diagnosis Date   Borderline glaucoma    Cancer ([place])    skin cancer basal cell   Coronary artery  disease 08/16/2022   NSTEMI s/p PCI to OM1   Degenerative disc disease, lumbar    Gestational diabetes    Hyperlipemia    Right-sided carotid artery disease ([place])    echo done   Past Surgical History:  Procedure Laterality Date   ABDOMINAL HYSTERECTOMY     APPENDECTOMY     AUGMENTATION MAMMAPLASTY Bilateral 04/05/1984   Pt had implants removed last time   CHOLECYSTECTOMY     COLONOSCOPY WITH PROPOFOL N/A 01/12/2016   Procedure: COLONOSCOPY WITH PROPOFOL;  Surgeon: Lollie Sails, MD;  Location: [facility] ENDOSCOPY;  Service: Endoscopy;  Laterality: N/A;   CORONARY STENT INTERVENTION N/A 08/17/2022   Procedure: CORONARY STENT INTERVENTION;  Surgeon: Nelva Bush, MD;  Location: [place] CV LAB;  Service: Cardiovascular;  Laterality: N/A;   ESOPHAGOGASTRODUODENOSCOPY (EGD) WITH PROPOFOL N/A 09/18/2019   Procedure: ESOPHAGOGASTRODUODENOSCOPY (EGD) WITH PROPOFOL;  Surgeon: Lollie Sails, MD;  Location: [facility] ENDOSCOPY;  Service: Endoscopy;  Laterality: N/A;   LEFT HEART CATH AND CORONARY ANGIOGRAPHY N/A 08/17/2022   Procedure: LEFT HEART CATH AND CORONARY ANGIOGRAPHY;  Surgeon: Nelva Bush, MD;  Location: [facility] CV LAB;  Service: Cardiovascular;  Laterality: N/A;    Current Meds  Medication Sig   aspirin EC 81 MG tablet Take 1 tablet (81 mg total) by mouth daily. Swallow whole.   atorvastatin (LIPITOR) 40 MG tablet Take 1 tablet (40 mg total) by mouth daily.   DULoxetine (CYMBALTA) 30 MG capsule Take 30 mg by mouth daily.   feeding supplement, GLUCERNA SHAKE, (GLUCERNA SHAKE) LIQD Take 237 mLs by mouth 3 (three) times daily between meals.   isosorbide mononitrate (IMDUR) 30 MG 24 hr tablet  Take 0.5 tablets (15 mg total) by mouth daily.   metFORMIN (GLUCOPHAGE-XR) 500 MG 24 hr tablet Take 500 mg by mouth daily with supper.   propranolol (INDERAL) 40 MG tablet Take 40 mg by mouth 2 (two) times daily.   RABEprazole (ACIPHEX) 20 MG tablet Take 20 mg by mouth at bedtime.    ticagrelor (BRILINTA) 90 MG TABS tablet Take 1 tablet (90 mg total) by mouth 2 (two) times daily.    Allergies: Demerol [meperidine], Codeine, Crestor [rosuvastatin], and Omeprazole  Social History   Tobacco Use   Smoking status: Never   Smokeless tobacco: Never  Vaping Use   Vaping Use: Never used  Substance Use Topics   Alcohol use: No   Drug use: Never    Family History  Problem Relation Age of Onset   Breast cancer Neg Hx     Review of Systems: A 12-system review of systems was performed and was negative except as noted in the HPI.  --------------------------------------------------------------------------------------------------  Physical Exam: BP 110/80 (BP Location: Left Arm, Patient Position: Sitting, Cuff Size: Normal)   Pulse 73   Ht '5\' 3"'$  (1.6 m)   Wt 146 lb (66.2 kg)   SpO2 97%   BMI 25.86 kg/m   General:  NAD. Neck: No JVD or HJR. Lungs: Clear to auscultation bilaterally without wheezes or crackles. Heart: Regular rate and rhythm without murmurs, rubs, or gallops. Abdomen: Soft, nontender, nondistended. Extremities: No lower extremity edema.  Left radial arteriotomy well-healed with 2+ pulse.  EKG:  Normal sinus rhythm with poor R wave progression.  Compared with prior tracing from 08/18/2022, heart rate has increased.  Poor R wave progression is also present.  Lab Results  Component Value Date   WBC 5.1 08/18/2022   HGB 12.1 08/18/2022   HCT 37.0 08/18/2022   MCV 89.6 08/18/2022   PLT 263 08/18/2022    Lab Results  Component Value Date   NA 138 08/18/2022   K 4.2 08/18/2022   CL 109 08/18/2022   CO2 22 08/18/2022   BUN 14 08/18/2022   CREATININE 0.75 08/18/2022   GLUCOSE 131 (H) 08/18/2022   ALT 315 (H) 01/22/2013    Lab Results  Component Value Date   CHOL 248 (H) 08/18/2022   HDL 37 (L) 08/18/2022   LDLCALC 167 (H) 08/18/2022   TRIG 219 (H) 08/18/2022   CHOLHDL 6.7 08/18/2022     --------------------------------------------------------------------------------------------------  ASSESSMENT AND PLAN: NSTEMI: [name] is feeling somewhat better following PCI to subtotally occluded OM branch in the setting of her NSTEMI earlier this month.  However, she continues to have CCS class IV angina in the setting of her severe mid LAD disease.  We have discussed the role for medical therapy and revascularization; she would like to move forward with PCI.  Given tortuosity and associated calcification, we may require intracoronary lithotripsy and/or atherectomy.  I therefore recommend that the PCI be performed at [facility].  We will plan to move forward with this on 09/13/2022 so that her children can be with her.  In the meantime, we will increase isosorbide mononitrate to 30 mg daily.  She will need to remain on dual antiplatelet therapy with aspirin and ticagrelor for at least 12 months.  I have provided her with a prescription for sublingual nitroglycerin to be taken as needed for chest pain.  I also advised her to seek immediate medical attention if her symptoms worsen.  Cardiac rehab referral was placed during her recent hospitalization.  However,  I would favor deferring initiation of this program until after we have completed her LAD revascularization.  Hyperlipidemia associated with type 2 diabetes mellitus: LDL quite elevated during recent hospitalization at 167.  She was previously intolerant of statins but seems to be tolerating atorvastatin 40 mg daily well.  She will need a follow-up lipid panel and ALT in 2-3 months.  Ongoing management of diabetes per Dr. Ouida Sills.  Shared Decision Making/Informed Consent The risks [stroke (1 in 1000), death (1 in 1000), kidney failure [usually temporary] (1 in 500), bleeding (1 in 200), allergic reaction [possibly serious] (1 in 200)], benefits (diagnostic support and management of coronary artery disease) and alternatives of a cardiac  catheterization were discussed in detail with [name] and she is willing to proceed.  Follow-up: Return to clinic in 3-4 weeks.  Nelva Bush, MD 09/02/2022 11:01 AM

## 2022-09-02 NOTE — H&P (View-Only) (Signed)
Follow-up Outpatient Visit Date: 09/02/2022  Primary Care Provider: Kirk Ruths, MD [facility] [place] [address]  Chief Complaint: Chest tightness  HPI:  Tammie Smith is a 66 y.o. female with history of coronary artery disease with recent NSTEMI status post PCI to subtotally occluded OM1 (significant residual disease involving the LAD and nondominant RCA not intervened upon at the time), hyperlipidemia, type 2 diabetes mellitus, carotid artery disease, degenerative disc disease, and esophagitis who presents for follow-up of coronary artery disease.  She presented to [facility] ED on 08/16/2022 with intermittent chest pain and shortness of breath.  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  Slight troponin elevation was noted on admission.  As her husband's passing was felt to be imminent, decision was made to forego staged PCI during the admission to the LAD in favor of medical therapy.  Today, Tammie Smith reports that she continues to have chest tightness almost all the time, though she has not had any more episodes of severe chest discomfort like what led up to her recent hospitalization.  She notes, however, that she has not done much strenuous activity since leaving the hospital although she was able to mow her lawn once.  She denies shortness of breath, palpitations, lightheadedness, and edema.  Her husband passed away 2 days after her hospital discharge.  She is tolerating her medications well.  She has a long history of intermittent headaches for which she uses Excedrin.  The headaches have not been exacerbated by low-dose isosorbide mononitrate.  --------------------------------------------------------------------------------------------------  Past Medical History:  Diagnosis Date   Borderline glaucoma    Cancer ([place])    skin cancer basal cell   Coronary artery  disease 08/16/2022   NSTEMI s/p PCI to OM1   Degenerative disc disease, lumbar    Gestational diabetes    Hyperlipemia    Right-sided carotid artery disease ([place])    echo done   Past Surgical History:  Procedure Laterality Date   ABDOMINAL HYSTERECTOMY     APPENDECTOMY     AUGMENTATION MAMMAPLASTY Bilateral 04/05/1984   Pt had implants removed last time   CHOLECYSTECTOMY     COLONOSCOPY WITH PROPOFOL N/A 01/12/2016   Procedure: COLONOSCOPY WITH PROPOFOL;  Surgeon: Lollie Sails, MD;  Location: [facility] ENDOSCOPY;  Service: Endoscopy;  Laterality: N/A;   CORONARY STENT INTERVENTION N/A 08/17/2022   Procedure: CORONARY STENT INTERVENTION;  Surgeon: Nelva Bush, MD;  Location: [place] CV LAB;  Service: Cardiovascular;  Laterality: N/A;   ESOPHAGOGASTRODUODENOSCOPY (EGD) WITH PROPOFOL N/A 09/18/2019   Procedure: ESOPHAGOGASTRODUODENOSCOPY (EGD) WITH PROPOFOL;  Surgeon: Lollie Sails, MD;  Location: [facility] ENDOSCOPY;  Service: Endoscopy;  Laterality: N/A;   LEFT HEART CATH AND CORONARY ANGIOGRAPHY N/A 08/17/2022   Procedure: LEFT HEART CATH AND CORONARY ANGIOGRAPHY;  Surgeon: Nelva Bush, MD;  Location: [place] CV LAB;  Service: Cardiovascular;  Laterality: N/A;    Current Meds  Medication Sig   aspirin EC 81 MG tablet Take 1 tablet (81 mg total) by mouth daily. Swallow whole.   atorvastatin (LIPITOR) 40 MG tablet Take 1 tablet (40 mg total) by mouth daily.   DULoxetine (CYMBALTA) 30 MG capsule Take 30 mg by mouth daily.   feeding supplement, GLUCERNA SHAKE, (GLUCERNA SHAKE) LIQD Take 237 mLs by mouth 3 (three) times daily between meals.   isosorbide mononitrate (IMDUR) 30 MG 24 hr tablet  Take 0.5 tablets (15 mg total) by mouth daily.   metFORMIN (GLUCOPHAGE-XR) 500 MG 24 hr tablet Take 500 mg by mouth daily with supper.   propranolol (INDERAL) 40 MG tablet Take 40 mg by mouth 2 (two) times daily.   RABEprazole (ACIPHEX) 20 MG tablet Take 20 mg by mouth at bedtime.    ticagrelor (BRILINTA) 90 MG TABS tablet Take 1 tablet (90 mg total) by mouth 2 (two) times daily.    Allergies: Demerol [meperidine], Codeine, Crestor [rosuvastatin], and Omeprazole  Social History   Tobacco Use   Smoking status: Never   Smokeless tobacco: Never  Vaping Use   Vaping Use: Never used  Substance Use Topics   Alcohol use: No   Drug use: Never    Family History  Problem Relation Age of Onset   Breast cancer Neg Hx     Review of Systems: A 12-system review of systems was performed and was negative except as noted in the HPI.  --------------------------------------------------------------------------------------------------  Physical Exam: BP 110/80 (BP Location: Left Arm, Patient Position: Sitting, Cuff Size: Normal)   Pulse 73   Ht '5\' 3"'$  (1.6 m)   Wt 146 lb (66.2 kg)   SpO2 97%   BMI 25.86 kg/m   General:  NAD. Neck: No JVD or HJR. Lungs: Clear to auscultation bilaterally without wheezes or crackles. Heart: Regular rate and rhythm without murmurs, rubs, or gallops. Abdomen: Soft, nontender, nondistended. Extremities: No lower extremity edema.  Left radial arteriotomy well-healed with 2+ pulse.  EKG:  Normal sinus rhythm with poor R wave progression.  Compared with prior tracing from 08/18/2022, heart rate has increased.  Poor R wave progression is also present.  Lab Results  Component Value Date   WBC 5.1 08/18/2022   HGB 12.1 08/18/2022   HCT 37.0 08/18/2022   MCV 89.6 08/18/2022   PLT 263 08/18/2022    Lab Results  Component Value Date   NA 138 08/18/2022   K 4.2 08/18/2022   CL 109 08/18/2022   CO2 22 08/18/2022   BUN 14 08/18/2022   CREATININE 0.75 08/18/2022   GLUCOSE 131 (H) 08/18/2022   ALT 315 (H) 01/22/2013    Lab Results  Component Value Date   CHOL 248 (H) 08/18/2022   HDL 37 (L) 08/18/2022   LDLCALC 167 (H) 08/18/2022   TRIG 219 (H) 08/18/2022   CHOLHDL 6.7 08/18/2022     --------------------------------------------------------------------------------------------------  ASSESSMENT AND PLAN: NSTEMI: Tammie Smith is feeling somewhat better following PCI to subtotally occluded OM branch in the setting of her NSTEMI earlier this month.  However, she continues to have CCS class IV angina in the setting of her severe mid LAD disease.  We have discussed the role for medical therapy and revascularization; she would like to move forward with PCI.  Given tortuosity and associated calcification, we may require intracoronary lithotripsy and/or atherectomy.  I therefore recommend that the PCI be performed at [facility].  We will plan to move forward with this on 09/13/2022 so that her children can be with her.  In the meantime, we will increase isosorbide mononitrate to 30 mg daily.  She will need to remain on dual antiplatelet therapy with aspirin and ticagrelor for at least 12 months.  I have provided her with a prescription for sublingual nitroglycerin to be taken as needed for chest pain.  I also advised her to seek immediate medical attention if her symptoms worsen.  Cardiac rehab referral was placed during her recent hospitalization.  However,  I would favor deferring initiation of this program until after we have completed her LAD revascularization.  Hyperlipidemia associated with type 2 diabetes mellitus: LDL quite elevated during recent hospitalization at 167.  She was previously intolerant of statins but seems to be tolerating atorvastatin 40 mg daily well.  She will need a follow-up lipid panel and ALT in 2-3 months.  Ongoing management of diabetes per Dr. Ouida Sills.  Shared Decision Making/Informed Consent The risks [stroke (1 in 1000), death (1 in 1000), kidney failure [usually temporary] (1 in 500), bleeding (1 in 200), allergic reaction [possibly serious] (1 in 200)], benefits (diagnostic support and management of coronary artery disease) and alternatives of a cardiac  catheterization were discussed in detail with Tammie Smith and she is willing to proceed.  Follow-up: Return to clinic in 3-4 weeks.  Nelva Bush, MD 09/02/2022 11:01 AM

## 2022-09-03 ENCOUNTER — Telehealth: Payer: Self-pay | Admitting: Internal Medicine

## 2022-09-03 ENCOUNTER — Encounter: Payer: Self-pay | Admitting: Internal Medicine

## 2022-09-03 NOTE — Telephone Encounter (Signed)
Caller stated their office received results for this patient from [place] stated [facility] told her that the requisition for this order came from Dr. Darnelle Bos office but this patient is not one of their clients.  Caller would like a call back to address the requisition for this order.

## 2022-09-03 NOTE — Telephone Encounter (Signed)
Returned the call to [place] at [facility] Estill Bamberg sts that they received the patients lab results from [place] but the patient is not their client. The labs (cbc, bmp) were ordered by Dr. Saunders Revel.   Estill Bamberg sts that this has happened in the past and when she contacted [facility] she was told that we are incorrectly using [employer] account number.  Mackie Pai that I was doubtful of that. Adv her that our Lab requisitions for [facility] are computer generated. Adv her that I think the error may be on [facility] side.  Estill Bamberg will fax over the results they received and attn them Dr. Saunders Revel.

## 2022-09-09 ENCOUNTER — Telehealth: Payer: Self-pay | Admitting: *Deleted

## 2022-09-09 NOTE — Telephone Encounter (Signed)
Coronary Stent scheduled at [facility] for: Monday September 13, 2022 7:30 AM Arrival time and place: [place] Entrance A at: 5:30 AM  Nothing to eat after midnight prior to procedure, clear liquids until 5 AM day of procedure.  Medication instructions: -Hold:  Metformin-day of procedure and 48 hours post procedure -Except hold medications usual morning medications can be taken with sips of water including aspirin 81 mg and Brilinta 90 mg.  Confirmed patient has responsible adult to drive home post procedure and be with patient first 24 hours after arriving home.  Patient reports no new symptoms concerning for COVID-19 in the past 10 days.  Reviewed procedure instructions with patient.

## 2022-09-13 ENCOUNTER — Encounter (HOSPITAL_COMMUNITY)
Admission: RE | Disposition: A | Discharge status: Home / Self Care | Payer: Self-pay | Source: Home / Self Care | Attending: Internal Medicine

## 2022-09-13 ENCOUNTER — Other Ambulatory Visit: Payer: Self-pay

## 2022-09-13 ENCOUNTER — Ambulatory Visit (HOSPITAL_COMMUNITY)
Admission: RE | Admit: 2022-09-13 | Discharge: 2022-09-13 | Disposition: A | Discharge status: Home / Self Care | Payer: Managed Care, Other (non HMO) | Attending: Internal Medicine | Admitting: Internal Medicine

## 2022-09-13 DIAGNOSIS — I25118 Atherosclerotic heart disease of native coronary artery with other forms of angina pectoris: Secondary | ICD-10-CM | POA: Diagnosis not present

## 2022-09-13 DIAGNOSIS — Z79899 Other long term (current) drug therapy: Secondary | ICD-10-CM | POA: Insufficient documentation

## 2022-09-13 DIAGNOSIS — Z955 Presence of coronary angioplasty implant and graft: Secondary | ICD-10-CM | POA: Diagnosis not present

## 2022-09-13 DIAGNOSIS — I214 Non-ST elevation (NSTEMI) myocardial infarction: Secondary | ICD-10-CM

## 2022-09-13 DIAGNOSIS — I252 Old myocardial infarction: Secondary | ICD-10-CM | POA: Diagnosis not present

## 2022-09-13 DIAGNOSIS — Z7984 Long term (current) use of oral hypoglycemic drugs: Secondary | ICD-10-CM | POA: Insufficient documentation

## 2022-09-13 DIAGNOSIS — Z7982 Long term (current) use of aspirin: Secondary | ICD-10-CM | POA: Insufficient documentation

## 2022-09-13 DIAGNOSIS — E1169 Type 2 diabetes mellitus with other specified complication: Secondary | ICD-10-CM | POA: Diagnosis not present

## 2022-09-13 DIAGNOSIS — E785 Hyperlipidemia, unspecified: Secondary | ICD-10-CM | POA: Diagnosis not present

## 2022-09-13 HISTORY — PX: LEFT HEART CATH AND CORONARY ANGIOGRAPHY: CATH118249

## 2022-09-13 HISTORY — PX: CORONARY STENT INTERVENTION: CATH118234

## 2022-09-13 HISTORY — PX: INTRAVASCULAR ULTRASOUND/IVUS: CATH118244

## 2022-09-13 HISTORY — PX: CORONARY ULTRASOUND/IVUS: CATH118244

## 2022-09-13 LAB — POCT ACTIVATED CLOTTING TIME
Activated Clotting Time: 251 seconds
Activated Clotting Time: 299 seconds
Activated Clotting Time: 335 seconds

## 2022-09-13 SURGERY — CORONARY STENT INTERVENTION
Anesthesia: LOCAL

## 2022-09-13 MED ORDER — FENTANYL CITRATE (PF) 100 MCG/2ML IJ SOLN
INTRAMUSCULAR | Status: DC | PRN
  Administered 2022-09-13: 50 ug via INTRAVENOUS

## 2022-09-13 MED ORDER — IOHEXOL 350 MG/ML SOLN
INTRAVENOUS | Status: DC | PRN
  Administered 2022-09-13: 90 mL via INTRA_ARTERIAL

## 2022-09-13 MED ORDER — SODIUM CHLORIDE 0.9 % IV SOLN: INTRAVENOUS | Status: AC

## 2022-09-13 MED ORDER — LABETALOL HCL 5 MG/ML IV SOLN: 10.0000 mg | INTRAVENOUS | Status: DC | PRN

## 2022-09-13 MED ORDER — NITROGLYCERIN 1 MG/10 ML FOR IR/CATH LAB
INTRA_ARTERIAL | Status: DC | PRN
  Administered 2022-09-13: 100 ug via INTRACORONARY
  Administered 2022-09-13: 200 ug via INTRACORONARY

## 2022-09-13 MED ORDER — FENTANYL CITRATE (PF) 100 MCG/2ML IJ SOLN
INTRAMUSCULAR | Status: AC
  Filled 2022-09-13: qty 2

## 2022-09-13 MED ORDER — SODIUM CHLORIDE 0.9% FLUSH: 3.0000 mL | INTRAVENOUS | Status: DC | PRN

## 2022-09-13 MED ORDER — VERAPAMIL HCL 2.5 MG/ML IV SOLN
INTRAVENOUS | Status: AC
  Filled 2022-09-13: qty 2

## 2022-09-13 MED ORDER — VERAPAMIL HCL 2.5 MG/ML IV SOLN
INTRAVENOUS | Status: DC | PRN
  Administered 2022-09-13: 10 mL via INTRA_ARTERIAL

## 2022-09-13 MED ORDER — ONDANSETRON HCL 4 MG/2ML IJ SOLN: 4.0000 mg | Freq: Four times a day (QID) | INTRAMUSCULAR | Status: DC | PRN

## 2022-09-13 MED ORDER — SODIUM CHLORIDE 0.9 % WEIGHT BASED INFUSION: 1.0000 mL/kg/h | INTRAVENOUS | Status: DC

## 2022-09-13 MED ORDER — TICAGRELOR 90 MG PO TABS: 90.0000 mg | ORAL_TABLET | Freq: Two times a day (BID) | ORAL | Status: DC

## 2022-09-13 MED ORDER — ASPIRIN 81 MG PO CHEW: 81.0000 mg | CHEWABLE_TABLET | Freq: Every day | ORAL | Status: DC

## 2022-09-13 MED ORDER — METFORMIN HCL ER 500 MG PO TB24: 500.0000 mg | ORAL_TABLET | Freq: Every day | ORAL | Status: DC

## 2022-09-13 MED ORDER — SODIUM CHLORIDE 0.9 % WEIGHT BASED INFUSION
3.0000 mL/kg/h | INTRAVENOUS | Status: AC
  Administered 2022-09-13: 3 mL/kg/h via INTRAVENOUS

## 2022-09-13 MED ORDER — HEPARIN SODIUM (PORCINE) 1000 UNIT/ML IJ SOLN
INTRAMUSCULAR | Status: AC
  Filled 2022-09-13: qty 10

## 2022-09-13 MED ORDER — LIDOCAINE HCL (PF) 1 % IJ SOLN
INTRAMUSCULAR | Status: AC
  Filled 2022-09-13: qty 30

## 2022-09-13 MED ORDER — SODIUM CHLORIDE 0.9% FLUSH: 3.0000 mL | Freq: Two times a day (BID) | INTRAVENOUS | Status: DC

## 2022-09-13 MED ORDER — NITROGLYCERIN 1 MG/10 ML FOR IR/CATH LAB
INTRA_ARTERIAL | Status: AC
  Filled 2022-09-13: qty 10

## 2022-09-13 MED ORDER — SODIUM CHLORIDE 0.9 % IV SOLN: 250.0000 mL | INTRAVENOUS | Status: DC | PRN

## 2022-09-13 MED ORDER — MIDAZOLAM HCL 2 MG/2ML IJ SOLN
INTRAMUSCULAR | Status: DC | PRN
  Administered 2022-09-13: 1 mg via INTRAVENOUS

## 2022-09-13 MED ORDER — HEPARIN (PORCINE) IN NACL 1000-0.9 UT/500ML-% IV SOLN
INTRAVENOUS | Status: AC
  Filled 2022-09-13: qty 1000

## 2022-09-13 MED ORDER — HYDRALAZINE HCL 20 MG/ML IJ SOLN: 10.0000 mg | INTRAMUSCULAR | Status: DC | PRN

## 2022-09-13 MED ORDER — HEPARIN (PORCINE) IN NACL 1000-0.9 UT/500ML-% IV SOLN
INTRAVENOUS | Status: DC | PRN
  Administered 2022-09-13 (×2): 500 mL

## 2022-09-13 MED ORDER — TICAGRELOR 90 MG PO TABS: 90.0000 mg | ORAL_TABLET | ORAL | Status: DC

## 2022-09-13 MED ORDER — LIDOCAINE HCL (PF) 1 % IJ SOLN
INTRAMUSCULAR | Status: DC | PRN
  Administered 2022-09-13: 5 mL via INTRADERMAL

## 2022-09-13 MED ORDER — ACETAMINOPHEN 325 MG PO TABS: 650.0000 mg | ORAL_TABLET | ORAL | Status: DC | PRN

## 2022-09-13 MED ORDER — HEPARIN SODIUM (PORCINE) 1000 UNIT/ML IJ SOLN
INTRAMUSCULAR | Status: DC | PRN
  Administered 2022-09-13: 3000 [IU] via INTRAVENOUS
  Administered 2022-09-13: 2000 [IU] via INTRAVENOUS
  Administered 2022-09-13: 6000 [IU] via INTRAVENOUS

## 2022-09-13 MED ORDER — MIDAZOLAM HCL 2 MG/2ML IJ SOLN
INTRAMUSCULAR | Status: AC
  Filled 2022-09-13: qty 2

## 2022-09-13 MED ORDER — ISOSORBIDE MONONITRATE ER 30 MG PO TB24: 15.0000 mg | ORAL_TABLET | Freq: Every day | ORAL | 3 refills | Status: DC

## 2022-09-13 MED ORDER — ASPIRIN 81 MG PO CHEW: 81.0000 mg | CHEWABLE_TABLET | ORAL | Status: DC

## 2022-09-13 SURGICAL SUPPLY — 22 items
BALL SAPPHIRE NC24 3.0X15 (BALLOONS) ×1
BALLN SCOREFLEX 2.50X10 (BALLOONS) ×1
BALLOON SAPPHIRE NC24 3.0X15 (BALLOONS) IMPLANT
BALLOON SCOREFLEX 2.50X10 (BALLOONS) IMPLANT
CATH INFINITI JR4 5F (CATHETERS) IMPLANT
CATH OPTICROSS HD (CATHETERS) IMPLANT
CATH VISTA GUIDE 6FR XBLAD3.5 (CATHETERS) IMPLANT
DEVICE RAD COMP TR BAND LRG (VASCULAR PRODUCTS) IMPLANT
GLIDESHEATH SLEND SS 6F .021 (SHEATH) IMPLANT
GUIDEWIRE INQWIRE 1.5J.035X260 (WIRE) IMPLANT
INQWIRE 1.5J .035X260CM (WIRE) ×1
KIT ENCORE 26 ADVANTAGE (KITS) IMPLANT
KIT HEART LEFT (KITS) ×1 IMPLANT
KIT HEMO VALVE WATCHDOG (MISCELLANEOUS) IMPLANT
PACK CARDIAC CATHETERIZATION (CUSTOM PROCEDURE TRAY) ×1 IMPLANT
SLED PULL BACK IVUS (MISCELLANEOUS) IMPLANT
STENT SYNERGY XD 2.75X20 (Permanent Stent) IMPLANT
SYNERGY XD 2.75X20 (Permanent Stent) ×1 IMPLANT
TRANSDUCER W/STOPCOCK (MISCELLANEOUS) ×1 IMPLANT
TUBING CIL FLEX 10 FLL-RA (TUBING) ×1 IMPLANT
WIRE ASAHI GRAND SLAM 180CM (WIRE) IMPLANT
WIRE RUNTHROUGH .014X180CM (WIRE) IMPLANT

## 2022-09-13 NOTE — Discharge Summary (Signed)
Discharge Summary for Same Day PCI   Patient ID: Tammie Smith MRN: [id number]; DOB: 08/16/1956  Admit date: 09/13/2022 Discharge date: 09/13/2022  Primary Care Provider: Kirk Ruths, MD  Primary Cardiologist: Nelva Bush, MD  Primary Electrophysiologist:  None   Discharge Diagnoses    Active Problems:   Coronary artery disease of native artery of native heart with stable angina pectoris ([place])   HLD   CAD   Diagnostic Studies/Procedures    Cardiac Catheterization 09/13/2022:  CORONARY STENT INTERVENTION  Intravascular Ultrasound/IVUS  LEFT HEART CATH AND CORONARY ANGIOGRAPHY   Conclusion  Conclusions: Severe LAD disease with sequential 50%, 80-90%, and 50% mid to distal vessel lesions as well as 90% stenosis involving small D3 branch. Widely patent overlapping OM1 stents. Normal left ventricular filling pressure (LVEDP 14 mmHg). Successful IVUS-guided PCI to 80-90% mid LAD stenosis using Synergy 2.75 x 20 mm drug-eluting stent (postdilated to 3.1 mm) with 0% residual stenosis and TIMI-3 flow.   Recommendations: Continue dual antiplatelet therapy with aspirin and ticagrelor for at least 12 months from recent NSTEMI. Decrease isosorbide mononitrate to 15 mg daily given significant headaches at higher doses. Aggressive secondary prevention of coronary artery disease. Anticipate same-day discharge if no complications during 6-hour post-PCI monitoring period.   Nelva Bush, MD [facility]  Diagnostic Dominance: Left  Intervention     History of Present Illness     Tammie Smith is a 66 y.o. female with  history of coronary artery disease with recent NSTEMI status post PCI to subtotally occluded OM1 (significant residual disease involving the LAD and nondominant RCA not intervened upon at the time), hyperlipidemia, type 2 diabetes mellitus, carotid artery disease, degenerative disc disease, and esophagitis who presents for scheduled cath.   Ms.  Smith is feeling somewhat better following PCI to subtotally occluded OM branch in the setting of her NSTEMI earlier this month.  However, she continues to have CCS class IV angina in the setting of her severe mid LAD disease.   Cardiac catheterization was arranged for further evaluation.  Hospital Course     The patient underwent cardiac cath as noted above with patent overlapping OM1 stent. Severe LAD disease with sequential 50%, 80-90%, and 50% mid to distal vessel lesions as well as 90% stenosis involving small D3 branch. S/p successful IVUS-guided PCI to 80-90% mid LAD stenosis using Synergy 2.75 x 20 mm drug-eluting stent (postdilated to 3.1 mm) with 0% residual stenosis and TIMI-3 flow.  Plan for DAPT with ASA/ticagrelor for at least 12 months. The patient was seen by cardiac rehab while in short stay. There were no observed complications post cath. Radial cath site was re-evaluated prior to discharge and found to be stable without any complications. Instructions/precautions regarding cath site care were given prior to discharge.  Tammie Smith was seen by Dr. Saunders Revel and determined stable for discharge home. Follow up with our office has been arranged. Medications are listed below. Pertinent changes include reduce Imdur to 57m qd due to headache.  Cath/PCI Registry Performance & Quality Measures: Aspirin prescribed? - Yes ADP Receptor Inhibitor (Plavix/Clopidogrel, Brilinta/Ticagrelor or Effient/Prasugrel) prescribed (includes medically managed patients)? - Yes High Intensity Statin (Lipitor 40-870mor Crestor 20-4062mprescribed? - Yes For EF <40%, was ACEI/ARB prescribed? - Not Applicable (EF >/= 40%98%or EF <40%, Aldosterone Antagonist (Spironolactone or Eplerenone) prescribed? - Not Applicable (EF >/= 40%33%ardiac Rehab Phase II ordered (Included Medically managed Patients)? - Yes  Discharge Vitals Blood pressure 117/68, pulse 70, temperature  97.8 F (36.6 C), temperature source  Temporal, resp. rate 16, height 5' 3" (1.6 m), weight 66.2 kg, SpO2 96 %.  Filed Weights   09/13/22 0542  Weight: 66.2 kg    Last Labs & Radiologic Studies    High Sensitivity Troponin:   Recent Labs  Lab 08/16/22 1357 08/16/22 1606  TROPONINIHS 38* 44*   _____________  CARDIAC CATHETERIZATION  Result Date: 09/13/2022 Conclusions: Severe LAD disease with sequential 50%, 80-90%, and 50% mid to distal vessel lesions as well as 90% stenosis involving small D3 branch. Widely patent overlapping OM1 stents. Normal left ventricular filling pressure (LVEDP 14 mmHg). Successful IVUS-guided PCI to 80-90% mid LAD stenosis using Synergy 2.75 x 20 mm drug-eluting stent (postdilated to 3.1 mm) with 0% residual stenosis and TIMI-3 flow. Recommendations: Continue dual antiplatelet therapy with aspirin and ticagrelor for at least 12 months from recent NSTEMI. Decrease isosorbide mononitrate to 15 mg daily given significant headaches at higher doses. Aggressive secondary prevention of coronary artery disease. Anticipate same-day discharge if no complications during 6-hour post-PCI monitoring period. Nelva Bush, MD [facility]  CARDIAC CATHETERIZATION  Result Date: 08/17/2022 Conclusions: Severe three-vessel coronary artery disease, including long segment of calcified mid LAD disease of up to 80%, 50% distal LAD stenosis, subtotal occlusion (99% with TIMI-1 flow) of OM1, and 70% proximal stenosis of small, non-dominant RCA. Mildly elevated left ventricular filling pressure (LVEDP 17 mmHg). Successful PCI to OM1 using overlapping Onyx Frontier 2.25 x 22 mm and 2.0 x 12 mm drug-eluting stents with 0% residual stenosis and TIMI-3.  Initial stent placement was complicated by distal edge dissection successful treated with overlapping stent at the distal margin. Recommendations: Dual antiplatelet therapy with aspirin and ticagrelor for at least 12 months. Aggressive secondary prevention.  We have agreed to  rechallenge Tammie Smith with statin therapy. Plan for medical management of LAD disease with further discussion of staged PCI to be done at follow-up.  We will continue recently added propranolol and also start isosorbide mononitrate. Outpatient cardiac rehab referral has been placed. Nelva Bush, MD [facility]  ECHOCARDIOGRAM COMPLETE  Result Date: 08/17/2022    ECHOCARDIOGRAM REPORT   Patient Name:   NEVAE PINNIX Date of Exam: 08/17/2022 Medical Rec #:  [id number]       Height:       63.0 in Accession #:    [id number]      Weight:       148.0 lb Date of Birth:  09/11/1956       BSA:          1.701 m Patient Age:    66 years        BP:           125/82 mmHg Patient Gender: F               HR:           60 bpm. Exam Location:  [facility] Procedure: 2D Echo, Cardiac Doppler, Color Doppler and Intracardiac            Opacification Agent Indications:     R06.00 Dyspnea                  R07.9 Chest pain                  Elevated Troponin  History:         Patient has no prior history of Echocardiogram examinations.  Risk Factors:Hypertension, Diabetes and Dyslipidemia.  Sonographer:     Rosalia Hammers Referring Phys:  [id number] AMY N COX Diagnosing Phys: Nelva Bush MD IMPRESSIONS  1. Left ventricular ejection fraction, by estimation, is 55 to 60%. The left ventricle has normal function. The left ventricle demonstrates regional wall motion abnormalities (see scoring diagram/findings for description). There is mild left ventricular  hypertrophy. Left ventricular diastolic parameters are consistent with Grade I diastolic dysfunction (impaired relaxation). Elevated left atrial pressure. There is moderate hypokinesis of the left ventricular, basal-mid inferolateral wall.  2. Right ventricular systolic function is normal. The right ventricular size is normal. Tricuspid regurgitation signal is inadequate for assessing PA pressure.  3. The mitral valve is normal in structure. Trivial mitral valve  regurgitation.  4. The aortic valve is tricuspid. There is mild thickening of the aortic valve. Aortic valve regurgitation is mild.  5. The inferior vena cava is normal in size with <50% respiratory variability, suggesting right atrial pressure of 8 mmHg. FINDINGS  Left Ventricle: Left ventricular ejection fraction, by estimation, is 55 to 60%. The left ventricle has normal function. The left ventricle demonstrates regional wall motion abnormalities. Moderate hypokinesis of the left ventricular, basal-mid inferolateral wall. Definity contrast agent was given IV to delineate the left ventricular endocardial borders. The left ventricular internal cavity size was normal in size. There is mild left ventricular hypertrophy. Left ventricular diastolic parameters are consistent with Grade I diastolic dysfunction (impaired relaxation). Elevated left atrial pressure. Right Ventricle: The right ventricular size is normal. No increase in right ventricular wall thickness. Right ventricular systolic function is normal. Tricuspid regurgitation signal is inadequate for assessing PA pressure. Left Atrium: Left atrial size was normal in size. Right Atrium: Right atrial size was normal in size. Pericardium: There is no evidence of pericardial effusion. Mitral Valve: The mitral valve is normal in structure. Trivial mitral valve regurgitation. Tricuspid Valve: The tricuspid valve is normal in structure. Tricuspid valve regurgitation is trivial. Aortic Valve: The aortic valve is tricuspid. There is mild thickening of the aortic valve. Aortic valve regurgitation is mild. Aortic regurgitation PHT measures 455 msec. Aortic valve mean gradient measures 4.0 mmHg. Aortic valve peak gradient measures 6.2 mmHg. Aortic valve area, by VTI measures 1.76 cm. Pulmonic Valve: The pulmonic valve was normal in structure. Pulmonic valve regurgitation is trivial. No evidence of pulmonic stenosis. Aorta: The aortic root and ascending aorta are  structurally normal, with no evidence of dilitation. Pulmonary Artery: The pulmonary artery is of normal size. Venous: The inferior vena cava is normal in size with less than 50% respiratory variability, suggesting right atrial pressure of 8 mmHg. IAS/Shunts: No atrial level shunt detected by color flow Doppler.  LEFT VENTRICLE PLAX 2D LVIDd:         4.16 cm   Diastology LVIDs:         2.43 cm   LV e' medial:    6.09 cm/s LV PW:         1.28 cm   LV E/e' medial:  15.4 LV IVS:        1.25 cm   LV e' lateral:   6.96 cm/s LVOT diam:     1.60 cm   LV E/e' lateral: 13.4 LV SV:         50 LV SV Index:   30 LVOT Area:     2.01 cm  RIGHT VENTRICLE RV Basal diam:  2.55 cm RV S prime:     10.70 cm/s TAPSE (M-mode): 1.8 cm  LEFT ATRIUM             Index        RIGHT ATRIUM           Index LA diam:        3.30 cm 1.94 cm/m   RA Area:     12.60 cm LA Vol (A2C):   24.3 ml 14.28 ml/m  RA Volume:   27.50 ml  16.16 ml/m LA Vol (A4C):   25.3 ml 14.87 ml/m LA Biplane Vol: 26.6 ml 15.63 ml/m  AORTIC VALVE AV Area (Vmax):    1.77 cm AV Area (Vmean):   1.62 cm AV Area (VTI):     1.76 cm AV Vmax:           124.00 cm/s AV Vmean:          90.300 cm/s AV VTI:            0.286 m AV Peak Grad:      6.2 mmHg AV Mean Grad:      4.0 mmHg LVOT Vmax:         109.00 cm/s LVOT Vmean:        72.800 cm/s LVOT VTI:          0.251 m LVOT/AV VTI ratio: 0.88 AI PHT:            455 msec  AORTA Ao Root diam: 3.30 cm MITRAL VALVE MV Area (PHT): 3.48 cm     SHUNTS MV Decel Time: 218 msec     Systemic VTI:  0.25 m MV E velocity: 93.60 cm/s   Systemic Diam: 1.60 cm MV A velocity: 115.00 cm/s MV E/A ratio:  0.81 Christopher End MD Electronically signed by Nelva Bush MD Signature Date/Time: 08/17/2022/2:13:43 PM    Final    CT Angio Chest/Abd/Pel for Dissection W and/or W/WO  Result Date: 08/16/2022 CLINICAL DATA:  Chest pain radiating to the EXAM: CT ANGIOGRAPHY CHEST, ABDOMEN AND PELVIS TECHNIQUE: Non-contrast CT of the chest was initially  obtained. Multidetector CT imaging through the chest, abdomen and pelvis was performed using the standard protocol during bolus administration of intravenous contrast. Multiplanar reconstructed images and MIPs were obtained and reviewed to evaluate the vascular anatomy. RADIATION DOSE REDUCTION: This exam was performed according to the departmental dose-optimization program which includes automated exposure control, adjustment of the mA and/or kV according to patient size and/or use of iterative reconstruction technique. CONTRAST:  132m OMNIPAQUE IOHEXOL 350 MG/ML SOLN COMPARISON:  Chest x-ray 08/16/2022 FINDINGS: CTA CHEST FINDINGS Cardiovascular: Non contrasted images of the chest demonstrate no acute intramural hematoma. Mild aortic atherosclerosis. No aneurysm. Coronary vascular calcification. No dissection is seen. Normal cardiac size. No pericardial effusion Mediastinum/Nodes: No enlarged mediastinal, hilar, or axillary lymph nodes. Thyroid gland, trachea, and esophagus demonstrate no significant findings. Lungs/Pleura: Lungs are clear. No pleural effusion or pneumothorax. Musculoskeletal: No chest wall abnormality. No acute or significant osseous findings. Review of the MIP images confirms the above findings. CTA ABDOMEN AND PELVIS FINDINGS VASCULAR Aorta: Normal caliber aorta without aneurysm, dissection, vasculitis or significant stenosis. Mild aortic atherosclerosis. Celiac: Patent without evidence of aneurysm, dissection, vasculitis or significant stenosis. SMA: Patent without evidence of aneurysm, dissection, vasculitis or significant stenosis. Renals: Both renal arteries are patent without evidence of aneurysm, dissection, vasculitis, fibromuscular dysplasia or significant stenosis. IMA: Patent without evidence of aneurysm, dissection, vasculitis or significant stenosis. Inflow: Patent without evidence of aneurysm, dissection, vasculitis or significant stenosis. Review of the MIP images confirms the  above findings. NON-VASCULAR Hepatobiliary: No focal liver abnormality is seen. Status post cholecystectomy. No biliary dilatation. Pancreas: Unremarkable. No pancreatic ductal dilatation or surrounding inflammatory changes. Spleen: Normal in size without focal abnormality. Adrenals/Urinary Tract: Adrenal glands are unremarkable. Kidneys are normal, without renal calculi, focal lesion, or hydronephrosis. Bladder is unremarkable. Stomach/Bowel: Stomach is within normal limits. Appendix not well seen but no right lower quadrant inflammation. No evidence of bowel wall thickening, distention, or inflammatory changes. Lymphatic: No suspicious lymph nodes Reproductive: Status post hysterectomy. No adnexal masses. Other: No abdominal wall hernia or abnormality. No abdominopelvic ascites. Musculoskeletal: No acute or significant osseous findings. Review of the MIP images confirms the above findings. IMPRESSION: 1. Negative for acute aortic dissection or aneurysm. 2. Mild atherosclerosis without significant stenosis or acute occlusive disease. 3. No CT evidence for acute intra-abdominal or pelvic abnormality. Electronically Signed   By: Donavan Foil M.D.   On: 08/16/2022 20:53   DG Chest 2 View  Result Date: 08/16/2022 CLINICAL DATA:  Chest pain. EXAM: CHEST - 2 VIEW COMPARISON:  Chest x-ray January 20, 2013. FINDINGS: The heart size and mediastinal contours are within normal limits. Both lungs are clear. No visible pleural effusions or pneumothorax. No acute osseous abnormality. Right upper quadrant clips. IMPRESSION: No active cardiopulmonary disease. Electronically Signed   By: Margaretha Sheffield M.D.   On: 08/16/2022 14:39    Disposition   Pt is being discharged home today in good condition.  Follow-up Plans & Appointments     Discharge Instructions     Amb Referral to Cardiac Rehabilitation   Complete by: As directed    Diagnosis: Coronary Stents   After initial evaluation and assessments completed:  Virtual Based Care may be provided alone or in conjunction with Phase 2 Cardiac Rehab based on patient barriers.: Yes   Intensive Cardiac Rehabilitation (ICR) [place] location only OR Traditional Cardiac Rehabilitation (TCR) *If criteria for ICR are not met will enroll in TCR [facility] only): Yes        Discharge Medications   Allergies as of 09/13/2022       Reactions   Demerol [meperidine] Nausea And Vomiting   Causes violent vomiting   Codeine Nausea And Vomiting   Crestor [rosuvastatin]    Abdominal pain, muscle pain   Prilosec [omeprazole]    Abdominal pain, muscle pain        Medication List     TAKE these medications    aspirin EC 81 MG tablet Take 1 tablet (81 mg total) by mouth daily. Swallow whole.   atorvastatin 40 MG tablet Commonly known as: LIPITOR Take 1 tablet (40 mg total) by mouth daily.   DULoxetine 30 MG capsule Commonly known as: CYMBALTA Take 30 mg by mouth in the morning.   feeding supplement (GLUCERNA SHAKE) Liqd Take 237 mLs by mouth 3 (three) times daily between meals.   isosorbide mononitrate 30 MG 24 hr tablet Commonly known as: IMDUR Take 0.5 tablets (15 mg total) by mouth daily. What changed: how much to take   metFORMIN 500 MG 24 hr tablet Commonly known as: GLUCOPHAGE-XR Take 1 tablet (500 mg total) by mouth daily with supper. Start taking on: September 16, 2022 What changed: These instructions start on September 16, 2022. If you are unsure what to do until then, ask your doctor or other care provider.   nitroGLYCERIN 0.4 MG SL tablet Commonly known as: NITROSTAT Place 1 tablet (0.4 mg total) under the tongue every 5 (five) minutes as needed for chest  pain.   propranolol 40 MG tablet Commonly known as: INDERAL Take 40 mg by mouth 2 (two) times daily.   RABEprazole 20 MG tablet Commonly known as: ACIPHEX Take 20 mg by mouth at bedtime.   ticagrelor 90 MG Tabs tablet Commonly known as: BRILINTA Take 1 tablet (90 mg total) by mouth 2  (two) times daily. Notes to patient: TAKE EVENING DOSE            Allergies Allergies  Allergen Reactions   Demerol [Meperidine] Nausea And Vomiting    Causes violent vomiting   Codeine Nausea And Vomiting   Crestor [Rosuvastatin]     Abdominal pain, muscle pain   Prilosec [Omeprazole]     Abdominal pain, muscle pain    Outstanding Labs/Studies   N/A  Duration of Discharge Encounter   Greater than 30 minutes including physician time.  Signed, Leanor Kail, PA 09/13/2022, 2:08 PM

## 2022-09-13 NOTE — Interval H&P Note (Signed)
History and Physical Interval Note:  09/13/2022 7:05 AM  Tammie Smith  has presented today for surgery, with the diagnosis of stable angina.  The various methods of treatment have been discussed with the patient and family. After consideration of risks, benefits and other options for treatment, the patient has consented to  Procedure(s): CORONARY STENT INTERVENTION (N/A) as a surgical intervention.  The patient's history has been reviewed, patient examined, no change in status, stable for surgery.  I have reviewed the patient's chart and labs.  Questions were answered to the patient's satisfaction.    Cath Lab Visit (complete for each Cath Lab visit)  Clinical Evaluation Leading to the Procedure:   ACS: No.  Non-ACS:    Anginal Classification: CCS IV  Anti-ischemic medical therapy: Maximal Therapy (2 or more classes of medications)  Non-Invasive Test Results: No non-invasive testing performed  Prior CABG: No previous CABG  Tammie Smith

## 2022-09-13 NOTE — Progress Notes (Signed)
CARDIAC REHAB PHASE I   Pt post stent education including site care, heart healthy diabetic diet, risk factors, exercise guidelines, restrictions,antiplatelet therapy importance and CRP2 reviewed with pt. All questions and concerns addressed. Will refer to [facility] for CRP2.   [id number]  Vanessa Barbara, RN BSN 09/13/2022 11:52 AM

## 2022-09-14 ENCOUNTER — Encounter (HOSPITAL_COMMUNITY): Payer: Self-pay | Admitting: Internal Medicine

## 2022-09-17 ENCOUNTER — Encounter (HOSPITAL_COMMUNITY): Payer: Self-pay | Admitting: Internal Medicine

## 2022-09-25 NOTE — Progress Notes (Unsigned)
Cardiology Clinic Note   Patient Name: Tammie Smith Date of Encounter: 09/27/2022  Primary Care Provider:  Kirk Ruths, MD Primary Cardiologist:  Nelva Bush, MD  Patient Profile    66 year old female with a history of hyperlipidemia, DJD, diabetes type 2, carotid artery disease, esophagitis, and coronary artery disease with recent hospitalization, who is here today to follow-up on her CAD.  Past Medical History    Past Medical History:  Diagnosis Date   Borderline glaucoma    Cancer ([place])    skin cancer basal cell   Coronary artery disease 08/16/2022   NSTEMI s/p PCI to OM1   Degenerative disc disease, lumbar    Gestational diabetes    Hyperlipemia    Right-sided carotid artery disease ([place])    echo done   Past Surgical History:  Procedure Laterality Date   ABDOMINAL HYSTERECTOMY     APPENDECTOMY     AUGMENTATION MAMMAPLASTY Bilateral 04/05/1984   Pt had implants removed last time   CHOLECYSTECTOMY     COLONOSCOPY WITH PROPOFOL N/A 01/12/2016   Procedure: COLONOSCOPY WITH PROPOFOL;  Surgeon: Lollie Sails, MD;  Location: [facility] ENDOSCOPY;  Service: Endoscopy;  Laterality: N/A;   CORONARY STENT INTERVENTION N/A 08/17/2022   Procedure: CORONARY STENT INTERVENTION;  Surgeon: Nelva Bush, MD;  Location: [place] CV LAB;  Service: Cardiovascular;  Laterality: N/A;   CORONARY STENT INTERVENTION N/A 09/13/2022   Procedure: CORONARY STENT INTERVENTION;  Surgeon: Nelva Bush, MD;  Location: [place] [facility];  Service: Cardiovascular;  Laterality: N/A;   ESOPHAGOGASTRODUODENOSCOPY (EGD) WITH PROPOFOL N/A 09/18/2019   Procedure: ESOPHAGOGASTRODUODENOSCOPY (EGD) WITH PROPOFOL;  Surgeon: Lollie Sails, MD;  Location: [facility] ENDOSCOPY;  Service: Endoscopy;  Laterality: N/A;   INTRAVASCULAR ULTRASOUND/IVUS N/A 09/13/2022   Procedure: Intravascular Ultrasound/IVUS;  Surgeon: Nelva Bush, MD;  Location: [place] [facility] LAB;  Service:  Cardiovascular;  Laterality: N/A;   LEFT HEART CATH AND CORONARY ANGIOGRAPHY N/A 08/17/2022   Procedure: LEFT HEART CATH AND CORONARY ANGIOGRAPHY;  Surgeon: Nelva Bush, MD;  Location: [place] [facility];  Service: Cardiovascular;  Laterality: N/A;   LEFT HEART CATH AND CORONARY ANGIOGRAPHY N/A 09/13/2022   Procedure: LEFT HEART CATH AND CORONARY ANGIOGRAPHY;  Surgeon: Nelva Bush, MD;  Location: [place] [facility] LAB;  Service: Cardiovascular;  Laterality: N/A;    Allergies  Allergies  Allergen Reactions   Demerol [Meperidine] Nausea And Vomiting    Causes violent vomiting   Codeine Nausea And Vomiting   Crestor [Rosuvastatin]     Abdominal pain, muscle pain   Prilosec [Omeprazole]     Abdominal pain, muscle pain    History of Present Illness    Tammie Smith is a 66 year old female with a past medical history of coronary artery disease with recent NSTEMI status post PCI to a subtotally occluded OM1 with significant residual disease involving the LAD and nondominant RCA, hyperlipidemia, type 2 diabetes, carotid artery disease, degenerative disc disease, and esophagitis.  She previously seen Dr. Clayborn Bigness in 2019 for palpitations.  She did have a Myoview Lexiscan in 2017 showing normal wall motion LVEF 60% and no ischemia.  Echocardiogram 2017 showed LVEF 55%, mild LVH, normal LV function, and mild valvular regurgitation.  Presented to the [facility] emergency department on 08/16/2022 for chest pain.  Started out with midsternal chest pain radiating into her back.  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  She was very stressed and felt stress  may have been the culprit of her chest discomfort however chest pain continued to worsen over the last 2 weeks.  She had also noted that when she was mowing the yard she had very high blood pressures.  Emergency department blood pressure was 150/93, pulse of 80, labs revealed high-sensitivity troponin of 38 and  44, sodium 140 potassium 4.1, bicarb 24, serum creatinine 0.77, WBC 6.6, hemoglobin 13.8, platelets of 322.  EKG showed normal sinus rhythm with no ischemic changes.  Chest x-ray showed no active disease, CTA of the chest/abdomen/pelvis showed no acute dissection or aneurysm.  She was given aspirin 324 mg and was started on IV heparin and admitted.  She underwent left heart catheterization on 08/17/2022 which revealed severe three-vessel coronary artery disease including long segment calcified mid LAD disease of up to 80%, 50% distal LAD stenosis, subtotal occlusion of the OM1, and 70% proximal stenosis in the small, nondominant RCA, mildly elevated left ventricular filling pressure and 70 mmHg, and successful PCI to OM1 with overlapping DES.  Recommendations were dual antiplatelet therapy with aspirin and Brilinta for minimum of 12 months aggressive secondary prevention and plan for medical management of LAD disease with further discussion of staged PCI to be done at follow-up.  She was last seen in clinic by Dr. Saunders Revel on 09/02/2022 and reports continued to have chest tightness, followed, though she has not had any more episodes of severe chest discomfort that led to her recent hospitalization.  Time she was tolerating all of her medications well.  Continued chest pain that she had her isosorbide mononitrate was increased to 30 mg and she was set up for staged PCI to be done at [facility].  She underwent left heart catheterization at [facility] on 09/13/2022 which revealed severe LAD disease with sequential 50%, 80-90% and 50% mid distal vessel lesion as well as 90% stenosis proximal D3 branch, widely patent after 11 OM1 stents, normal left ventricular filling pressure (LVEDP 14 mmHg), with successful IVUS guided PCI to the 80/90% mid LAD stenosis with 0 residual stenosis and TIMI-3 flow. She will be continued on dual antiplatelet therapy with aspirin and Brilinta for at least 12 months for recent NSTEMI as well as  isosorbide mononitrate was decreased to 50 mg daily due to significant headaches and high doses, aggressive secondary prevention of coronary artery disease,  She returns to clinic today stating that she has been doing fairly well.  She denies any chest discomfort, chest tightness, shortness of breath, palpitations or dyspnea on exertion.  She stated that she was decrease her isosorbide back to 15 mg daily as she was intolerant to 30 mg daily due to headaches.  Since reducing her dose back to 50 mg daily her headaches have subsided.  She continues to tolerate the rest of her medications well even her statin therapy at this time.  She denies any recent hospitalizations or visits to the emergency department.  Home Medications    Current Outpatient Medications  Medication Sig Dispense Refill   aspirin EC 81 MG tablet Take 1 tablet (81 mg total) by mouth daily. Swallow whole. 30 tablet 12   atorvastatin (LIPITOR) 40 MG tablet Take 1 tablet (40 mg total) by mouth daily. 30 tablet 2   DULoxetine (CYMBALTA) 30 MG capsule Take 30 mg by mouth in the morning.     isosorbide mononitrate (IMDUR) 30 MG 24 hr tablet Take 0.5 tablets (15 mg total) by mouth daily. 30 tablet 3   metFORMIN (GLUCOPHAGE-XR) 500 MG  24 hr tablet Take 1 tablet (500 mg total) by mouth daily with supper.     nitroGLYCERIN (NITROSTAT) 0.4 MG SL tablet Place 1 tablet (0.4 mg total) under the tongue every 5 (five) minutes as needed for chest pain. 90 tablet 3   propranolol (INDERAL) 40 MG tablet Take 40 mg by mouth 2 (two) times daily.     RABEprazole (ACIPHEX) 20 MG tablet Take 20 mg by mouth at bedtime.     ticagrelor (BRILINTA) 90 MG TABS tablet Take 1 tablet (90 mg total) by mouth 2 (two) times daily. 60 tablet 11   feeding supplement, GLUCERNA SHAKE, (GLUCERNA SHAKE) LIQD Take 237 mLs by mouth 3 (three) times daily between meals. (Patient not taking: Reported on 09/07/2022)  0   No current facility-administered medications for this visit.      Family History    Family History  Problem Relation Age of Onset   Breast cancer Neg Hx    She indicated that her mother is alive. She indicated that her father is deceased. She indicated that the status of her neg hx is unknown.  Social History    Social History   Socioeconomic History   Marital status: Widowed    Spouse name: Not on file   Number of children: Not on file   Years of education: Not on file   Highest education level: Not on file  Occupational History   Not on file  Tobacco Use   Smoking status: Never   Smokeless tobacco: Never  Vaping Use   Vaping Use: Never used  Substance and Sexual Activity   Alcohol use: No   Drug use: Never   Sexual activity: Not Currently  Other Topics Concern   Not on file  Social History Narrative   Not on file   Social Determinants of Health   Financial Resource Strain: Not on file  Food Insecurity: No Food Insecurity (08/16/2022)   Hunger Vital Sign    Worried About Running Out of Food in the Last Year: Never true    Ran Out of Food in the Last Year: Never true  Transportation Needs: No Transportation Needs (08/16/2022)   PRAPARE - Hydrologist (Medical): No    Lack of Transportation (Non-Medical): No  Physical Activity: Not on file  Stress: Not on file  Social Connections: Not on file  Intimate Partner Violence: Not At Risk (08/16/2022)   Humiliation, Afraid, Rape, and Kick questionnaire    Fear of Current or Ex-Partner: No    Emotionally Abused: No    Physically Abused: No    Sexually Abused: No     Review of Systems    General:  No chills, fever, night sweats or weight changes.  Cardiovascular:  No chest pain, dyspnea on exertion, edema, orthopnea, palpitations, paroxysmal nocturnal dyspnea. Dermatological: No rash, lesions/masses Respiratory: No cough, dyspnea Urologic: No hematuria, dysuria Abdominal:   No nausea, vomiting, diarrhea, bright red blood per rectum, melena, or  hematemesis Neurologic:  No visual changes, wkns, changes in mental status.  Endorses headaches with increase isosorbide dosing All other systems reviewed and are otherwise negative except as noted above.    Cardiac Rehabilitation Eligibility Assessment  The patient has declined or is not appropriate for cardiac rehabilitation. (patient declines cardiac rehab currently)    Physical Exam    VS:  BP 110/68 (BP Location: Left Arm, Patient Position: Sitting, Cuff Size: Normal)   Pulse 72   Ht '5\' 3"'$  (1.6 m)  Wt 147 lb 3.2 oz (66.8 kg)   SpO2 95%   BMI 26.08 kg/m  , BMI Body mass index is 26.08 kg/m.     GEN: Well nourished, well developed, in no acute distress. HEENT: normal. Neck: Supple, no JVD, carotid bruits, or masses. Cardiac: RRR, no murmurs, rubs, or gallops. No clubbing, cyanosis, edema.  Radials/DP/PT 2+ and equal bilaterally.  Respiratory:  Respirations regular and unlabored, clear to auscultation bilaterally. GI: Soft, nontender, nondistended, BS + x 4. MS: no deformity or atrophy. Skin: warm and dry, no rash. Neuro:  Strength and sensation are intact. Psych: Normal affect.  Accessory Clinical Findings    ECG personally reviewed by me today-sinus rhythm rate of 72, left axis deviation- No acute changes  Lab Results  Component Value Date   WBC 5.1 08/18/2022   HGB 12.1 08/18/2022   HCT 37.0 08/18/2022   MCV 89.6 08/18/2022   PLT 263 08/18/2022   Lab Results  Component Value Date   CREATININE 0.75 08/18/2022   BUN 14 08/18/2022   NA 138 08/18/2022   K 4.2 08/18/2022   CL 109 08/18/2022   CO2 22 08/18/2022   Lab Results  Component Value Date   ALT 315 (H) 01/22/2013   AST 113 (H) 01/22/2013   ALKPHOS 149 (H) 01/22/2013   BILITOT 0.4 01/22/2013   Lab Results  Component Value Date   CHOL 248 (H) 08/18/2022   HDL 37 (L) 08/18/2022   LDLCALC 167 (H) 08/18/2022   TRIG 219 (H) 08/18/2022   CHOLHDL 6.7 08/18/2022    Lab Results  Component Value Date    HGBA1C 6.9 (H) 08/16/2022    Assessment & Plan   1.  Coronary artery disease of the native coronary artery with stable angina.  She underwent staged PCI to the subtotal occluded OM branch and stenting for NSTEMI earlier this month and continued to have CCS class IV angina in the setting of severe mid LAD disease and underwent staged PCI at [facility].  She was encouraged to participate in cardiac rehab which she currently declines at this time even after discussing the benefit and the increased activity monitoring that she is allowed for during cardiac rehab.  And cardiac rehab is improving to reduce readmissions.  We did have a sodium that she declined today at her appointment that it is still an option and is open to her should she change her mind on return.  She had decreased her isosorbide back to 15 mg a daily and is tolerating that well.  She was also restarted on statin therapy which she has been tolerating thus far.  She has been continued on aspirin 81 mg daily, atorvastatin 40 mg daily, Imdur 50 mg daily, and Brilinta 90 mg twice daily as well as her Nitrostat as needed.  EKG done today revealed no ischemic changes.  With restarting the manage challenged on statin she will need a repeat lipid and LFTs in another 2 months.  2.  Hyperlipidemia associated with type 2 diabetes the LDL elevated during her hospitalization 167.  She was previously intolerant of statins but seems to be tolerating the atorvastatin 40 mg without incident.  She will need to follow-up for lipid and hepatic in about 2 months.  3.  Diabetes.  She continues to be managed with metformin is continued to be followed by her PCP.  4.  Hypertension with blood pressure today 110/68.  Which remained stable she has been managed on propanolol 40 mg twice daily  5.  Disposition patient return to clinic to see MD/APP in 6 weeks or sooner if needed  Dennisha Mouser, NP 09/27/2022, 4:25 PM

## 2022-09-27 ENCOUNTER — Encounter: Payer: Self-pay | Admitting: Cardiology

## 2022-09-27 ENCOUNTER — Ambulatory Visit: Payer: Managed Care, Other (non HMO) | Attending: Cardiology | Admitting: Cardiology

## 2022-09-27 VITALS — BP 110/68 | HR 72 | Ht 63.0 in | Wt 147.2 lb

## 2022-09-27 DIAGNOSIS — I214 Non-ST elevation (NSTEMI) myocardial infarction: Secondary | ICD-10-CM

## 2022-09-27 DIAGNOSIS — I10 Essential (primary) hypertension: Secondary | ICD-10-CM | POA: Diagnosis not present

## 2022-09-27 DIAGNOSIS — E785 Hyperlipidemia, unspecified: Secondary | ICD-10-CM

## 2022-09-27 DIAGNOSIS — I25118 Atherosclerotic heart disease of native coronary artery with other forms of angina pectoris: Secondary | ICD-10-CM | POA: Diagnosis not present

## 2022-09-27 DIAGNOSIS — E1169 Type 2 diabetes mellitus with other specified complication: Secondary | ICD-10-CM

## 2022-09-27 NOTE — Patient Instructions (Signed)
Medication Instructions:   Your physician recommends that you continue on your current medications as directed. Please refer to the Current Medication list given to you today.  *If you need a refill on your cardiac medications before your next appointment, please call your pharmacy*   Lab Work:  You have been given a lab slip to have a lipid panel done at your convince. Please fast fro this lab.   If you have labs (blood work) drawn today and your tests are completely normal, you will receive your results only by: [place] (if you have MyChart) OR A paper copy in the mail If you have any lab test that is abnormal or we need to change your treatment, we will call you to review the results.   Testing/Procedures:  None Ordered   Follow-Up: At [facility], you and your health needs are our priority.  As part of our continuing mission to provide you with exceptional heart care, we have created designated Provider Care Teams.  These Care Teams include your primary Cardiologist (physician) and Advanced Practice Providers (APPs -  Physician Assistants and Nurse Practitioners) who all work together to provide you with the care you need, when you need it.  We recommend signing up for the patient portal called "MyChart".  Sign up information is provided on this After Visit Summary.  MyChart is used to connect with patients for Virtual Visits (Telemedicine).  Patients are able to view lab/test results, encounter notes, upcoming appointments, etc.  Non-urgent messages can be sent to your provider as well.   To learn more about what you can do with MyChart, go to [email or web address].    Your next appointment:   6 week(s)  The format for your next appointment:   In Person  Provider:   You may see Nelva Bush, MD or one of the following Advanced Practice Providers on your designated Care Team:   Murray Hodgkins, NP Christell Faith, PA-C Cadence Kathlen Mody, PA-C Gerrie Nordmann,  NP

## 2022-09-28 ENCOUNTER — Telehealth (HOSPITAL_BASED_OUTPATIENT_CLINIC_OR_DEPARTMENT_OTHER): Payer: Self-pay

## 2022-09-28 MED ORDER — ISOSORBIDE MONONITRATE ER 30 MG PO TB24: 15.0000 mg | ORAL_TABLET | Freq: Every day | ORAL | 3 refills | Status: DC

## 2022-09-28 MED ORDER — ASPIRIN 81 MG PO TBEC: 81.0000 mg | DELAYED_RELEASE_TABLET | Freq: Every day | ORAL | 3 refills | Status: DC

## 2022-09-28 MED ORDER — TICAGRELOR 90 MG PO TABS: 90.0000 mg | ORAL_TABLET | Freq: Two times a day (BID) | ORAL | 3 refills | Status: DC

## 2022-09-28 MED ORDER — ATORVASTATIN CALCIUM 40 MG PO TABS: 40.0000 mg | ORAL_TABLET | Freq: Every day | ORAL | 3 refills | Status: DC

## 2022-09-28 NOTE — Telephone Encounter (Addendum)
Rx sent to pharm on file    ----- Message from Gerrie Nordmann, NP sent at 09/27/2022  4:43 PM EDT ----- Regarding: refills Please send in 90 days with 3 refills for brilinta 90 mg bid, atrovastatin 40 mg daily, isosorbide mononitrate 15 mg daily (I think this one if 30 mg and they cut them in half), and asa '81mg'$  daily Thank you

## 2022-10-06 ENCOUNTER — Other Ambulatory Visit: Payer: Self-pay | Admitting: Obstetrics and Gynecology

## 2022-10-06 DIAGNOSIS — Z1231 Encounter for screening mammogram for malignant neoplasm of breast: Secondary | ICD-10-CM

## 2022-10-13 ENCOUNTER — Encounter: Payer: Self-pay | Admitting: Internal Medicine

## 2022-10-21 ENCOUNTER — Other Ambulatory Visit: Payer: Self-pay | Admitting: Internal Medicine

## 2022-10-22 LAB — LIPID PANEL
Chol/HDL Ratio: 4.4 ratio (ref 0.0–4.4)
Cholesterol, Total: 181 mg/dL (ref 100–199)
HDL: 41 mg/dL (ref >39)
LDL Chol Calc (NIH): 113 mg/dL — ABNORMAL HIGH (ref 0–99)
Triglycerides: 155 mg/dL — ABNORMAL HIGH (ref 0–149)
VLDL Cholesterol Cal: 27 mg/dL (ref 5–40)

## 2022-10-27 ENCOUNTER — Other Ambulatory Visit: Payer: Self-pay | Admitting: *Deleted

## 2022-10-27 DIAGNOSIS — E785 Hyperlipidemia, unspecified: Secondary | ICD-10-CM

## 2022-10-27 MED ORDER — ATORVASTATIN CALCIUM 80 MG PO TABS: 80.0000 mg | ORAL_TABLET | Freq: Every day | ORAL | 3 refills | Status: DC

## 2022-11-09 ENCOUNTER — Ambulatory Visit
Admission: RE | Admit: 2022-11-09 | Discharge: 2022-11-09 | Disposition: A | Discharge status: Home / Self Care | Payer: Managed Care, Other (non HMO) | Source: Ambulatory Visit | Attending: Obstetrics and Gynecology | Admitting: Obstetrics and Gynecology

## 2022-11-09 DIAGNOSIS — Z1231 Encounter for screening mammogram for malignant neoplasm of breast: Secondary | ICD-10-CM | POA: Diagnosis present

## 2022-11-10 ENCOUNTER — Encounter: Payer: Self-pay | Admitting: Medical

## 2022-11-10 ENCOUNTER — Ambulatory Visit: Payer: Managed Care, Other (non HMO) | Attending: Cardiology | Admitting: Medical

## 2022-11-10 VITALS — BP 108/64 | HR 83 | Ht 63.0 in | Wt 147.2 lb

## 2022-11-10 DIAGNOSIS — Z79899 Other long term (current) drug therapy: Secondary | ICD-10-CM

## 2022-11-10 DIAGNOSIS — I25118 Atherosclerotic heart disease of native coronary artery with other forms of angina pectoris: Secondary | ICD-10-CM | POA: Diagnosis not present

## 2022-11-10 DIAGNOSIS — I10 Essential (primary) hypertension: Secondary | ICD-10-CM

## 2022-11-10 DIAGNOSIS — E782 Mixed hyperlipidemia: Secondary | ICD-10-CM | POA: Diagnosis not present

## 2022-11-10 MED ORDER — ISOSORBIDE MONONITRATE ER 30 MG PO TB24: 15.0000 mg | ORAL_TABLET | Freq: Every day | ORAL | 3 refills | Status: DC

## 2022-11-10 NOTE — Progress Notes (Signed)
Cardiology Office Note:    Date:  11/10/2022   ID:  COVA KNIERIEM, DOB 11/05/1956, MRN [id number]  PCP:  Kirk Ruths, MD  [facility] Cardiologist:  Nelva Bush, MD  [facility] Electrophysiologist:  None   Referring MD: Kirk Ruths, MD   Chief Complaint: 6 week follow-up  History of Present Illness:    Tammie Smith is a 66 y.o. female with a hx of HLD, DJD, DM2, carotid artery disease, esophagitis, CAD who presents for 6 week follow-up.   She previously saw [facility] in 2019 for palpitations. She had a Myoview in 2017 showing normal LVEF 60% and no ishcemia. Echo in 2017 showed LVEF 55%, mild LVH, normal LVEF and mild MR.   Recent NSTEMI 08/2022 s/p PCI to subtotally occluded OM1 with significant residual disease involving the LAD and nondominant RCA. Patient reported residual chest pain at follow-up and Imdur was increased and she was set up for staged PCI.   She underwent LHC at [facility] 09/13/22 whic revealed severe LAD disease with sequential 50%, 80-90% and 50% mid distal vessel lesion as well as 90% stenosis proximal D3 branch, widely patent after Om1 stents, normal LV filing pressure, whic successful IUS guided PCI to 80-90% mid LAD stenosis. She was continued on DAPT with ASA and Brilinta for at least 12 months.   Last seen 09/27/22 and was doing well. She decrease Imdur back to '15mg'$  daily. She declined cardiac rehab.   Today, she is overall doing well. Patient is active at home and does yard work. No cardiac rehab. She is physical and does yard work. Needs refill of Imdur '15mg'$  daily. She needs scripts for her lab work. She is tolerating asa and Brilinta. She has been diet changes.   Past Medical History:  Diagnosis Date   Borderline glaucoma    Cancer ([place])    skin cancer basal cell   Coronary artery disease 08/16/2022   NSTEMI s/p PCI to OM1   Degenerative disc disease, lumbar    Gestational diabetes    Hyperlipemia    Right-sided carotid  artery disease ([place])    echo done    Past Surgical History:  Procedure Laterality Date   ABDOMINAL HYSTERECTOMY     APPENDECTOMY     AUGMENTATION MAMMAPLASTY Bilateral 04/05/1984   Pt had implants removed last time   CHOLECYSTECTOMY     COLONOSCOPY WITH PROPOFOL N/A 01/12/2016   Procedure: COLONOSCOPY WITH PROPOFOL;  Surgeon: Lollie Sails, MD;  Location: [facility] ENDOSCOPY;  Service: Endoscopy;  Laterality: N/A;   CORONARY STENT INTERVENTION N/A 08/17/2022   Procedure: CORONARY STENT INTERVENTION;  Surgeon: Nelva Bush, MD;  Location: [place] CV LAB;  Service: Cardiovascular;  Laterality: N/A;   CORONARY STENT INTERVENTION N/A 09/13/2022   Procedure: CORONARY STENT INTERVENTION;  Surgeon: Nelva Bush, MD;  Location: [place] CV LAB;  Service: Cardiovascular;  Laterality: N/A;   ESOPHAGOGASTRODUODENOSCOPY (EGD) WITH PROPOFOL N/A 09/18/2019   Procedure: ESOPHAGOGASTRODUODENOSCOPY (EGD) WITH PROPOFOL;  Surgeon: Lollie Sails, MD;  Location[facility] END[facility]COPY;  Service: Endoscopy;  Laterality: N/A;   INTRAVASCULAR ULTRASOUND/IVUS N/A 09/13/2022   Procedure: Intravascular Ultrasound/IVUS;  Surgeon: Nelva Bush, MD;  Location: [place] [facility] LAB;  Service: Cardiovascular;  Laterality: N/A;   LEFT HEART CATH AND CORONARY ANGIOGRAPHY N/A 08/17/2022   Procedure: LEFT HEART CATH AND CORONARY ANGIOGRAPHY;  Surgeon: Nelva Bush, MD;  Location: [place] [facility];  Service: Cardiovascular;  Laterality: N/A;   LEFT HEART CATH AND CORONARY ANGIOGRAPHY  N/A 09/13/2022   Procedure: LEFT HEART CATH AND CORONARY ANGIOGRAPHY;  Surgeon: Nelva Bush, MD;  Location: [place] CV LAB;  Service: Cardiovascular;  Laterality: N/A;    Current Medications: Current Meds  Medication Sig   aspirin EC 81 MG tablet Take 1 tablet (81 mg total) by mouth daily. Swallow whole.   atorvastatin (LIPITOR) 80 MG tablet Take 1 tablet (80 mg total) by mouth daily.   DULoxetine (CYMBALTA) 30 MG  capsule Take 30 mg by mouth in the morning.   metFORMIN (GLUCOPHAGE-XR) 500 MG 24 hr tablet Take 1 tablet (500 mg total) by mouth daily with supper.   nitroGLYCERIN (NITROSTAT) 0.4 MG SL tablet Place 1 tablet (0.4 mg total) under the tongue every 5 (five) minutes as needed for chest pain.   propranolol (INDERAL) 40 MG tablet Take 40 mg by mouth 2 (two) times daily.   RABEprazole (ACIPHEX) 20 MG tablet Take 20 mg by mouth at bedtime.   ticagrelor (BRILINTA) 90 MG TABS tablet Take 1 tablet (90 mg total) by mouth 2 (two) times daily.   [DISCONTINUED] isosorbide mononitrate (IMDUR) 30 MG 24 hr tablet Take 0.5 tablets (15 mg total) by mouth daily.     Allergies:   Demerol [meperidine], Codeine, Crestor [rosuvastatin], and Prilosec [omeprazole]   Social History   Socioeconomic History   Marital status: Widowed    Spouse name: Not on file   Number of children: Not on file   Years of education: Not on file   Highest education level: Not on file  Occupational History   Not on file  Tobacco Use   Smoking status: Never   Smokeless tobacco: Never  Vaping Use   Vaping Use: Never used  Substance and Sexual Activity   Alcohol use: No   Drug use: Never   Sexual activity: Not Currently  Other Topics Concern   Not on file  Social History Narrative   Not on file   Social Determinants of Health   Financial Resource Strain: Not on file  Food Insecurity: No Food Insecurity (08/16/2022)   Hunger Vital Sign    Worried About Running Out of Food in the Last Year: Never true    Ran Out of Food in the Last Year: Never true  Transportation Needs: No Transportation Needs (08/16/2022)   PRAPARE - Hydrologist (Medical): No    Lack of Transportation (Non-Medical): No  Physical Activity: Not on file  Stress: Not on file  Social Connections: Not on file     Family History: The patient's family history is negative for Breast cancer.  ROS:   Please see the history of  present illness.     All other systems reviewed and are negative.  EKGs/Labs/Other Studies Reviewed:    The following studies were reviewed today:  Cardiac cath 09/2022 Conclusions: Severe LAD disease with sequential 50%, 80-90%, and 50% mid to distal vessel lesions as well as 90% stenosis involving small D3 branch. Widely patent overlapping OM1 stents. Normal left ventricular filling pressure (LVEDP 14 mmHg). Successful IVUS-guided PCI to 80-90% mid LAD stenosis using Synergy 2.75 x 20 mm drug-eluting stent (postdilated to 3.1 mm) with 0% residual stenosis and TIMI-3 flow.   Recommendations: Continue dual antiplatelet therapy with aspirin and ticagrelor for at least 12 months from recent NSTEMI. Decrease isosorbide mononitrate to 15 mg daily given significant headaches at higher doses. Aggressive secondary prevention of coronary artery disease. Anticipate same-day discharge if no complications during 6-hour post-PCI monitoring  period.   Nelva Bush, MD [facility]  Coronary Diagrams  Diagnostic Dominance: Left  Intervention     Cardiac cath 08/17/22 Conclusions: Severe three-vessel coronary artery disease, including long segment of calcified mid LAD disease of up to 80%, 50% distal LAD stenosis, subtotal occlusion (99% with TIMI-1 flow) of OM1, and 70% proximal stenosis of small, non-dominant RCA. Mildly elevated left ventricular filling pressure (LVEDP 17 mmHg). Successful PCI to OM1 using overlapping Onyx Frontier 2.25 x 22 mm and 2.0 x 12 mm drug-eluting stents with 0% residual stenosis and TIMI-3.  Initial stent placement was complicated by distal edge dissection successful treated with overlapping stent at the distal margin.   Recommendations: Dual antiplatelet therapy with aspirin and ticagrelor for at least 12 months. Aggressive secondary prevention.  We have agreed to rechallenge Ms. Toothaker with statin therapy. Plan for medical management of LAD disease with  further discussion of staged PCI to be done at follow-up.  We will continue recently added propranolol and also start isosorbide mononitrate. Outpatient cardiac rehab referral has been placed.   Nelva Bush, MD [facility]  Coronary Diagrams  Diagnostic Dominance: Left  Intervention      Echo 08/17/22  1. Left ventricular ejection fraction, by estimation, is 55 to 60%. The  left ventricle has normal function. The left ventricle demonstrates  regional wall motion abnormalities (see scoring diagram/findings for  description). There is mild left ventricular   hypertrophy. Left ventricular diastolic parameters are consistent with  Grade I diastolic dysfunction (impaired relaxation). Elevated left atrial  pressure. There is moderate hypokinesis of the left ventricular, basal-mid  inferolateral wall.   2. Right ventricular systolic function is normal. The right ventricular  size is normal. Tricuspid regurgitation signal is inadequate for assessing  PA pressure.   3. The mitral valve is normal in structure. Trivial mitral valve  regurgitation.   4. The aortic valve is tricuspid. There is mild thickening of the aortic  valve. Aortic valve regurgitation is mild.   5. The inferior vena cava is normal in size with <50% respiratory  variability, suggesting right atrial pressure of 8 mmHg.    EKG:  EKG is not ordered today.    Recent Labs: 08/16/2022: B Natriuretic Peptide 157.4 08/18/2022: BUN 14; Creatinine, Ser 0.75; Hemoglobin 12.1; Platelets 263; Potassium 4.2; Sodium 138  Recent Lipid Panel    Component Value Date/Time   CHOL 181 10/21/2022 0825   TRIG 155 (H) 10/21/2022 0825   HDL 41 10/21/2022 0825   CHOLHDL 4.4 10/21/2022 0825   CHOLHDL 6.7 08/18/2022 0717   VLDL 44 (H) 08/18/2022 0717   LDLCALC 113 (H) 10/21/2022 0825     Physical Exam:    VS:  BP 108/64 (BP Location: Left Arm, Patient Position: Sitting, Cuff Size: Normal)   Pulse 83   Ht '5\' 3"'$  (1.6 m)   Wt  147 lb 4 oz (66.8 kg)   SpO2 98%   BMI 26.08 kg/m     Wt Readings from Last 3 Encounters:  11/10/22 147 lb 4 oz (66.8 kg)  09/27/22 147 lb 3.2 oz (66.8 kg)  09/13/22 146 lb (66.2 kg)     GEN:  Well nourished, well developed in no acute distress HEENT: Normal NECK: No JVD; No carotid bruits LYMPHATICS: No lymphadenopathy CARDIAC: RRR, no murmurs, rubs, gallops RESPIRATORY:  Clear to auscultation without rales, wheezing or rhonchi  ABDOMEN: Soft, non-tender, non-distended MUSCULOSKELETAL:  No edema; No deformity  SKIN: Warm and dry NEUROLOGIC:  Alert and oriented  x 3 PSYCHIATRIC:  Normal affect   ASSESSMENT:    1. Coronary artery disease of native artery of native heart with stable angina pectoris ([place])   2. Medication management   3. Hyperlipidemia, mixed   4. Essential hypertension    PLAN:    In order of problems listed above:  CAD S/p NSTEMI with PCI to subtotally occluded OM1 and subsequent staged PCI to the mLAD. She is on DAPT for 12 months with Aspirin and Brilinta. She denies bleeding issues. I will check a CBC today. Patient does not want to do cardiac rehab. She reports she is staying active at home with house chores inside and outside. She denies anginal symptoms. Continue Aspirin, Brilinta, Imdur, Lipitor, Propranolol, and SL NTG. I will refill Imdur '15mg'$  daily. She did not tolerate the Imdur '30mg'$  due to headaches.   HLD Lipitor increased since the last visit with LDL 113, goal<70. I will give her a script to get fasting lipids/LFTs in 2 months.   HTN BP is good today, continue current medications.   DM2 Followed by PCP.   Disposition: Follow up in 3 month(s) with MD     Signed, Sarabelle Genson Ninfa Meeker, PA-C  11/10/2022 3:55 PM    [place]

## 2022-11-10 NOTE — Patient Instructions (Signed)
Medication Instructions:  Your Physician recommend you continue on your current medication as directed.    *If you need a refill on your cardiac medications before your next appointment, please call your pharmacy*   Lab Work: Your provider would like for you to have labs drawn today (CBC) return in 2 month to have the following labs drawn: (Lipid, LFT).   Please go to the [facility] entrance and check in at the front desk.  You do not need an appointment.  They are open from 7am-6 pm.  You will need to be fasting.  If you have labs (blood work) drawn today and your tests are completely normal, you will receive your results only by: [place] (if you have MyChart) OR A paper copy in the mail If you have any lab test that is abnormal or we need to change your treatment, we will call you to review the results.   Testing/Procedures: None ordered today   Follow-Up: At [facility], you and your health needs are our priority.  As part of our continuing mission to provide you with exceptional heart care, we have created designated Provider Care Teams.  These Care Teams include your primary Cardiologist (physician) and Advanced Practice Providers (APPs -  Physician Assistants and Nurse Practitioners) who all work together to provide you with the care you need, when you need it.  We recommend signing up for the patient portal called "MyChart".  Sign up information is provided on this After Visit Summary.  MyChart is used to connect with patients for Virtual Visits (Telemedicine).  Patients are able to view lab/test results, encounter notes, upcoming appointments, etc.  Non-urgent messages can be sent to your provider as well.   To learn more about what you can do with MyChart, go to [email or web address].    Your next appointment:   3 month(s)  The format for your next appointment:   In Person  Provider:   Nelva Bush, MD

## 2022-11-12 LAB — CBC
Hematocrit: 38.8 % (ref 34.0–46.6)
Hemoglobin: 13.1 g/dL (ref 11.1–15.9)
MCH: 30 pg (ref 26.6–33.0)
MCHC: 33.8 g/dL (ref 31.5–35.7)
MCV: 89 fL (ref 79–97)
Platelets: 312 10*3/uL (ref 150–450)
RBC: 4.37 x10E6/uL (ref 3.77–5.28)
RDW: 13.1 % (ref 11.7–15.4)
WBC: 5 10*3/uL (ref 3.4–10.8)

## 2022-11-23 ENCOUNTER — Encounter: Payer: Managed Care, Other (non HMO) | Attending: Internal Medicine | Admitting: *Deleted

## 2022-11-23 DIAGNOSIS — Z955 Presence of coronary angioplasty implant and graft: Secondary | ICD-10-CM | POA: Insufficient documentation

## 2022-11-23 DIAGNOSIS — I214 Non-ST elevation (NSTEMI) myocardial infarction: Secondary | ICD-10-CM

## 2022-11-23 DIAGNOSIS — I252 Old myocardial infarction: Secondary | ICD-10-CM | POA: Insufficient documentation

## 2022-11-23 DIAGNOSIS — Z48812 Encounter for surgical aftercare following surgery on the circulatory system: Secondary | ICD-10-CM | POA: Insufficient documentation

## 2022-11-23 NOTE — Progress Notes (Signed)
Initial phone call completed. Diagnosis can be found in [facility] 9/11. EP Orientation scheduled for Wednesday 12/20 at 1:30.

## 2022-11-24 ENCOUNTER — Encounter: Payer: Managed Care, Other (non HMO) | Admitting: *Deleted

## 2022-11-24 VITALS — Ht 63.25 in | Wt 146.0 lb

## 2022-11-24 DIAGNOSIS — Z955 Presence of coronary angioplasty implant and graft: Secondary | ICD-10-CM

## 2022-11-24 DIAGNOSIS — Z48812 Encounter for surgical aftercare following surgery on the circulatory system: Secondary | ICD-10-CM | POA: Diagnosis not present

## 2022-11-24 DIAGNOSIS — I252 Old myocardial infarction: Secondary | ICD-10-CM | POA: Diagnosis not present

## 2022-11-24 DIAGNOSIS — I214 Non-ST elevation (NSTEMI) myocardial infarction: Secondary | ICD-10-CM | POA: Diagnosis present

## 2022-11-24 NOTE — Progress Notes (Signed)
Cardiac Individual Treatment Plan  Patient Details  Name: Tammie Smith MRN: [id number] Date of Birth: [birth year] Referring Provider:   Flowsheet Row Cardiac Rehab from 11/24/2022 in [facility] Cardiac and Pulmonary Rehab  Referring Provider End       Initial Encounter Date:  Flowsheet Row Cardiac Rehab from 11/24/2022 in [facility] Cardiac and Pulmonary Rehab  Date 11/24/22       Visit Diagnosis: NSTEMI (non-ST elevated myocardial infarction) [facility])  Status post coronary artery stent placement  Patient's Home Medications on Admission:  Current Outpatient Medications:    aspirin EC 81 MG tablet, Take 1 tablet (81 mg total) by mouth daily. Swallow whole., Disp: 90 tablet, Rfl: 3   atorvastatin (LIPITOR) 80 MG tablet, Take 1 tablet (80 mg total) by mouth daily., Disp: 90 tablet, Rfl: 3   DULoxetine (CYMBALTA) 30 MG capsule, Take 30 mg by mouth in the morning., Disp: , Rfl:    isosorbide mononitrate (IMDUR) 30 MG 24 hr tablet, Take 0.5 tablets (15 mg total) by mouth daily., Disp: 45 tablet, Rfl: 3   metFORMIN (GLUCOPHAGE-XR) 500 MG 24 hr tablet, Take 1 tablet (500 mg total) by mouth daily with supper., Disp: , Rfl:    nitroGLYCERIN (NITROSTAT) 0.4 MG SL tablet, Place 1 tablet (0.4 mg total) under the tongue every 5 (five) minutes as needed for chest pain., Disp: 90 tablet, Rfl: 3   propranolol (INDERAL) 40 MG tablet, Take 40 mg by mouth 2 (two) times daily., Disp: , Rfl:    RABEprazole (ACIPHEX) 20 MG tablet, Take 20 mg by mouth at bedtime., Disp: , Rfl:    ticagrelor (BRILINTA) 90 MG TABS tablet, Take 1 tablet (90 mg total) by mouth 2 (two) times daily., Disp: 180 tablet, Rfl: 3  Past Medical History: Past Medical History:  Diagnosis Date   Borderline glaucoma    Cancer ([place])    skin cancer basal cell   Coronary artery disease 08/16/2022   NSTEMI s/p PCI to OM1   Degenerative disc disease, lumbar    Gestational diabetes    Hyperlipemia    Right-sided carotid artery disease (HCC)     echo done    Tobacco Use: Social History   Tobacco Use  Smoking Status Never  Smokeless Tobacco Never    Labs: Review Flowsheet       Latest Ref Rng & Units 08/16/2022 08/18/2022 10/21/2022  Labs for ITP Cardiac and Pulmonary Rehab  Cholestrol 100 - 199 mg/dL - 248  181   LDL (calc) 0 - 99 mg/dL - 167  113   HDL-C >39 mg/dL - 37  41   Trlycerides 0 - 149 mg/dL - 219  155   Hemoglobin A1c 4.8 - 5.6 % 6.9  - -     Exercise Target Goals: Exercise Program Goal: Individual exercise prescription set using results from initial 6 min walk test and THRR while considering  patient's activity barriers and safety.   Exercise Prescription Goal: Initial exercise prescription builds to 30-45 minutes a day of aerobic activity, 2-3 days per week.  Home exercise guidelines will be given to patient during program as part of exercise prescription that the participant will acknowledge.   Education: Aerobic Exercise: - Group verbal and visual presentation on the components of exercise prescription. Introduces F.I.T.T principle from ACSM for exercise prescriptions.  Reviews F.I.T.T. principles of aerobic exercise including progression. Written material given at graduation.   Education: Resistance Exercise: - Group verbal and visual presentation on the components of exercise prescription. Introduces  F.I.T.T principle from ACSM for exercise prescriptions  Reviews F.I.T.T. principles of resistance exercise including progression. Written material given at graduation.    Education: Exercise & Equipment Safety: - Individual verbal instruction and demonstration of equipment use and safety with use of the equipment. Flowsheet Row Cardiac Rehab from 11/24/2022 in [facility] Cardiac and Pulmonary Rehab  Date 11/24/22  Educator [facility]  Instruction Review Code 1- Verbalizes Understanding       Education: Exercise Physiology & General Exercise Guidelines: - Group verbal and written instruction with models to  review the exercise physiology of the cardiovascular system and associated critical values. Provides general exercise guidelines with specific guidelines to those with heart or lung disease.    Education: Flexibility, Balance, Mind/Body Relaxation: - Group verbal and visual presentation with interactive activity on the components of exercise prescription. Introduces F.I.T.T principle from ACSM for exercise prescriptions. Reviews F.I.T.T. principles of flexibility and balance exercise training including progression. Also discusses the mind body connection.  Reviews various relaxation techniques to help reduce and manage stress (i.e. Deep breathing, progressive muscle relaxation, and visualization). Balance handout provided to take home. Written material given at graduation.   Activity Barriers & Risk Stratification:  Activity Barriers & Cardiac Risk Stratification - 11/24/22 1145       Activity Barriers & Cardiac Risk Stratification   Activity Barriers Joint Problems;Back Problems    Cardiac Risk Stratification High             6 Minute Walk:  6 Minute Walk     Row Name 11/24/22 1143         6 Minute Walk   Phase Initial     Distance 1370 feet     Walk Time 6 minutes     # of Rest Breaks 0     MPH 2.59     METS 3.35     RPE 7     Perceived Dyspnea  0     VO2 Peak 11.72     Symptoms No     Resting HR 84 bpm     Resting BP 112/62     Resting Oxygen Saturation  95 %     Exercise Oxygen Saturation  during 6 min walk 97 %     Max Ex. HR 105 bpm     Max Ex. BP 136/70     2 Minute Post BP 106/62              Oxygen Initial Assessment:   Oxygen Re-Evaluation:   Oxygen Discharge (Final Oxygen Re-Evaluation):   Initial Exercise Prescription:  Initial Exercise Prescription - 11/24/22 1100       Date of Initial Exercise RX and Referring Provider   Date 11/24/22    Referring Provider End      Oxygen   Maintain Oxygen Saturation 88% or higher      Treadmill    MPH 3    Grade 1    Minutes 15    METs 3.71      Recumbant Bike   Level 2    RPM 50    Minutes 15    METs 3.35      Arm Ergometer   Level 1    RPM 30    Minutes 15    METs 3.35      REL-XR   Level 2    Speed 50    Minutes 15    METs 3.35      Track   Laps 42  Minutes 15    METs 3.25      Prescription Details   Frequency (times per week) 3    Duration Progress to 30 minutes of continuous aerobic without signs/symptoms of physical distress      Intensity   THRR 40-80% of Max Heartrate 112-140    Ratings of Perceived Exertion 11-13    Perceived Dyspnea 0-4      Progression   Progression Continue to progress workloads to maintain intensity without signs/symptoms of physical distress.      Resistance Training   Training Prescription Yes    Weight 3    Reps 10-15             Perform Capillary Blood Glucose checks as needed.  Exercise Prescription Changes:   Exercise Prescription Changes     Row Name 11/24/22 1100             Response to Exercise   Blood Pressure (Admit) 112/62       Blood Pressure (Exercise) 136/70       Blood Pressure (Exit) 106/62       Heart Rate (Admit) 84 bpm       Heart Rate (Exercise) 105 bpm       Heart Rate (Exit) 85 bpm       Oxygen Saturation (Admit) 95 %       Oxygen Saturation (Exercise) 97 %       Oxygen Saturation (Exit) 97 %       Rating of Perceived Exertion (Exercise) 7       Perceived Dyspnea (Exercise) 0       Symptoms none       Comments 6 MWT results                Exercise Comments:   Exercise Goals and Review:   Exercise Goals     Row Name 11/24/22 1149             Exercise Goals   Increase Physical Activity Yes       Intervention Provide advice, education, support and counseling about physical activity/exercise needs.;Develop an individualized exercise prescription for aerobic and resistive training based on initial evaluation findings, risk stratification, comorbidities and  participant's personal goals.       Expected Outcomes Short Term: Attend rehab on a regular basis to increase amount of physical activity.;Long Term: Add in home exercise to make exercise part of routine and to increase amount of physical activity.;Long Term: Exercising regularly at least 3-5 days a week.       Increase Strength and Stamina Yes       Intervention Provide advice, education, support and counseling about physical activity/exercise needs.;Develop an individualized exercise prescription for aerobic and resistive training based on initial evaluation findings, risk stratification, comorbidities and participant's personal goals.       Expected Outcomes Short Term: Increase workloads from initial exercise prescription for resistance, speed, and METs.;Short Term: Perform resistance training exercises routinely during rehab and add in resistance training at home;Long Term: Improve cardiorespiratory fitness, muscular endurance and strength as measured by increased METs and functional capacity (6MWT)       Able to understand and use rate of perceived exertion (RPE) scale Yes       Intervention Provide education and explanation on how to use RPE scale       Expected Outcomes Short Term: Able to use RPE daily in rehab to express subjective intensity level;Long Term:  Able to use RPE to  guide intensity level when exercising independently       Able to understand and use Dyspnea scale Yes       Intervention Provide education and explanation on how to use Dyspnea scale       Expected Outcomes Long Term: Able to use Dyspnea scale to guide intensity level when exercising independently;Short Term: Able to use Dyspnea scale daily in rehab to express subjective sense of shortness of breath during exertion       Knowledge and understanding of Target Heart Rate Range (THRR) Yes       Intervention Provide education and explanation of THRR including how the numbers were predicted and where they are located for  reference       Expected Outcomes Short Term: Able to state/look up THRR;Long Term: Able to use THRR to govern intensity when exercising independently;Short Term: Able to use daily as guideline for intensity in rehab       Able to check pulse independently Yes       Intervention Provide education and demonstration on how to check pulse in carotid and radial arteries.;Review the importance of being able to check your own pulse for safety during independent exercise       Expected Outcomes Short Term: Able to explain why pulse checking is important during independent exercise       Understanding of Exercise Prescription Yes       Intervention Provide education, explanation, and written materials on patient's individual exercise prescription       Expected Outcomes Short Term: Able to explain program exercise prescription;Long Term: Able to explain home exercise prescription to exercise independently                Exercise Goals Re-Evaluation :   Discharge Exercise Prescription (Final Exercise Prescription Changes):  Exercise Prescription Changes - 11/24/22 1100       Response to Exercise   Blood Pressure (Admit) 112/62    Blood Pressure (Exercise) 136/70    Blood Pressure (Exit) 106/62    Heart Rate (Admit) 84 bpm    Heart Rate (Exercise) 105 bpm    Heart Rate (Exit) 85 bpm    Oxygen Saturation (Admit) 95 %    Oxygen Saturation (Exercise) 97 %    Oxygen Saturation (Exit) 97 %    Rating of Perceived Exertion (Exercise) 7    Perceived Dyspnea (Exercise) 0    Symptoms none    Comments 6 MWT results             Nutrition:  Target Goals: Understanding of nutrition guidelines, daily intake of sodium '1500mg'$ , cholesterol '200mg'$ , calories 30% from fat and 7% or less from saturated fats, daily to have 5 or more servings of fruits and vegetables.  Education: All About Nutrition: -Group instruction provided by verbal, written material, interactive activities, discussions, models,  and posters to present general guidelines for heart healthy nutrition including fat, fiber, MyPlate, the role of sodium in heart healthy nutrition, utilization of the nutrition label, and utilization of this knowledge for meal planning. Follow up email sent as well. Written material given at graduation.   Biometrics:  Pre Biometrics - 11/24/22 1150       Pre Biometrics   Height 5' 3.25" (1.607 m)    Weight 146 lb (66.2 kg)    Waist Circumference 35 inches    Hip Circumference 40 inches    Waist to Hip Ratio 0.88 %    BMI (Calculated) 25.64    Single Leg  Stand 3.44 seconds              Nutrition Therapy Plan and Nutrition Goals:  Nutrition Therapy & Goals - 11/24/22 1114       Nutrition Therapy   Diet Heart healthy, low Na, T2DM    Drug/Food Interactions Statins/Certain Fruits    Protein (specify units) 55-65g    Fiber 25 grams    Whole Grain Foods 3 servings    Saturated Fats 12 max. grams    Fruits and Vegetables 8 servings/day    Sodium 2 grams      Personal Nutrition Goals   Nutrition Goal ST: review handouts LT: follow MyPlate guidelines, maintain A1C <7    Comments 66 y.o. F admitted to cardiac rehab s/p NSTEMI. PMHx includes HLD, DJD, T2DM, CAD, skin cancer (basal cell). Relevant medications reviewed. PYP Score: 65. Vegetables & Fruits 8/12. Breads, Grains & Cereals 8/12. Red & Processed Meat 10/12. Poultry 2/2. Fish & Shellfish 0/4. Beans, Nuts & Seeds 0/4. Milk & Dairy Foods 4/6. Toppings, Oils, Seasonings & Salt 16/20. Sweets, Snacks & Restaurant Food 7/14. Beverages 10/10. Tammie Smith reports that she is enjoying more bland food and she does not have much of an appetite. She reports getting back her normal eating pattern as her husband did not enjoy many heart healthy foods. B: 2 eggs and spinach frittata with rye toast and small amount of butter, hot oatbran cereal with blueberries.  L: example salad with baked chicken or [place] sandwich (arnolds whole grain bread with  nuts in it). She likes to treat herself to [employer] as well. D: sometimes does not eat. bowl of chicken noodle soup and bagel last night. She usually has sometime light. She reports her bigger meals are breakfast and lunch. Drinks: water. She uses olive oil most of the time. Smith reports cooking with some salt, but does not add any more. Discussed heart healthy eating and T2DM MNT.      Intervention Plan   Intervention Prescribe, educate and counsel regarding individualized specific dietary modifications aiming towards targeted core components such as weight, hypertension, lipid management, diabetes, heart failure and other comorbidities.;Nutrition handout(s) given to patient.    Expected Outcomes Short Term Goal: Understand basic principles of dietary content, such as calories, fat, sodium, cholesterol and nutrients.;Short Term Goal: A plan has been developed with personal nutrition goals set during dietitian appointment.;Long Term Goal: Adherence to prescribed nutrition plan.             Nutrition Assessments:  MEDIFICTS Score Key: ?70 Need to make dietary changes  40-70 Heart Healthy Diet ? 40 Therapeutic Level Cholesterol Diet  Flowsheet Row Cardiac Rehab from 11/23/2022 in [facility] Cardiac and Pulmonary Rehab  Picture Your Plate Total Score on Admission 65      Picture Your Plate Scores: <82 Unhealthy dietary pattern with much room for improvement. 41-50 Dietary pattern unlikely to meet recommendations for good health and room for improvement. 51-60 More healthful dietary pattern, with some room for improvement.  >60 Healthy dietary pattern, although there may be some specific behaviors that could be improved.    Nutrition Goals Re-Evaluation:   Nutrition Goals Discharge (Final Nutrition Goals Re-Evaluation):   Psychosocial: Target Goals: Acknowledge presence or absence of significant depression and/or stress, maximize coping skills, provide positive support system. Participant  is able to verbalize types and ability to use techniques and skills needed for reducing stress and depression.   Education: Stress, Anxiety, and Depression - Group verbal and visual presentation to  define topics covered.  Reviews how body is impacted by stress, anxiety, and depression.  Also discusses healthy ways to reduce stress and to treat/manage anxiety and depression.  Written material given at graduation.   Education: Sleep Hygiene -Provides group verbal and written instruction about how sleep can affect your health.  Define sleep hygiene, discuss sleep cycles and impact of sleep habits. Review good sleep hygiene tips.    Initial Review & Psychosocial Screening:  Initial Psych Review & Screening - 11/23/22 1008       Initial Review   Current issues with Current Stress Concerns;Current Depression    Source of Stress Concerns Family    Comments husband recently passed after a 2 year battle with esophagus cancer      Family Dynamics   Good Support System? Yes   three children     Barriers   Psychosocial barriers to participate in program There are no identifiable barriers or psychosocial needs.;The patient should benefit from training in stress management and relaxation.      Screening Interventions   Interventions Encouraged to exercise;Provide feedback about the scores to participant;To provide support and resources with identified psychosocial needs    Expected Outcomes Short Term goal: Utilizing psychosocial counselor, staff and physician to assist with identification of specific Stressors or current issues interfering with healing process. Setting desired goal for each stressor or current issue identified.;Long Term Goal: Stressors or current issues are controlled or eliminated.;Short Term goal: Identification and review with participant of any Quality of Life or Depression concerns found by scoring the questionnaire.;Long Term goal: The participant improves quality of Life and  PHQ9 Scores as seen by post scores and/or verbalization of changes             Quality of Life Scores:   Quality of Life - 11/23/22 1031       Quality of Life   Select Quality of Life      Quality of Life Scores   Health/Function Pre 27.17 %    Socioeconomic Pre 23.94 %    Psych/Spiritual Pre 27.57 %    Family Pre 27 %    GLOBAL Pre 26.49 %            Scores of 19 and below usually indicate a poorer quality of life in these areas.  A difference of  2-3 points is a clinically meaningful difference.  A difference of 2-3 points in the total score of the Quality of Life Index has been associated with significant improvement in overall quality of life, self-image, physical symptoms, and general health in studies assessing change in quality of life.  PHQ-9: Review Flowsheet       11/24/2022  Depression screen PHQ 2/9  Decreased Interest 3  Down, Depressed, Hopeless 2  PHQ - 2 Score 5  Altered sleeping 2  Tired, decreased energy 3  Change in appetite 3  Feeling bad or failure about yourself  0  Trouble concentrating 1  Moving slowly or fidgety/restless 0  Suicidal thoughts 0  PHQ-9 Score 14  Difficult doing work/chores Very difficult   Interpretation of Total Score  Total Score Depression Severity:  1-4 = Minimal depression, 5-9 = Mild depression, 10-14 = Moderate depression, 15-19 = Moderately severe depression, 20-27 = Severe depression   Psychosocial Evaluation and Intervention:  Psychosocial Evaluation - 11/23/22 1026       Psychosocial Evaluation & Interventions   Interventions Encouraged to exercise with the program and follow exercise prescription;Stress management education;Relaxation education  Comments Tammie is coming to cardiac rehab after stents that were done at two separate procedures. Her first stent was in September and was the week after her husband passed away after battling 2 years with esophagus cancer. She was his main caretaker and mentioned  her whole schedule was around his needs. So now she is trying to find a "new normal." She is still working and has her 3 children as her main support system. Her heart trouble started right after the passing of her husband so she is tired and ready for a new routine where she can feel better. Her sleep has suffered for a while now, where she is up every few hours. She thinks that might have been because she had to be up giving him pain medicine and such, but now her body just can't rest longer than about 4 hours. She is hoping this program will help boost her stamina and increase her knowledge on heart healthy living.    Expected Outcomes Short: attend cardiac rehab for education and exercise. Long: develop and maintain positive self care habits.    Continue Psychosocial Services  Follow up required by staff             Psychosocial Re-Evaluation:   Psychosocial Discharge (Final Psychosocial Re-Evaluation):   Vocational Rehabilitation: Provide vocational rehab assistance to qualifying candidates.   Vocational Rehab Evaluation & Intervention:  Vocational Rehab - 11/23/22 1008       Initial Vocational Rehab Evaluation & Intervention   Assessment shows need for Vocational Rehabilitation No             Education: Education Goals: Education classes will be provided on a variety of topics geared toward better understanding of heart health and risk factor modification. Participant will state understanding/return demonstration of topics presented as noted by education test scores.  Learning Barriers/Preferences:  Learning Barriers/Preferences - 11/23/22 1008       Learning Barriers/Preferences   Learning Barriers None    Learning Preferences None             General Cardiac Education Topics:  AED/CPR: - Group verbal and written instruction with the use of models to demonstrate the basic use of the AED with the basic ABC's of resuscitation.   Anatomy and Cardiac  Procedures: - Group verbal and visual presentation and models provide information about basic cardiac anatomy and function. Reviews the testing methods done to diagnose heart disease and the outcomes of the test results. Describes the treatment choices: Medical Management, Angioplasty, or Coronary Bypass Surgery for treating various heart conditions including Myocardial Infarction, Angina, Valve Disease, and Cardiac Arrhythmias.  Written material given at graduation. Flowsheet Row Cardiac Rehab from 11/24/2022 in [facility] Cardiac and Pulmonary Rehab  Education need identified 11/24/22       Medication Safety: - Group verbal and visual instruction to review commonly prescribed medications for heart and lung disease. Reviews the medication, class of the drug, and side effects. Includes the steps to properly store meds and maintain the prescription regimen.  Written material given at graduation.   Intimacy: - Group verbal instruction through game format to discuss how heart and lung disease can affect sexual intimacy. Written material given at graduation..   Know Your Numbers and Heart Failure: - Group verbal and visual instruction to discuss disease risk factors for cardiac and pulmonary disease and treatment options.  Reviews associated critical values for Overweight/Obesity, Hypertension, Cholesterol, and Diabetes.  Discusses basics of heart failure: signs/symptoms and treatments.  Introduces  Heart Failure Zone chart for action plan for heart failure.  Written material given at graduation.   Infection Prevention: - Provides verbal and written material to individual with discussion of infection control including proper hand washing and proper equipment cleaning during exercise session. Flowsheet Row Cardiac Rehab from 11/24/2022 in [facility] Cardiac and Pulmonary Rehab  Date 11/24/22  Educator [facility]  Instruction Review Code 1- Verbalizes Understanding       Falls Prevention: - Provides verbal and  written material to individual with discussion of falls prevention and safety. Flowsheet Row Cardiac Rehab from 11/24/2022 in [facility] Cardiac and Pulmonary Rehab  Date 11/24/22  Educator [facility]  Instruction Review Code 1- Verbalizes Understanding       Other: -Provides group and verbal instruction on various topics (see comments)   Knowledge Questionnaire Score:  Knowledge Questionnaire Score - 11/23/22 1031       Knowledge Questionnaire Score   Pre Score 25/26             Core Components/Risk Factors/Patient Goals at Admission:  Personal Goals and Risk Factors at Admission - 11/24/22 1151       Core Components/Risk Factors/Patient Goals on Admission    Weight Management Yes    Intervention Weight Management: Develop a combined nutrition and exercise program designed to reach desired caloric intake, while maintaining appropriate intake of nutrient and fiber, sodium and fats, and appropriate energy expenditure required for the weight goal.;Weight Management: Provide education and appropriate resources to help participant work on and attain dietary goals.    Admit Weight 146 lb (66.2 kg)    Goal Weight: Short Term 146 lb (66.2 kg)    Goal Weight: Long Term 146 lb (66.2 kg)    Expected Outcomes Short Term: Continue to assess and modify interventions until short term weight is achieved;Long Term: Adherence to nutrition and physical activity/exercise program aimed toward attainment of established weight goal;Weight Maintenance: Understanding of the daily nutrition guidelines, which includes 25-35% calories from fat, 7% or less cal from saturated fats, less than '200mg'$  cholesterol, less than 1.5gm of sodium, & 5 or more servings of fruits and vegetables daily;Understanding recommendations for meals to include 15-35% energy as protein, 25-35% energy from fat, 35-60% energy from carbohydrates, less than '200mg'$  of dietary cholesterol, 20-35 gm of total fiber daily;Understanding of distribution of  calorie intake throughout the day with the consumption of 4-5 meals/snacks    Diabetes Yes    Intervention Provide education about signs/symptoms and action to take for hypo/hyperglycemia.;Provide education about proper nutrition, including hydration, and aerobic/resistive exercise prescription along with prescribed medications to achieve blood glucose in normal ranges: Fasting glucose 65-99 mg/dL    Expected Outcomes Short Term: Participant verbalizes understanding of the signs/symptoms and immediate care of hyper/hypoglycemia, proper foot care and importance of medication, aerobic/resistive exercise and nutrition plan for blood glucose control.;Long Term: Attainment of HbA1C < 7%.    Hypertension Yes    Intervention Provide education on lifestyle modifcations including regular physical activity/exercise, weight management, moderate sodium restriction and increased consumption of fresh fruit, vegetables, and low fat dairy, alcohol moderation, and smoking cessation.;Monitor prescription use compliance.    Expected Outcomes Short Term: Continued assessment and intervention until BP is < 140/51m HG in hypertensive participants. < 130/819mHG in hypertensive participants with diabetes, heart failure or chronic kidney disease.;Long Term: Maintenance of blood pressure at goal levels.    Lipids Yes    Intervention Provide education and support for participant on nutrition & aerobic/resistive exercise along with  prescribed medications to achieve LDL '70mg'$ , HDL >'40mg'$ .    Expected Outcomes Short Term: Participant states understanding of desired cholesterol values and is compliant with medications prescribed. Participant is following exercise prescription and nutrition guidelines.;Long Term: Cholesterol controlled with medications as prescribed, with individualized exercise RX and with personalized nutrition plan. Value goals: LDL < '70mg'$ , HDL > 40 mg.             Education:Diabetes - Individual verbal and  written instruction to review signs/symptoms of diabetes, desired ranges of glucose level fasting, after meals and with exercise. Acknowledge that pre and post exercise glucose checks will be done for 3 sessions at entry of program. Flowsheet Row Cardiac Rehab from 11/24/2022 in [facility]  Date 11/24/22  Educator [facility]  Instruction Review Code 1- Verbalizes Understanding       Core Components/Risk Factors/Patient Goals Review:    Core Components/Risk Factors/Patient Goals at Discharge (Final Review):    ITP Comments:  ITP Comments     Row Name 11/23/22 1026 11/24/22 1143         ITP Comments Initial phone call completed. Diagnosis can be found in [facility] 9/11. EP Orientation scheduled for Wednesday 12/20 at 1:30. Completed 6MWT and gym orientation. Initial ITP created and sent for review to Dr. Emily Filbert, Medical Director.               Comments: initial ITP

## 2022-11-24 NOTE — Patient Instructions (Signed)
Patient Instructions  Patient Details  Name: Tammie Smith MRN: [id number] Date of Birth: 01/26/1956 Referring Provider:  Nelva Bush, MD  Below are your personal goals for exercise, nutrition, and risk factors. Our goal is to help you stay on track towards obtaining and maintaining these goals. We will be discussing your progress on these goals with you throughout the program.  Initial Exercise Prescription:  Initial Exercise Prescription - 11/24/22 1100       Date of Initial Exercise RX and Referring Provider   Date 11/24/22    Referring Provider End      Oxygen   Maintain Oxygen Saturation 88% or higher      Treadmill   MPH 3    Grade 1    Minutes 15    METs 3.71      Recumbant Bike   Level 2    RPM 50    Minutes 15    METs 3.35      Arm Ergometer   Level 1    RPM 30    Minutes 15    METs 3.35      REL-XR   Level 2    Speed 50    Minutes 15    METs 3.35      Track   Laps 42    Minutes 15    METs 3.25      Prescription Details   Frequency (times per week) 3    Duration Progress to 30 minutes of continuous aerobic without signs/symptoms of physical distress      Intensity   THRR 40-80% of Max Heartrate 112-140    Ratings of Perceived Exertion 11-13    Perceived Dyspnea 0-4      Progression   Progression Continue to progress workloads to maintain intensity without signs/symptoms of physical distress.      Resistance Training   Training Prescription Yes    Weight 3    Reps 10-15             Exercise Goals: Frequency: Be able to perform aerobic exercise two to three times per week in program working toward 2-5 days per week of home exercise.  Intensity: Work with a perceived exertion of 11 (fairly light) - 15 (hard) while following your exercise prescription.  We will make changes to your prescription with you as you progress through the program.   Duration: Be able to do 30 to 45 minutes of continuous aerobic exercise in addition to a  5 minute warm-up and a 5 minute cool-down routine.   Nutrition Goals: Your personal nutrition goals will be established when you do your nutrition analysis with the dietician.  The following are general nutrition guidelines to follow: Cholesterol < '200mg'$ /day Sodium < '1500mg'$ /day Fiber: Women over 50 yrs - 21 grams per day  Personal Goals:  Personal Goals and Risk Factors at Admission - 11/24/22 1151       Core Components/Risk Factors/Patient Goals on Admission    Weight Management Yes    Intervention Weight Management: Develop a combined nutrition and exercise program designed to reach desired caloric intake, while maintaining appropriate intake of nutrient and fiber, sodium and fats, and appropriate energy expenditure required for the weight goal.;Weight Management: Provide education and appropriate resources to help participant work on and attain dietary goals.    Admit Weight 146 lb (66.2 kg)    Goal Weight: Short Term 146 lb (66.2 kg)    Goal Weight: Long Term 146 lb (66.2 kg)  Expected Outcomes Short Term: Continue to assess and modify interventions until short term weight is achieved;Long Term: Adherence to nutrition and physical activity/exercise program aimed toward attainment of established weight goal;Weight Maintenance: Understanding of the daily nutrition guidelines, which includes 25-35% calories from fat, 7% or less cal from saturated fats, less than '200mg'$  cholesterol, less than 1.5gm of sodium, & 5 or more servings of fruits and vegetables daily;Understanding recommendations for meals to include 15-35% energy as protein, 25-35% energy from fat, 35-60% energy from carbohydrates, less than '200mg'$  of dietary cholesterol, 20-35 gm of total fiber daily;Understanding of distribution of calorie intake throughout the day with the consumption of 4-5 meals/snacks    Diabetes Yes    Intervention Provide education about signs/symptoms and action to take for hypo/hyperglycemia.;Provide  education about proper nutrition, including hydration, and aerobic/resistive exercise prescription along with prescribed medications to achieve blood glucose in normal ranges: Fasting glucose 65-99 mg/dL    Expected Outcomes Short Term: Participant verbalizes understanding of the signs/symptoms and immediate care of hyper/hypoglycemia, proper foot care and importance of medication, aerobic/resistive exercise and nutrition plan for blood glucose control.;Long Term: Attainment of HbA1C < 7%.    Hypertension Yes    Intervention Provide education on lifestyle modifcations including regular physical activity/exercise, weight management, moderate sodium restriction and increased consumption of fresh fruit, vegetables, and low fat dairy, alcohol moderation, and smoking cessation.;Monitor prescription use compliance.    Expected Outcomes Short Term: Continued assessment and intervention until BP is < 140/51m HG in hypertensive participants. < 130/872mHG in hypertensive participants with diabetes, heart failure or chronic kidney disease.;Long Term: Maintenance of blood pressure at goal levels.    Lipids Yes    Intervention Provide education and support for participant on nutrition & aerobic/resistive exercise along with prescribed medications to achieve LDL '70mg'$ , HDL >'40mg'$ .    Expected Outcomes Short Term: Participant states understanding of desired cholesterol values and is compliant with medications prescribed. Participant is following exercise prescription and nutrition guidelines.;Long Term: Cholesterol controlled with medications as prescribed, with individualized exercise RX and with personalized nutrition plan. Value goals: LDL < '70mg'$ , HDL > 40 mg.             Tobacco Use Initial Evaluation: Social History   Tobacco Use  Smoking Status Never  Smokeless Tobacco Never    Exercise Goals and Review:  Exercise Goals     Row Name 11/24/22 1149             Exercise Goals   Increase  Physical Activity Yes       Intervention Provide advice, education, support and counseling about physical activity/exercise needs.;Develop an individualized exercise prescription for aerobic and resistive training based on initial evaluation findings, risk stratification, comorbidities and participant's personal goals.       Expected Outcomes Short Term: Attend rehab on a regular basis to increase amount of physical activity.;Long Term: Add in home exercise to make exercise part of routine and to increase amount of physical activity.;Long Term: Exercising regularly at least 3-5 days a week.       Increase Strength and Stamina Yes       Intervention Provide advice, education, support and counseling about physical activity/exercise needs.;Develop an individualized exercise prescription for aerobic and resistive training based on initial evaluation findings, risk stratification, comorbidities and participant's personal goals.       Expected Outcomes Short Term: Increase workloads from initial exercise prescription for resistance, speed, and METs.;Short Term: Perform resistance training exercises routinely during rehab and  add in resistance training at home;Long Term: Improve cardiorespiratory fitness, muscular endurance and strength as measured by increased METs and functional capacity (6MWT)       Able to understand and use rate of perceived exertion (RPE) scale Yes       Intervention Provide education and explanation on how to use RPE scale       Expected Outcomes Short Term: Able to use RPE daily in rehab to express subjective intensity level;Long Term:  Able to use RPE to guide intensity level when exercising independently       Able to understand and use Dyspnea scale Yes       Intervention Provide education and explanation on how to use Dyspnea scale       Expected Outcomes Long Term: Able to use Dyspnea scale to guide intensity level when exercising independently;Short Term: Able to use Dyspnea scale  daily in rehab to express subjective sense of shortness of breath during exertion       Knowledge and understanding of Target Heart Rate Range (THRR) Yes       Intervention Provide education and explanation of THRR including how the numbers were predicted and where they are located for reference       Expected Outcomes Short Term: Able to state/look up THRR;Long Term: Able to use THRR to govern intensity when exercising independently;Short Term: Able to use daily as guideline for intensity in rehab       Able to check pulse independently Yes       Intervention Provide education and demonstration on how to check pulse in carotid and radial arteries.;Review the importance of being able to check your own pulse for safety during independent exercise       Expected Outcomes Short Term: Able to explain why pulse checking is important during independent exercise       Understanding of Exercise Prescription Yes       Intervention Provide education, explanation, and written materials on patient's individual exercise prescription       Expected Outcomes Short Term: Able to explain program exercise prescription;Long Term: Able to explain home exercise prescription to exercise independently                Copy of goals given to participant.

## 2022-11-25 ENCOUNTER — Encounter: Payer: Managed Care, Other (non HMO) | Admitting: *Deleted

## 2022-11-25 DIAGNOSIS — I252 Old myocardial infarction: Secondary | ICD-10-CM | POA: Diagnosis not present

## 2022-11-25 DIAGNOSIS — Z955 Presence of coronary angioplasty implant and graft: Secondary | ICD-10-CM

## 2022-11-25 DIAGNOSIS — I214 Non-ST elevation (NSTEMI) myocardial infarction: Secondary | ICD-10-CM

## 2022-11-25 LAB — GLUCOSE, CAPILLARY
Glucose-Capillary: 142 mg/dL — ABNORMAL HIGH (ref 70–99)
Glucose-Capillary: 117 mg/dL — ABNORMAL HIGH (ref 70–99)

## 2022-11-25 NOTE — Progress Notes (Signed)
Daily Session Note  Patient Details  Name: Tammie Smith MRN: [id number] Date of Birth: 10/01/1956 Referring Provider:   Flowsheet Row Cardiac Rehab from 11/24/2022 in [facility] Card[facility]  Referring Provider End       Encounter Date: 11/25/2022  Check In:  Session Check In - 11/25/22 1557       Check-In   Supervising physician immediately available to respond to emergencies See telemetry face sheet for immediately available ER MD    Location [facility]-Cardiac & Pulmonary Rehab    Staff Present Renita Papa, RN BSN;Joseph Tessie Fass, [place]    Virtual Visit No    Medication changes reported     No    Fall or balance concerns reported    No    Warm-up and Cool-down Performed on first and last piece of equipment    Resistance Training Performed Yes    VAD Patient? No    PAD/SET Patient? No      Pain Assessment   Currently in Pain? No/denies                Social History   Tobacco Use  Smoking Status Never  Smokeless Tobacco Never    Goals Met:  Independence with exercise equipment Exercise tolerated well No report of concerns or symptoms today Strength training completed today  Goals Unmet:  Not Applicable  Comments: First full day of exercise!  Patient was oriented to gym and equipment including functions, settings, policies, and procedures.  Patient's individual exercise prescription and treatment plan were reviewed.  All starting workloads were established based on the results of the 6 minute walk test done at initial orientation visit.  The plan for exercise progression was also introduced and progression will be customized based on patient's performance and goals.    Dr. Emily Filbert is Medical Director for [facility].  Dr. Ottie Glazier is Medical Director for [facility] Pulmonary Rehabilitation.

## 2022-12-01 ENCOUNTER — Encounter: Payer: Managed Care, Other (non HMO) | Admitting: *Deleted

## 2022-12-01 ENCOUNTER — Encounter: Payer: Self-pay | Admitting: *Deleted

## 2022-12-01 DIAGNOSIS — I252 Old myocardial infarction: Secondary | ICD-10-CM | POA: Diagnosis not present

## 2022-12-01 DIAGNOSIS — I214 Non-ST elevation (NSTEMI) myocardial infarction: Secondary | ICD-10-CM

## 2022-12-01 DIAGNOSIS — Z955 Presence of coronary angioplasty implant and graft: Secondary | ICD-10-CM

## 2022-12-01 NOTE — Progress Notes (Signed)
Cardiac Individual Treatment Plan  Patient Details  Name: Tammie Smith MRN: [id number] Date of Birth: [birth year] Referring Provider:   Flowsheet Row Cardiac Rehab from 11/24/2022 in [facility] Cardiac and Pulmonary Rehab  Referring Provider End       Initial Encounter Date:  Flowsheet Row Cardiac Rehab from 11/24/2022 in [facility] Cardiac and Pulmonary Rehab  Date 11/24/22       Visit Diagnosis: NSTEMI (non-ST elevated myocardial infarction) [facility])  Status post coronary artery stent placement  Patient's Home Medications on Admission:  Current Outpatient Medications:    aspirin EC 81 MG tablet, Take 1 tablet (81 mg total) by mouth daily. Swallow whole., Disp: 90 tablet, Rfl: 3   atorvastatin (LIPITOR) 80 MG tablet, Take 1 tablet (80 mg total) by mouth daily., Disp: 90 tablet, Rfl: 3   DULoxetine (CYMBALTA) 30 MG capsule, Take 30 mg by mouth in the morning., Disp: , Rfl:    isosorbide mononitrate (IMDUR) 30 MG 24 hr tablet, Take 0.5 tablets (15 mg total) by mouth daily., Disp: 45 tablet, Rfl: 3   metFORMIN (GLUCOPHAGE-XR) 500 MG 24 hr tablet, Take 1 tablet (500 mg total) by mouth daily with supper., Disp: , Rfl:    nitroGLYCERIN (NITROSTAT) 0.4 MG SL tablet, Place 1 tablet (0.4 mg total) under the tongue every 5 (five) minutes as needed for chest pain., Disp: 90 tablet, Rfl: 3   propranolol (INDERAL) 40 MG tablet, Take 40 mg by mouth 2 (two) times daily., Disp: , Rfl:    RABEprazole (ACIPHEX) 20 MG tablet, Take 20 mg by mouth at bedtime., Disp: , Rfl:    ticagrelor (BRILINTA) 90 MG TABS tablet, Take 1 tablet (90 mg total) by mouth 2 (two) times daily., Disp: 180 tablet, Rfl: 3  Past Medical History: Past Medical History:  Diagnosis Date   Borderline glaucoma    Cancer ([place])    skin cancer basal cell   Coronary artery disease 08/16/2022   NSTEMI s/p PCI to OM1   Degenerative disc disease, lumbar    Gestational diabetes    Hyperlipemia    Right-sided carotid artery disease (HCC)     echo done    Tobacco Use: Social History   Tobacco Use  Smoking Status Never  Smokeless Tobacco Never    Labs: Review Flowsheet       Latest Ref Rng & Units 08/16/2022 08/18/2022 10/21/2022  Labs for ITP Cardiac and Pulmonary Rehab  Cholestrol 100 - 199 mg/dL - 248  181   LDL (calc) 0 - 99 mg/dL - 167  113   HDL-C >39 mg/dL - 37  41   Trlycerides 0 - 149 mg/dL - 219  155   Hemoglobin A1c 4.8 - 5.6 % 6.9  - -     Exercise Target Goals: Exercise Program Goal: Individual exercise prescription set using results from initial 6 min walk test and THRR while considering  patient's activity barriers and safety.   Exercise Prescription Goal: Initial exercise prescription builds to 30-45 minutes a day of aerobic activity, 2-3 days per week.  Home exercise guidelines will be given to patient during program as part of exercise prescription that the participant will acknowledge.   Education: Aerobic Exercise: - Group verbal and visual presentation on the components of exercise prescription. Introduces F.I.T.T principle from ACSM for exercise prescriptions.  Reviews F.I.T.T. principles of aerobic exercise including progression. Written material given at graduation.   Education: Resistance Exercise: - Group verbal and visual presentation on the components of exercise prescription. Introduces  F.I.T.T principle from ACSM for exercise prescriptions  Reviews F.I.T.T. principles of resistance exercise including progression. Written material given at graduation.    Education: Exercise & Equipment Safety: - Individual verbal instruction and demonstration of equipment use and safety with use of the equipment. Flowsheet Row Cardiac Rehab from 11/24/2022 in [facility] Cardiac and Pulmonary Rehab  Date 11/24/22  Educator [facility]  Instruction Review Code 1- Verbalizes Understanding       Education: Exercise Physiology & General Exercise Guidelines: - Group verbal and written instruction with models to  review the exercise physiology of the cardiovascular system and associated critical values. Provides general exercise guidelines with specific guidelines to those with heart or lung disease.    Education: Flexibility, Balance, Mind/Body Relaxation: - Group verbal and visual presentation with interactive activity on the components of exercise prescription. Introduces F.I.T.T principle from ACSM for exercise prescriptions. Reviews F.I.T.T. principles of flexibility and balance exercise training including progression. Also discusses the mind body connection.  Reviews various relaxation techniques to help reduce and manage stress (i.e. Deep breathing, progressive muscle relaxation, and visualization). Balance handout provided to take home. Written material given at graduation.   Activity Barriers & Risk Stratification:  Activity Barriers & Cardiac Risk Stratification - 11/24/22 1145       Activity Barriers & Cardiac Risk Stratification   Activity Barriers Joint Problems;Back Problems    Cardiac Risk Stratification High             6 Minute Walk:  6 Minute Walk     Row Name 11/24/22 1143         6 Minute Walk   Phase Initial     Distance 1370 feet     Walk Time 6 minutes     # of Rest Breaks 0     MPH 2.59     METS 3.35     RPE 7     Perceived Dyspnea  0     VO2 Peak 11.72     Symptoms No     Resting HR 84 bpm     Resting BP 112/62     Resting Oxygen Saturation  95 %     Exercise Oxygen Saturation  during 6 min walk 97 %     Max Ex. HR 105 bpm     Max Ex. BP 136/70     2 Minute Post BP 106/62              Oxygen Initial Assessment:   Oxygen Re-Evaluation:   Oxygen Discharge (Final Oxygen Re-Evaluation):   Initial Exercise Prescription:  Initial Exercise Prescription - 11/24/22 1100       Date of Initial Exercise RX and Referring Provider   Date 11/24/22    Referring Provider End      Oxygen   Maintain Oxygen Saturation 88% or higher      Treadmill    MPH 3    Grade 1    Minutes 15    METs 3.71      Recumbant Bike   Level 2    RPM 50    Minutes 15    METs 3.35      Arm Ergometer   Level 1    RPM 30    Minutes 15    METs 3.35      REL-XR   Level 2    Speed 50    Minutes 15    METs 3.35      Track   Laps 42  Minutes 15    METs 3.25      Prescription Details   Frequency (times per week) 3    Duration Progress to 30 minutes of continuous aerobic without signs/symptoms of physical distress      Intensity   THRR 40-80% of Max Heartrate 112-140    Ratings of Perceived Exertion 11-13    Perceived Dyspnea 0-4      Progression   Progression Continue to progress workloads to maintain intensity without signs/symptoms of physical distress.      Resistance Training   Training Prescription Yes    Weight 3    Reps 10-15             Perform Capillary Blood Glucose checks as needed.  Exercise Prescription Changes:   Exercise Prescription Changes     Row Name 11/24/22 1100 11/30/22 1300           Response to Exercise   Blood Pressure (Admit) 112/62 102/60      Blood Pressure (Exercise) 136/70 142/62      Blood Pressure (Exit) 106/62 112/60      Heart Rate (Admit) 84 bpm 82 bpm      Heart Rate (Exercise) 105 bpm 126 bpm      Heart Rate (Exit) 85 bpm 102 bpm      Oxygen Saturation (Admit) 95 % --      Oxygen Saturation (Exercise) 97 % --      Oxygen Saturation (Exit) 97 % --      Rating of Perceived Exertion (Exercise) 7 12      Perceived Dyspnea (Exercise) 0 --      Symptoms none none      Comments 6 MWT results First full day of exercise      Duration -- Progress to 30 minutes of  aerobic without signs/symptoms of physical distress      Intensity -- THRR unchanged        Progression   Progression -- Continue to progress workloads to maintain intensity without signs/symptoms of physical distress.      Average METs -- 3.56        Resistance Training   Training Prescription -- Yes      Weight --  3      Reps -- 10-15        Interval Training   Interval Training -- No        Treadmill   MPH -- 3      Grade -- 1      Minutes -- 15      METs -- 3.71        REL-XR   Level -- 2      Minutes -- 15      METs -- 3.4        Oxygen   Maintain Oxygen Saturation -- 88% or higher               Exercise Comments:   Exercise Comments     Row Name 11/25/22 1558           Exercise Comments First full day of exercise!  Patient was oriented to gym and equipment including functions, settings, policies, and procedures.  Patient's individual exercise prescription and treatment plan were reviewed.  All starting workloads were established based on the results of the 6 minute walk test done at initial orientation visit.  The plan for exercise progression was also introduced and progression will be customized based on patient's performance and goals.  Exercise Goals and Review:   Exercise Goals     Row Name 11/24/22 1149             Exercise Goals   Increase Physical Activity Yes       Intervention Provide advice, education, support and counseling about physical activity/exercise needs.;Develop an individualized exercise prescription for aerobic and resistive training based on initial evaluation findings, risk stratification, comorbidities and participant's personal goals.       Expected Outcomes Short Term: Attend rehab on a regular basis to increase amount of physical activity.;Long Term: Add in home exercise to make exercise part of routine and to increase amount of physical activity.;Long Term: Exercising regularly at least 3-5 days a week.       Increase Strength and Stamina Yes       Intervention Provide advice, education, support and counseling about physical activity/exercise needs.;Develop an individualized exercise prescription for aerobic and resistive training based on initial evaluation findings, risk stratification, comorbidities and participant's  personal goals.       Expected Outcomes Short Term: Increase workloads from initial exercise prescription for resistance, speed, and METs.;Short Term: Perform resistance training exercises routinely during rehab and add in resistance training at home;Long Term: Improve cardiorespiratory fitness, muscular endurance and strength as measured by increased METs and functional capacity (6MWT)       Able to understand and use rate of perceived exertion (RPE) scale Yes       Intervention Provide education and explanation on how to use RPE scale       Expected Outcomes Short Term: Able to use RPE daily in rehab to express subjective intensity level;Long Term:  Able to use RPE to guide intensity level when exercising independently       Able to understand and use Dyspnea scale Yes       Intervention Provide education and explanation on how to use Dyspnea scale       Expected Outcomes Long Term: Able to use Dyspnea scale to guide intensity level when exercising independently;Short Term: Able to use Dyspnea scale daily in rehab to express subjective sense of shortness of breath during exertion       Knowledge and understanding of Target Heart Rate Range (THRR) Yes       Intervention Provide education and explanation of THRR including how the numbers were predicted and where they are located for reference       Expected Outcomes Short Term: Able to state/look up THRR;Long Term: Able to use THRR to govern intensity when exercising independently;Short Term: Able to use daily as guideline for intensity in rehab       Able to check pulse independently Yes       Intervention Provide education and demonstration on how to check pulse in carotid and radial arteries.;Review the importance of being able to check your own pulse for safety during independent exercise       Expected Outcomes Short Term: Able to explain why pulse checking is important during independent exercise       Understanding of Exercise Prescription Yes        Intervention Provide education, explanation, and written materials on patient's individual exercise prescription       Expected Outcomes Short Term: Able to explain program exercise prescription;Long Term: Able to explain home exercise prescription to exercise independently                Exercise Goals Re-Evaluation :  Exercise Goals Re-Evaluation     Row  Name 11/25/22 1600 11/30/22 1343           Exercise Goal Re-Evaluation   Exercise Goals Review Increase Physical Activity;Able to understand and use rate of perceived exertion (RPE) scale;Knowledge and understanding of Target Heart Rate Range (THRR);Understanding of Exercise Prescription;Increase Strength and Stamina;Able to check pulse independently Increase Physical Activity;Understanding of Exercise Prescription;Increase Strength and Stamina      Comments Reviewed RPE scale, THR and program prescription with pt today.  Pt voiced understanding and was given a copy of goals to take home. Tammie Smith is off to a good start in rehab. She did well during her first session in rehab. She has an average MET level of 3.56 METs on the treadmill and XR. She did well with both machines at her initial exercise prescription. We will continue to monitor her progress in the program.      Expected Outcomes Short: Use RPE daily to regulate intensity.  Long: Follow program prescription in THR. Short: Continue to follow current exercise prescription. Long: Continue to improve strength and stamina.               Discharge Exercise Prescription (Final Exercise Prescription Changes):  Exercise Prescription Changes - 11/30/22 1300       Response to Exercise   Blood Pressure (Admit) 102/60    Blood Pressure (Exercise) 142/62    Blood Pressure (Exit) 112/60    Heart Rate (Admit) 82 bpm    Heart Rate (Exercise) 126 bpm    Heart Rate (Exit) 102 bpm    Rating of Perceived Exertion (Exercise) 12    Symptoms none    Comments First full day of exercise     Duration Progress to 30 minutes of  aerobic without signs/symptoms of physical distress    Intensity THRR unchanged      Progression   Progression Continue to progress workloads to maintain intensity without signs/symptoms of physical distress.    Average METs 3.56      Resistance Training   Training Prescription Yes    Weight 3    Reps 10-15      Interval Training   Interval Training No      Treadmill   MPH 3    Grade 1    Minutes 15    METs 3.71      REL-XR   Level 2    Minutes 15    METs 3.4      Oxygen   Maintain Oxygen Saturation 88% or higher             Nutrition:  Target Goals: Understanding of nutrition guidelines, daily intake of sodium <1576m, cholesterol <2080m calories 30% from fat and 7% or less from saturated fats, daily to have 5 or more servings of fruits and vegetables.  Education: All About Nutrition: -Group instruction provided by verbal, written material, interactive activities, discussions, models, and posters to present general guidelines for heart healthy nutrition including fat, fiber, MyPlate, the role of sodium in heart healthy nutrition, utilization of the nutrition label, and utilization of this knowledge for meal planning. Follow up email sent as well. Written material given at graduation.   Biometrics:  Pre Biometrics - 11/24/22 1150       Pre Biometrics   Height 5' 3.25" (1.607 m)    Weight 146 lb (66.2 kg)    Waist Circumference 35 inches    Hip Circumference 40 inches    Waist to Hip Ratio 0.88 %    BMI (Calculated) 25.64  Single Leg Stand 3.44 seconds              Nutrition Therapy Plan and Nutrition Goals:  Nutrition Therapy & Goals - 11/24/22 1114       Nutrition Therapy   Diet Heart healthy, low Na, T2DM    Drug/Food Interactions Statins/Certain Fruits    Protein (specify units) 55-65g    Fiber 25 grams    Whole Grain Foods 3 servings    Saturated Fats 12 max. grams    Fruits and Vegetables 8  servings/day    Sodium 2 grams      Personal Nutrition Goals   Nutrition Goal ST: review handouts LT: follow MyPlate guidelines, maintain A1C <7    Comments 66 y.o. F admitted to cardiac rehab s/p NSTEMI. PMHx includes HLD, DJD, T2DM, CAD, skin cancer (basal cell). Relevant medications reviewed. PYP Score: 65. Vegetables & Fruits 8/12. Breads, Grains & Cereals 8/12. Red & Processed Meat 10/12. Poultry 2/2. Fish & Shellfish 0/4. Beans, Nuts & Seeds 0/4. Milk & Dairy Foods 4/6. Toppings, Oils, Seasonings & Salt 16/20. Sweets, Snacks & Restaurant Food 7/14. Beverages 10/10. Keigan reports that she is enjoying more bland food and she does not have much of an appetite. She reports getting back her normal eating pattern as her husband did not enjoy many heart healthy foods. B: 2 eggs and spinach frittata with rye toast and small amount of butter, hot oatbran cereal with blueberries.  L: example salad with baked chicken or [place] sandwich (arnolds whole grain bread with nuts in it). She likes to treat herself to [employer]era as well. D: sometimes does not eat. bowl of chicken noodle soup and bagel last night. She usually has sometime light. She reports her bigger meals are breakfast and lunch. Drinks: water. She uses olive oil most of the time. Loann reports cooking with some salt, but does not add any more. Discussed heart healthy eating and T2DM MNT.      Intervention Plan   Intervention Prescribe, educate and counsel regarding individualized specific dietary modifications aiming towards targeted core components such as weight, hypertension, lipid management, diabetes, heart failure and other comorbidities.;Nutrition handout(s) given to patient.    Expected Outcomes Short Term Goal: Understand basic principles of dietary content, such as calories, fat, sodium, cholesterol and nutrients.;Short Term Goal: A plan has been developed with personal nutrition goals set during dietitian appointment.;Long Term Goal: Adherence  to prescribed nutrition plan.             Nutrition Assessments:  MEDIFICTS Score Key: ?70 Need to make dietary changes  40-70 Heart Healthy Diet ? 40 Therapeutic Level Cholesterol Diet  Flowsheet Row Cardiac Rehab from 11/23/2022 in [facility] Cardiac and Pulmonary Rehab  Picture Your Plate Total Score on Admission 65      Picture Your Plate Scores: <21 Unhealthy dietary pattern with much room for improvement. 41-50 Dietary pattern unlikely to meet recommendations for good health and room for improvement. 51-60 More healthful dietary pattern, with some room for improvement.  >60 Healthy dietary pattern, although there may be some specific behaviors that could be improved.    Nutrition Goals Re-Evaluation:   Nutrition Goals Discharge (Final Nutrition Goals Re-Evaluation):   Psychosocial: Target Goals: Acknowledge presence or absence of significant depression and/or stress, maximize coping skills, provide positive support system. Participant is able to verbalize types and ability to use techniques and skills needed for reducing stress and depression.   Education: Stress, Anxiety, and Depression - Group verbal and visual  presentation to define topics covered.  Reviews how body is impacted by stress, anxiety, and depression.  Also discusses healthy ways to reduce stress and to treat/manage anxiety and depression.  Written material given at graduation.   Education: Sleep Hygiene -Provides group verbal and written instruction about how sleep can affect your health.  Define sleep hygiene, discuss sleep cycles and impact of sleep habits. Review good sleep hygiene tips.    Initial Review & Psychosocial Screening:  Initial Psych Review & Screening - 11/23/22 1008       Initial Review   Current issues with Current Stress Concerns;Current Depression    Source of Stress Concerns Family    Comments husband recently passed after a 2 year battle with esophagus cancer      Family  Dynamics   Good Support System? Yes   three children     Barriers   Psychosocial barriers to participate in program There are no identifiable barriers or psychosocial needs.;The patient should benefit from training in stress management and relaxation.      Screening Interventions   Interventions Encouraged to exercise;Provide feedback about the scores to participant;To provide support and resources with identified psychosocial needs    Expected Outcomes Short Term goal: Utilizing psychosocial counselor, staff and physician to assist with identification of specific Stressors or current issues interfering with healing process. Setting desired goal for each stressor or current issue identified.;Long Term Goal: Stressors or current issues are controlled or eliminated.;Short Term goal: Identification and review with participant of any Quality of Life or Depression concerns found by scoring the questionnaire.;Long Term goal: The participant improves quality of Life and PHQ9 Scores as seen by post scores and/or verbalization of changes             Quality of Life Scores:   Quality of Life - 11/23/22 1031       Quality of Life   Select Quality of Life      Quality of Life Scores   Health/Function Pre 27.17 %    Socioeconomic Pre 23.94 %    Psych/Spiritual Pre 27.57 %    Family Pre 27 %    GLOBAL Pre 26.49 %            Scores of 19 and below usually indicate a poorer quality of life in these areas.  A difference of  2-3 points is a clinically meaningful difference.  A difference of 2-3 points in the total score of the Quality of Life Index has been associated with significant improvement in overall quality of life, self-image, physical symptoms, and general health in studies assessing change in quality of life.  PHQ-9: Review Flowsheet       11/24/2022  Depression screen PHQ 2/9  Decreased Interest 3  Down, Depressed, Hopeless 2  PHQ - 2 Score 5  Altered sleeping 2  Tired,  decreased energy 3  Change in appetite 3  Feeling bad or failure about yourself  0  Trouble concentrating 1  Moving slowly or fidgety/restless 0  Suicidal thoughts 0  PHQ-9 Score 14  Difficult doing work/chores Very difficult   Interpretation of Total Score  Total Score Depression Severity:  1-4 = Minimal depression, 5-9 = Mild depression, 10-14 = Moderate depression, 15-19 = Moderately severe depression, 20-27 = Severe depression   Psychosocial Evaluation and Intervention:  Psychosocial Evaluation - 11/23/22 1026       Psychosocial Evaluation & Interventions   Interventions Encouraged to exercise with the program and follow exercise prescription;Stress  management education;Relaxation education    Comments Halana is coming to cardiac rehab after stents that were done at two separate procedures. Her first stent was in September and was the week after her husband passed away after battling 2 years with esophagus cancer. She was his main caretaker and mentioned her whole schedule was around his needs. So now she is trying to find a "new normal." She is still working and has her 3 children as her main support system. Her heart trouble started right after the passing of her husband so she is tired and ready for a new routine where she can feel better. Her sleep has suffered for a while now, where she is up every few hours. She thinks that might have been because she had to be up giving him pain medicine and such, but now her body just can't rest longer than about 4 hours. She is hoping this program will help boost her stamina and increase her knowledge on heart healthy living.    Expected Outcomes Short: attend cardiac rehab for education and exercise. Long: develop and maintain positive self care habits.    Continue Psychosocial Services  Follow up required by staff             Psychosocial Re-Evaluation:   Psychosocial Discharge (Final Psychosocial Re-Evaluation):   Vocational  Rehabilitation: Provide vocational rehab assistance to qualifying candidates.   Vocational Rehab Evaluation & Intervention:  Vocational Rehab - 11/23/22 1008       Initial Vocational Rehab Evaluation & Intervention   Assessment shows need for Vocational Rehabilitation No             Education: Education Goals: Education classes will be provided on a variety of topics geared toward better understanding of heart health and risk factor modification. Participant will state understanding/return demonstration of topics presented as noted by education test scores.  Learning Barriers/Preferences:  Learning Barriers/Preferences - 11/23/22 1008       Learning Barriers/Preferences   Learning Barriers None    Learning Preferences None             General Cardiac Education Topics:  AED/CPR: - Group verbal and written instruction with the use of models to demonstrate the basic use of the AED with the basic ABC's of resuscitation.   Anatomy and Cardiac Procedures: - Group verbal and visual presentation and models provide information about basic cardiac anatomy and function. Reviews the testing methods done to diagnose heart disease and the outcomes of the test results. Describes the treatment choices: Medical Management, Angioplasty, or Coronary Bypass Surgery for treating various heart conditions including Myocardial Infarction, Angina, Valve Disease, and Cardiac Arrhythmias.  Written material given at graduation. Flowsheet Row Cardiac Rehab from 11/24/2022 in [facility] Cardiac and Pulmonary Rehab  Education need identified 11/24/22       Medication Safety: - Group verbal and visual instruction to review commonly prescribed medications for heart and lung disease. Reviews the medication, class of the drug, and side effects. Includes the steps to properly store meds and maintain the prescription regimen.  Written material given at graduation.   Intimacy: - Group verbal instruction  through game format to discuss how heart and lung disease can affect sexual intimacy. Written material given at graduation..   Know Your Numbers and Heart Failure: - Group verbal and visual instruction to discuss disease risk factors for cardiac and pulmonary disease and treatment options.  Reviews associated critical values for Overweight/Obesity, Hypertension, Cholesterol, and Diabetes.  Discusses basics of heart  failure: signs/symptoms and treatments.  Introduces Heart Failure Zone chart for action plan for heart failure.  Written material given at graduation.   Infection Prevention: - Provides verbal and written material to individual with discussion of infection control including proper hand washing and proper equipment cleaning during exercise session. Flowsheet Row Cardiac Rehab from 11/24/2022 in [facility] Cardiac and Pulmonary Rehab  Date 11/24/22  Educator [facility]  Instruction Review Code 1- Verbalizes Understanding       Falls Prevention: - Provides verbal and written material to individual with discussion of falls prevention and safety. Flowsheet Row Cardiac Rehab from 11/24/2022 in [facility] Cardiac and Pulmonary Rehab  Date 11/24/22  Educator [facility]  Instruction Review Code 1- Verbalizes Understanding       Other: -Provides group and verbal instruction on various topics (see comments)   Knowledge Questionnaire Score:  Knowledge Questionnaire Score - 11/23/22 1031       Knowledge Questionnaire Score   Pre Score 25/26             Core Components/Risk Factors/Patient Goals at Admission:  Personal Goals and Risk Factors at Admission - 11/24/22 1151       Core Components/Risk Factors/Patient Goals on Admission    Weight Management Yes    Intervention Weight Management: Develop a combined nutrition and exercise program designed to reach desired caloric intake, while maintaining appropriate intake of nutrient and fiber, sodium and fats, and appropriate energy expenditure  required for the weight goal.;Weight Management: Provide education and appropriate resources to help participant work on and attain dietary goals.    Admit Weight 146 lb (66.2 kg)    Goal Weight: Short Term 146 lb (66.2 kg)    Goal Weight: Long Term 146 lb (66.2 kg)    Expected Outcomes Short Term: Continue to assess and modify interventions until short term weight is achieved;Long Term: Adherence to nutrition and physical activity/exercise program aimed toward attainment of established weight goal;Weight Maintenance: Understanding of the daily nutrition guidelines, which includes 25-35% calories from fat, 7% or less cal from saturated fats, less than 260m cholesterol, less than 1.5gm of sodium, & 5 or more servings of fruits and vegetables daily;Understanding recommendations for meals to include 15-35% energy as protein, 25-35% energy from fat, 35-60% energy from carbohydrates, less than 204mof dietary cholesterol, 20-35 gm of total fiber daily;Understanding of distribution of calorie intake throughout the day with the consumption of 4-5 meals/snacks    Diabetes Yes    Intervention Provide education about signs/symptoms and action to take for hypo/hyperglycemia.;Provide education about proper nutrition, including hydration, and aerobic/resistive exercise prescription along with prescribed medications to achieve blood glucose in normal ranges: Fasting glucose 65-99 mg/dL    Expected Outcomes Short Term: Participant verbalizes understanding of the signs/symptoms and immediate care of hyper/hypoglycemia, proper foot care and importance of medication, aerobic/resistive exercise and nutrition plan for blood glucose control.;Long Term: Attainment of HbA1C < 7%.    Hypertension Yes    Intervention Provide education on lifestyle modifcations including regular physical activity/exercise, weight management, moderate sodium restriction and increased consumption of fresh fruit, vegetables, and low fat dairy,  alcohol moderation, and smoking cessation.;Monitor prescription use compliance.    Expected Outcomes Short Term: Continued assessment and intervention until BP is < 140/9079mG in hypertensive participants. < 130/41m32m in hypertensive participants with diabetes, heart failure or chronic kidney disease.;Long Term: Maintenance of blood pressure at goal levels.    Lipids Yes    Intervention Provide education and support for participant on  nutrition & aerobic/resistive exercise along with prescribed medications to achieve LDL <53m, HDL >453m    Expected Outcomes Short Term: Participant states understanding of desired cholesterol values and is compliant with medications prescribed. Participant is following exercise prescription and nutrition guidelines.;Long Term: Cholesterol controlled with medications as prescribed, with individualized exercise RX and with personalized nutrition plan. Value goals: LDL < 7078mHDL > 40 mg.             Education:Diabetes - Individual verbal and written instruction to review signs/symptoms of diabetes, desired ranges of glucose level fasting, after meals and with exercise. Acknowledge that pre and post exercise glucose checks will be done for 3 sessions at entry of program. Flowsheet Row [facility]  Date 11/24/22  [facility] Code 1- Verbalizes Understanding       Core Components/Risk Factors/Patient Goals Review:    Core Components/Risk Factors/Patient Goals at Discharge (Final Review):    ITP Comments:  ITP Comments     Row Name 11/23/22 1026 11/24/22 1143 11/25/22 1558 12/01/22 1032     ITP Comments Initial phone call completed. [facility] EP Orientation scheduled for Wednesday 12/20 at 1:30. Completed 6MWT and gym orientation. Initial ITP created and sent for review to Dr. MarEmily Filbertedical Director. First full day of exercise!  Patient was  oriented to gym and equipment including functions, settings, policies, and procedures.  Patient's individual exercise prescription and treatment plan were reviewed.  All starting workloads were established based on the results of the 6 minute walk test done at initial orientation visit.  The plan for exercise progression was also introduced and progression will be customized based on patient's performance and goals. 30 Day review completed. Medical Director ITP review done, changes made as directed, and signed approval by Medical Director.     new to program             Comments:

## 2022-12-01 NOTE — Progress Notes (Signed)
Daily Session Note  Patient Details  Name: Tammie Smith MRN: [id number] Date of Birth: May 25, 1956 Referring Provider:   Flowsheet Row Cardiac Rehab from 11/24/2022 in [facility] Cardiac and Pulmonary Rehab  Referring Provider End       Encounter Date: 12/01/2022  Check In:  Session Check In - 12/01/22 1616       Check-In   Supervising physician immediately available to respond to emergencies See telemetry face sheet for immediately available ER MD    Location [facility]-Cardiac & Pulmonary Rehab    Staff Present Renita Papa, RN BSN;Kristen Coble, RN,BC,MSN;Megan Tamala Julian, RN, [place]    Virtual Visit No    Medication changes reported     No    Fall or balance concerns reported    No    Warm-up and Cool-down Performed on first and last piece of equipment    Resistance Training Performed Yes    VAD Patient? No    PAD/SET Patient? No      Pain Assessment   Currently in Pain? No/denies                Social History   Tobacco Use  Smoking Status Never  Smokeless Tobacco Never    Goals Met:  Independence with exercise equipment Exercise tolerated well No report of concerns or symptoms today Strength training completed today  Goals Unmet:  Not Applicable  Comments: Pt able to follow exercise prescription today without complaint.  Will continue to monitor for progression.    Dr. Emily Filbert is Medical Director for [place].  Dr. Ottie Glazier is Medical Director for [facility] Pulmonary Rehabilitation.

## 2022-12-02 ENCOUNTER — Encounter: Payer: Managed Care, Other (non HMO) | Admitting: *Deleted

## 2022-12-02 DIAGNOSIS — Z955 Presence of coronary angioplasty implant and graft: Secondary | ICD-10-CM

## 2022-12-02 DIAGNOSIS — I252 Old myocardial infarction: Secondary | ICD-10-CM | POA: Diagnosis not present

## 2022-12-02 DIAGNOSIS — I214 Non-ST elevation (NSTEMI) myocardial infarction: Secondary | ICD-10-CM

## 2022-12-02 LAB — GLUCOSE, CAPILLARY
Glucose-Capillary: 122 mg/dL — ABNORMAL HIGH (ref 70–99)
Glucose-Capillary: 97 mg/dL (ref 70–99)

## 2022-12-02 NOTE — Progress Notes (Signed)
Daily Session Note  Patient Details  Name: Tammie Smith MRN: [id number] Date of Birth: 23-Jan-1956 Referring Provider:   Flowsheet Row Cardiac Rehab from 11/24/2022 in [facility] Cardiac and Pulmonary Rehab  Referring Provider End       Encounter Date: 12/02/2022  Check In:  Session Check In - 12/02/22 1558       Check-In   Supervising physician immediately available to respond to emergencies See telemetry face sheet for immediately available ER MD    Location [facility]-Cardiac & Pulmonary Rehab    Staff Present Renita Papa, RN BSN;Joseph Tessie Fass, Ernestina Patches, RN, [place]    Virtual Visit No    Medication changes reported     No    Fall or balance concerns reported    No    Warm-up and Cool-down Performed on first and last piece of equipment    Resistance Training Performed Yes    VAD Patient? No    PAD/SET Patient? No      Pain Assessment   Currently in Pain? No/denies                Social History   Tobacco Use  Smoking Status Never  Smokeless Tobacco Never    Goals Met:  Independence with exercise equipment Exercise tolerated well No report of concerns or symptoms today Strength training completed today  Goals Unmet:  Not Applicable  Comments: Pt able to follow exercise prescription today without complaint.  Will continue to monitor for progression.    Dr. Emily Filbert is Medical Director for [place].  Dr. Ottie Glazier is Medical Director for [facility] Pulmonary Rehabilitation.

## 2022-12-08 ENCOUNTER — Encounter: Payer: Managed Care, Other (non HMO) | Attending: Internal Medicine | Admitting: *Deleted

## 2022-12-08 DIAGNOSIS — I214 Non-ST elevation (NSTEMI) myocardial infarction: Secondary | ICD-10-CM

## 2022-12-08 DIAGNOSIS — Z48812 Encounter for surgical aftercare following surgery on the circulatory system: Secondary | ICD-10-CM | POA: Insufficient documentation

## 2022-12-08 DIAGNOSIS — Z955 Presence of coronary angioplasty implant and graft: Secondary | ICD-10-CM | POA: Insufficient documentation

## 2022-12-08 NOTE — Progress Notes (Signed)
Daily Session Note  Patient Details  Name: Tammie Smith MRN: [id number] Date of Birth: 08/18/1956 Referring Provider:   Flowsheet Row Cardiac Rehab from 11/24/2022 in [facility] Cardiac and Pulmonary Rehab  Referring Provider End       Encounter Date: 12/08/2022  Check In:  Session Check In - 12/08/22 1558       Check-In   Supervising physician immediately available to respond to emergencies See telemetry face sheet for immediately available ER MD    Location [facility]-Cardiac & Pulmonary Rehab    Staff Present Meredith Craven, RN BSN;Megan Smith, RN, ADN;Joseph [place], RCP,RRT,BSRT    Virtual Visit No    Medication changes reported     No    Fall or balance concerns reported    No    Warm-up and Cool-down Performed on first and last piece of equipment    Resistance Training Performed Yes    VAD Patient? No    PAD/SET Patient? No      Pain Assessment   Currently in Pain? No/denies                Social History   Tobacco Use  Smoking Status Never  Smokeless Tobacco Never    Goals Met:  Independence with exercise equipment Exercise tolerated well No report of concerns or symptoms today Strength training completed today  Goals Unmet:  Not Applicable  Comments: Pt able to follow exercise prescription today without complaint.  Will continue to monitor for progression.    Dr. Mark Miller is Medical Director for [facility] Cardiac Rehabilitation.  Dr. Fuad Aleskerov is Medical Director for [facility] Pulmonary Rehabilitation. 

## 2022-12-09 ENCOUNTER — Encounter: Payer: Managed Care, Other (non HMO) | Admitting: *Deleted

## 2022-12-09 DIAGNOSIS — I214 Non-ST elevation (NSTEMI) myocardial infarction: Secondary | ICD-10-CM

## 2022-12-09 DIAGNOSIS — Z955 Presence of coronary angioplasty implant and graft: Secondary | ICD-10-CM

## 2022-12-09 NOTE — Progress Notes (Signed)
Daily Session Note  Patient Details  Name: Tammie Smith MRN: [id number] Date of Birth: Jan 21, 1956 Referring Provider:   Flowsheet Row Cardiac Rehab from 11/24/2022 in [facility] Cardiac and Pulmonary Rehab  Referring Provider End       Encounter Date: 12/09/2022  Check In:  Session Check In - 12/09/22 1558       Check-In   Supervising physician immediately available to respond to emergencies See telemetry face sheet for immediately available ER MD    Location [facility]-Cardiac & Pulmonary Rehab    Staff Present Renita Papa, RN BSN;Joseph Tessie Fass, RCP,RRT,BSRT;Noah [place], [place], Exercise Physiologist    Virtual Visit No    Medication changes reported     No    Fall or balance concerns reported    No    Warm-up and Cool-down Performed on first and last piece of equipment    Resistance Training Performed Yes    VAD Patient? No    PAD/SET Patient? No      Pain Assessment   Currently in Pain? No/denies                Social History   Tobacco Use  Smoking Status Never  Smokeless Tobacco Never    Goals Met:  Independence with exercise equipment Exercise tolerated well No report of concerns or symptoms today Strength training completed today  Goals Unmet:  Not Applicable  Comments: Pt able to follow exercise prescription today without complaint.  Will continue to monitor for progression.    Dr. Emily Filbert is Medical Director for [place].  Dr. Ottie Glazier is Medical Director for [facility] Pulmonary Rehabilitation.

## 2022-12-13 ENCOUNTER — Encounter: Payer: Managed Care, Other (non HMO) | Admitting: *Deleted

## 2022-12-13 DIAGNOSIS — I214 Non-ST elevation (NSTEMI) myocardial infarction: Secondary | ICD-10-CM

## 2022-12-13 DIAGNOSIS — Z955 Presence of coronary angioplasty implant and graft: Secondary | ICD-10-CM

## 2022-12-13 NOTE — Progress Notes (Signed)
Daily Session Note  Patient Details  Name: Tammie Smith MRN: [id number] Date of Birth: 07-Jun-1956 Referring Provider:   Flowsheet Row Cardiac Rehab from 11/24/2022 in [facility] Cardiac and Pulmonary Rehab  Referring Provider End       Encounter Date: 12/13/2022  Check In:  Session Check In - 12/13/22 1605       Check-In   Supervising physician immediately available to respond to emergencies See telemetry face sheet for immediately available ER MD    Location [facility]-Cardiac & Pulmonary Rehab    Staff Present Renita Papa, RN BSN;Joseph Tessie Fass, RCP,RRT,BSRT;Noah [place], [place], Exercise Physiologist    Virtual Visit No    Medication changes reported     No    Fall or balance concerns reported    No    Warm-up and Cool-down Performed on first and last piece of equipment    Resistance Training Performed Yes    VAD Patient? No    PAD/SET Patient? No      Pain Assessment   Currently in Pain? No/denies                Social History   Tobacco Use  Smoking Status Never  Smokeless Tobacco Never    Goals Met:  Independence with exercise equipment Exercise tolerated well No report of concerns or symptoms today Strength training completed today  Goals Unmet:  Not Applicable  Comments: Pt able to follow exercise prescription today without complaint.  Will continue to monitor for progression.    Dr. Emily Filbert is Medical Director for [place].  Dr. Ottie Glazier is Medical Director for [facility] Pulmonary Rehabilitation.

## 2022-12-16 ENCOUNTER — Encounter: Payer: Managed Care, Other (non HMO) | Admitting: *Deleted

## 2022-12-16 DIAGNOSIS — Z955 Presence of coronary angioplasty implant and graft: Secondary | ICD-10-CM

## 2022-12-16 DIAGNOSIS — I214 Non-ST elevation (NSTEMI) myocardial infarction: Secondary | ICD-10-CM | POA: Diagnosis not present

## 2022-12-16 NOTE — Progress Notes (Signed)
Daily Session Note  Patient Details  Name: Tammie Smith MRN: [id number] Date of Birth: 03-01-1956 Referring Provider:   Flowsheet Row Cardiac Rehab from 11/24/2022 in [facility] Cardiac and Pulmonary Rehab  Referring Provider End       Encounter Date: 12/16/2022  Check In:  Session Check In - 12/16/22 [place]       Check-In   Supervising physician immediately available to respond to emergencies See telemetry face sheet for immediately available ER MD    Location [facility]-Cardiac & Pulmonary Rehab    Staff Present Renita Papa, RN BSN;Joseph Tessie Fass, RCP,RRT,BSRT;Noah [place], [place], Exercise Physiologist    Virtual Visit No    Medication changes reported     No    Fall or balance concerns reported    No    Warm-up and Cool-down Performed on first and last piece of equipment    Resistance Training Performed Yes    VAD Patient? No    PAD/SET Patient? No      Pain Assessment   Currently in Pain? No/denies                Social History   Tobacco Use  Smoking Status Never  Smokeless Tobacco Never    Goals Met:  Independence with exercise equipment Exercise tolerated well No report of concerns or symptoms today Strength training completed today  Goals Unmet:  Not Applicable  Comments: Pt able to follow exercise prescription today without complaint.  Will continue to monitor for progression.    Dr. Emily Filbert is Medical Director for [facility].  Dr. Ottie Glazier is Medical Director for [facility] Pulmonary Rehabilitation.

## 2022-12-20 ENCOUNTER — Encounter: Payer: Managed Care, Other (non HMO) | Admitting: *Deleted

## 2022-12-20 DIAGNOSIS — I214 Non-ST elevation (NSTEMI) myocardial infarction: Secondary | ICD-10-CM

## 2022-12-20 DIAGNOSIS — Z955 Presence of coronary angioplasty implant and graft: Secondary | ICD-10-CM

## 2022-12-20 NOTE — Progress Notes (Signed)
Daily Session Note  Patient Details  Name: Tammie Smith MRN: [id number] Date of Birth: [birth year] Referring Provider:   Flowsheet Row Cardiac Rehab from 11/24/2022 in [facility] Cardiac and Pulmonary Rehab  Referring Provider End       Encounter Date: 12/20/2022  Check In:  Session Check In - 12/20/22 1557       Check-In   Supervising physician immediately available to respond to emergencies See telemetry face sheet for immediately available ER MD    Location [facility]-Cardiac & Pulmonary Rehab    Staff Present Renita Papa, RN BSN;Joseph Sammy Martinez, RCP,RRT,BSRT;Noah [place], [place], Exercise Physiologist    Virtual Visit No    Medication changes reported     No    Fall or balance concerns reported    Yes    Comments tripped over baby gate, bruised knee    Warm-up and Cool-down Performed on first and last piece of equipment    Resistance Training Performed Yes    VAD Patient? No      Pain Assessment   Currently in Pain? No/denies                Social History   Tobacco Use  Smoking Status Never  Smokeless Tobacco Never    Goals Met:  Independence with exercise equipment Exercise tolerated well No report of concerns or symptoms today Strength training completed today  Goals Unmet:  Not Applicable  Comments: Pt able to follow exercise prescription today without complaint.  Will continue to monitor for progression.    Dr. Emily Filbert is Medical Director for [place].  Dr. Ottie Glazier is Medical Director for [facility] Pulmonary Rehabilitation.

## 2022-12-22 ENCOUNTER — Encounter: Payer: Managed Care, Other (non HMO) | Admitting: *Deleted

## 2022-12-22 DIAGNOSIS — I214 Non-ST elevation (NSTEMI) myocardial infarction: Secondary | ICD-10-CM

## 2022-12-22 DIAGNOSIS — Z955 Presence of coronary angioplasty implant and graft: Secondary | ICD-10-CM

## 2022-12-22 NOTE — Progress Notes (Signed)
Daily Session Note  Patient Details  Name: Tammie Smith MRN: [id number] Date of Birth: 06-13-1956 Referring Provider:   Flowsheet Row Cardiac Rehab from 11/24/2022 in [facility] Cardiac and Pulmonary Rehab  Referring Provider End       Encounter Date: 12/22/2022  Check In:  Session Check In - 12/22/22 1605       Check-In   Supervising physician immediately available to respond to emergencies See telemetry face sheet for immediately available ER MD    Location [facility]-Cardiac & Pulmonary Rehab    Staff Present Renita Papa, RN BSN;Joseph [place], RCP,RRT,BSRT;Kelly [place], [place], ACSM CEP, Exercise Physiologist    Virtual Visit No    Medication changes reported     No    Fall or balance concerns reported    No    Warm-up and Cool-down Performed on first and last piece of equipment    Resistance Training Performed Yes    VAD Patient? No    PAD/SET Patient? No      Pain Assessment   Currently in Pain? No/denies                Social History   Tobacco Use  Smoking Status Never  Smokeless Tobacco Never    Goals Met:  Independence with exercise equipment Exercise tolerated well No report of concerns or symptoms today Strength training completed today  Goals Unmet:  Not Applicable  Comments: Pt able to follow exercise prescription today without complaint.  Will continue to monitor for progression.    Dr. Emily Filbert is Medical Director for [place].  Dr. Ottie Glazier is Medical Director for [facility] Pulmonary Rehabilitation.

## 2022-12-23 ENCOUNTER — Encounter: Payer: Managed Care, Other (non HMO) | Admitting: *Deleted

## 2022-12-23 DIAGNOSIS — I214 Non-ST elevation (NSTEMI) myocardial infarction: Secondary | ICD-10-CM

## 2022-12-23 DIAGNOSIS — Z955 Presence of coronary angioplasty implant and graft: Secondary | ICD-10-CM

## 2022-12-23 NOTE — Progress Notes (Signed)
Daily Session Note  Patient Details  Name: JOSELLE DEEDS MRN: [id number] Date of Birth: June 13, 1956 Referring Provider:   Flowsheet Row Cardiac Rehab from 11/24/2022 in [facility] Cardiac and Pulmonary Rehab  Referring Provider End       Encounter Date: 12/23/2022  Check In:  Session Check In - 12/23/22 1553       Check-In   Supervising physician immediately available to respond to emergencies See telemetry face sheet for immediately available ER MD    Location [facility]-Cardiac & Pulmonary Rehab    Staff Present Renita Papa, RN BSN;Joseph Tessie Fass, RCP,RRT,BSRT;Noah [place], [place], Exercise Physiologist    Virtual Visit No    Medication changes reported     No    Fall or balance concerns reported    No    Warm-up and Cool-down Performed on first and last piece of equipment    Resistance Training Performed Yes    VAD Patient? No    PAD/SET Patient? No      Pain Assessment   Currently in Pain? No/denies                Social History   Tobacco Use  Smoking Status Never  Smokeless Tobacco Never    Goals Met:  Independence with exercise equipment Exercise tolerated well No report of concerns or symptoms today Strength training completed today  Goals Unmet:  Not Applicable  Comments: Pt able to follow exercise prescription today without complaint.  Will continue to monitor for progression.    Dr. Emily Filbert is Medical Director for [place].  Dr. Ottie Glazier is Medical Director for [facility] Pulmonary Rehabilitation.

## 2022-12-27 ENCOUNTER — Encounter: Payer: Managed Care, Other (non HMO) | Admitting: *Deleted

## 2022-12-27 DIAGNOSIS — I214 Non-ST elevation (NSTEMI) myocardial infarction: Secondary | ICD-10-CM | POA: Diagnosis not present

## 2022-12-27 DIAGNOSIS — Z955 Presence of coronary angioplasty implant and graft: Secondary | ICD-10-CM

## 2022-12-27 NOTE — Progress Notes (Signed)
Daily Session Note  Patient Details  Name: Tammie Smith MRN: [id number] Date of Birth: 07-30-1956 Referring Provider:   Flowsheet Row Cardiac Rehab from 11/24/2022 in [facility] Cardiac and Pulmonary Rehab  Referring Provider End       Encounter Date: 12/27/2022  Check In:  Session Check In - 12/27/22 1629       Check-In   Supervising physician immediately available to respond to emergencies See telemetry face sheet for immediately available ER MD    Location [facility]-Cardiac & Pulmonary Rehab    Staff Present Renita Papa, RN BSN;Noah [place]ick[place], BS, Exercise Physiologist;Joseph Tessie Fass, [place]    Virtual Visit No    Medication changes reported     No    Fall or balance concerns reported    No    Warm-up and Cool-down Performed on first and last piece of equipment    Resistance Training Performed Yes    VAD Patient? No    PAD/SET Patient? No      Pain Assessment   Currently in Pain? No/denies                Social History   Tobacco Use  Smoking Status Never  Smokeless Tobacco Never    Goals Met:  Independence with exercise equipment Exercise tolerated well No report of concerns or symptoms today Strength training completed today  Goals Unmet:  Not Applicable  Comments: Pt able to follow exercise prescription today without complaint.  Will continue to monitor for progression.    Dr. Emily Filbert is Medical Director for [place].  Dr. Ottie Glazier is Medical Director for [facility] Pulmonary Rehabilitation.

## 2022-12-29 ENCOUNTER — Encounter: Payer: Managed Care, Other (non HMO) | Admitting: *Deleted

## 2022-12-29 ENCOUNTER — Encounter: Payer: Self-pay | Admitting: *Deleted

## 2022-12-29 DIAGNOSIS — I214 Non-ST elevation (NSTEMI) myocardial infarction: Secondary | ICD-10-CM

## 2022-12-29 DIAGNOSIS — Z955 Presence of coronary angioplasty implant and graft: Secondary | ICD-10-CM

## 2022-12-29 NOTE — Progress Notes (Signed)
Cardiac Individual Treatment Plan  Patient Details  Name: Tammie Smith MRN: [id number] Date of Birth: [birth year] Referring Provider:   Flowsheet Row Cardiac Rehab from 11/24/2022 in [facility] Cardiac and Pulmonary Rehab  Referring Provider End       Initial Encounter Date:  Flowsheet Row Cardiac Rehab from 11/24/2022 in [facility] Cardiac and Pulmonary Rehab  Date 11/24/22       Visit Diagnosis: NSTEMI (non-ST elevated myocardial infarction) [facility])  Status post coronary artery stent placement  Patient's Home Medications on Admission:  Current Outpatient Medications:    aspirin EC 81 MG tablet, Take 1 tablet (81 mg total) by mouth daily. Swallow whole., Disp: 90 tablet, Rfl: 3   atorvastatin (LIPITOR) 80 MG tablet, Take 1 tablet (80 mg total) by mouth daily., Disp: 90 tablet, Rfl: 3   DULoxetine (CYMBALTA) 30 MG capsule, Take 30 mg by mouth in the morning., Disp: , Rfl:    isosorbide mononitrate (IMDUR) 30 MG 24 hr tablet, Take 0.5 tablets (15 mg total) by mouth daily., Disp: 45 tablet, Rfl: 3   metFORMIN (GLUCOPHAGE-XR) 500 MG 24 hr tablet, Take 1 tablet (500 mg total) by mouth daily with supper., Disp: , Rfl:    nitroGLYCERIN (NITROSTAT) 0.4 MG SL tablet, Place 1 tablet (0.4 mg total) under the tongue every 5 (five) minutes as needed for chest pain., Disp: 90 tablet, Rfl: 3   propranolol (INDERAL) 40 MG tablet, Take 40 mg by mouth 2 (two) times daily., Disp: , Rfl:    RABEprazole (ACIPHEX) 20 MG tablet, Take 20 mg by mouth at bedtime., Disp: , Rfl:    ticagrelor (BRILINTA) 90 MG TABS tablet, Take 1 tablet (90 mg total) by mouth 2 (two) times daily., Disp: 180 tablet, Rfl: 3  Past Medical History: Past Medical History:  Diagnosis Date   Borderline glaucoma    Cancer ([place])    skin cancer basal cell   Coronary artery disease 08/16/2022   NSTEMI s/p PCI to OM1   Degenerative disc disease, lumbar    Gestational diabetes    Hyperlipemia    Right-sided carotid artery disease (HCC)     echo done    Tobacco Use: Social History   Tobacco Use  Smoking Status Never  Smokeless Tobacco Never    Labs: Review Flowsheet       Latest Ref Rng & Units 08/16/2022 08/18/2022 10/21/2022  Labs for ITP Cardiac and Pulmonary Rehab  Cholestrol 100 - 199 mg/dL - 248  181   LDL (calc) 0 - 99 mg/dL - 167  113   HDL-C >39 mg/dL - 37  41   Trlycerides 0 - 149 mg/dL - 219  155   Hemoglobin A1c 4.8 - 5.6 % 6.9  - -     Exercise Target Goals: Exercise Program Goal: Individual exercise prescription set using results from initial 6 min walk test and THRR while considering  patient's activity barriers and safety.   Exercise Prescription Goal: Initial exercise prescription builds to 30-45 minutes a day of aerobic activity, 2-3 days per week.  Home exercise guidelines will be given to patient during program as part of exercise prescription that the participant will acknowledge.   Education: Aerobic Exercise: - Group verbal and visual presentation on the components of exercise prescription. Introduces F.I.T.T principle from ACSM for exercise prescriptions.  Reviews F.I.T.T. principles of aerobic exercise including progression. Written material given at graduation.   Education: Resistance Exercise: - Group verbal and visual presentation on the components of exercise prescription. Introduces  F.I.T.T principle from ACSM for exercise prescriptions  Reviews F.I.T.T. principles of resistance exercise including progression. Written material given at graduation.    Education: Exercise & Equipment Safety: - Individual verbal instruction and demonstration of equipment use and safety with use of the equipment. Flowsheet Row Cardiac Rehab from 12/08/2022 in [facility] Cardiac and Pulmonary Rehab  Date 11/24/22  Educator [facility]  Instruction Review Code 1- Verbalizes Understanding       Education: Exercise Physiology & General Exercise Guidelines: - Group verbal and written instruction with models to  review the exercise physiology of the cardiovascular system and associated critical values. Provides general exercise guidelines with specific guidelines to those with heart or lung disease.    Education: Flexibility, Balance, Mind/Body Relaxation: - Group verbal and visual presentation with interactive activity on the components of exercise prescription. Introduces F.I.T.T principle from ACSM for exercise prescriptions. Reviews F.I.T.T. principles of flexibility and balance exercise training including progression. Also discusses the mind body connection.  Reviews various relaxation techniques to help reduce and manage stress (i.e. Deep breathing, progressive muscle relaxation, and visualization). Balance handout provided to take home. Written material given at graduation.   Activity Barriers & Risk Stratification:  Activity Barriers & Cardiac Risk Stratification - 11/24/22 1145       Activity Barriers & Cardiac Risk Stratification   Activity Barriers Joint Problems;Back Problems    Cardiac Risk Stratification High             6 Minute Walk:  6 Minute Walk     Row Name 11/24/22 1143         6 Minute Walk   Phase Initial     Distance 1370 feet     Walk Time 6 minutes     # of Rest Breaks 0     MPH 2.59     METS 3.35     RPE 7     Perceived Dyspnea  0     VO2 Peak 11.72     Symptoms No     Resting HR 84 bpm     Resting BP 112/62     Resting Oxygen Saturation  95 %     Exercise Oxygen Saturation  during 6 min walk 97 %     Max Ex. HR 105 bpm     Max Ex. BP 136/70     2 Minute Post BP 106/62              Oxygen Initial Assessment:   Oxygen Re-Evaluation:   Oxygen Discharge (Final Oxygen Re-Evaluation):   Initial Exercise Prescription:  Initial Exercise Prescription - 11/24/22 1100       Date of Initial Exercise RX and Referring Provider   Date 11/24/22    Referring Provider End      Oxygen   Maintain Oxygen Saturation 88% or higher      Treadmill    MPH 3    Grade 1    Minutes 15    METs 3.71      Recumbant Bike   Level 2    RPM 50    Minutes 15    METs 3.35      Arm Ergometer   Level 1    RPM 30    Minutes 15    METs 3.35      REL-XR   Level 2    Speed 50    Minutes 15    METs 3.35      Track   Laps 42  Minutes 15    METs 3.25      Prescription Details   Frequency (times per week) 3    Duration Progress to 30 minutes of continuous aerobic without signs/symptoms of physical distress      Intensity   THRR 40-80% of Max Heartrate 112-140    Ratings of Perceived Exertion 11-13    Perceived Dyspnea 0-4      Progression   Progression Continue to progress workloads to maintain intensity without signs/symptoms of physical distress.      Resistance Training   Training Prescription Yes    Weight 3    Reps 10-15             Perform Capillary Blood Glucose checks as needed.  Exercise Prescription Changes:   Exercise Prescription Changes     Row Name 11/24/22 1100 11/30/22 1300 12/13/22 1100 12/20/22 1600 12/27/22 1400     Response to Exercise   Blood Pressure (Admit) 112/62 102/60 126/68 -- 102/60   Blood Pressure (Exercise) 136/70 142/62 128/58 -- 142/68   Blood Pressure (Exit) 106/62 112/60 122/60 -- 112/60   Heart Rate (Admit) 84 bpm 82 bpm 77 bpm -- 70 bpm   Heart Rate (Exercise) 105 bpm 126 bpm 133 bpm -- 135 bpm   Heart Rate (Exit) 85 bpm 102 bpm 90 bpm -- 87 bpm   Oxygen Saturation (Admit) 95 % -- -- -- --   Oxygen Saturation (Exercise) 97 % -- -- -- --   Oxygen Saturation (Exit) 97 % -- -- -- --   Rating of Perceived Exertion (Exercise) 7 12 13 $ -- 12   Perceived Dyspnea (Exercise) 0 -- -- -- --   Symptoms none none none -- none   Comments 6 MWT results First full day of exercise -- -- --   Duration -- Progress to 30 minutes of  aerobic without signs/symptoms of physical distress Continue with 30 min of aerobic exercise without signs/symptoms of physical distress. -- Continue with 30 min  of aerobic exercise without signs/symptoms of physical distress.   Intensity -- THRR unchanged THRR unchanged -- THRR unchanged     Progression   Progression -- Continue to progress workloads to maintain intensity without signs/symptoms of physical distress. Continue to progress workloads to maintain intensity without signs/symptoms of physical distress. -- Continue to progress workloads to maintain intensity without signs/symptoms of physical distress.   Average METs -- 3.56 3.93 -- 3.99     Resistance Training   Training Prescription -- Yes Yes -- Yes   Weight -- 3 3 lb -- 3 lb   Reps -- 10-15 10-15 -- 10-15     Interval Training   Interval Training -- No No -- No     Treadmill   MPH -- 3 3 -- 3.2   Grade -- 1 1 -- 1   Minutes -- 15 15 -- 15   METs -- 3.71 3.71 -- 3.89     Recumbant Bike   Level -- -- 2 -- 3   Watts -- -- 30 -- 25   Minutes -- -- 15 -- 15   METs -- -- 3.45 -- 3.19     REL-XR   Level -- 2 2 -- 2   Minutes -- 15 15 -- 15   METs -- 3.4 4.6 -- 5     Home Exercise Plan   Plans to continue exercise at -- -- -- Home (comment)  walking, seated exercise machine Home (comment)  walking, seated exercise machine  Frequency -- -- -- Add 2 additional days to program exercise sessions. Add 2 additional days to program exercise sessions.   Initial Home Exercises Provided -- -- -- 12/20/22 12/20/22     Oxygen   Maintain Oxygen Saturation -- 88% or higher 88% or higher 88% or higher 88% or higher            Exercise Comments:   Exercise Comments     Row Name 11/25/22 1558           Exercise Comments First full day of exercise!  Patient was oriented to gym and equipment including functions, settings, policies, and procedures.  Patient's individual exercise prescription and treatment plan were reviewed.  All starting workloads were established based on the results of the 6 minute walk test done at initial orientation visit.  The plan for exercise progression was  also introduced and progression will be customized based on patient's performance and goals.                Exercise Goals and Review:   Exercise Goals     Row Name 11/24/22 1149             Exercise Goals   Increase Physical Activity Yes       Intervention Provide advice, education, support and counseling about physical activity/exercise needs.;Develop an individualized exercise prescription for aerobic and resistive training based on initial evaluation findings, risk stratification, comorbidities and participant's personal goals.       Expected Outcomes Short Term: Attend rehab on a regular basis to increase amount of physical activity.;Long Term: Add in home exercise to make exercise part of routine and to increase amount of physical activity.;Long Term: Exercising regularly at least 3-5 days a week.       Increase Strength and Stamina Yes       Intervention Provide advice, education, support and counseling about physical activity/exercise needs.;Develop an individualized exercise prescription for aerobic and resistive training based on initial evaluation findings, risk stratification, comorbidities and participant's personal goals.       Expected Outcomes Short Term: Increase workloads from initial exercise prescription for resistance, speed, and METs.;Short Term: Perform resistance training exercises routinely during rehab and add in resistance training at home;Long Term: Improve cardiorespiratory fitness, muscular endurance and strength as measured by increased METs and functional capacity (6MWT)       Able to understand and use rate of perceived exertion (RPE) scale Yes       Intervention Provide education and explanation on how to use RPE scale       Expected Outcomes Short Term: Able to use RPE daily in rehab to express subjective intensity level;Long Term:  Able to use RPE to guide intensity level when exercising independently       Able to understand and use Dyspnea scale Yes        Intervention Provide education and explanation on how to use Dyspnea scale       Expected Outcomes Long Term: Able to use Dyspnea scale to guide intensity level when exercising independently;Short Term: Able to use Dyspnea scale daily in rehab to express subjective sense of shortness of breath during exertion       Knowledge and understanding of Target Heart Rate Range (THRR) Yes       Intervention Provide education and explanation of THRR including how the numbers were predicted and where they are located for reference       Expected Outcomes Short Term: Able to state/look  up THRR;Long Term: Able to use THRR to govern intensity when exercising independently;Short Term: Able to use daily as guideline for intensity in rehab       Able to check pulse independently Yes       Intervention Provide education and demonstration on how to check pulse in carotid and radial arteries.;Review the importance of being able to check your own pulse for safety during independent exercise       Expected Outcomes Short Term: Able to explain why pulse checking is important during independent exercise       Understanding of Exercise Prescription Yes       Intervention Provide education, explanation, and written materials on patient's individual exercise prescription       Expected Outcomes Short Term: Able to explain program exercise prescription;Long Term: Able to explain home exercise prescription to exercise independently                Exercise Goals Re-Evaluation :  Exercise Goals Re-Evaluation     Row Name 11/25/22 1600 11/30/22 1343 12/13/22 1128 12/20/22 1637 12/27/22 1447     Exercise Goal Re-Evaluation   Exercise Goals Review Increase Physical Activity;Able to understand and use rate of perceived exertion (RPE) scale;Knowledge and understanding of Target Heart Rate Range (THRR);Understanding of Exercise Prescription;Increase Strength and Stamina;Able to check pulse independently Increase Physical  Activity;Understanding of Exercise Prescription;Increase Strength and Stamina Increase Physical Activity;Understanding of Exercise Prescription;Increase Strength and Stamina Increase Physical Activity;Understanding of Exercise Prescription;Increase Strength and Stamina;Able to understand and use rate of perceived exertion (RPE) scale;Able to check pulse independently;Knowledge and understanding of Target Heart Rate Range (THRR);Able to understand and use Dyspnea scale Increase Physical Activity;Increase Strength and Stamina;Understanding of Exercise Prescription   Comments Reviewed RPE scale, THR and program prescription with pt today.  Pt voiced understanding and was given a copy of goals to take home. Tammie Smith is off to a good start in rehab. She did well during her first session in rehab. She has an average MET level of 3.56 METs on the treadmill and XR. She did well with both machines at her initial exercise prescription. We will continue to monitor her progress in the program. Tammie Smith continues to do well in rehab. She has continued to work on her inital exercise prescription workloads and would benefit from starting to increase. Staff will remind patient to start moving her levels up. She is hitting her THR each session and RPEs are appropriate. Will continue to monitor. Reviewed home exercise with pt today.  Pt plans to walk and use her seated exercise machine for exercise. She does not know the name of it but states it is whole body where it utilizes both her arms and legs.  Reviewed THR, pulse, RPE, sign and symptoms, pulse oximetery and when to call 911 or MD.  Also discussed weather considerations and indoor options.  Pt voiced understanding. Tammie Smith continues to do well in rehab. She recently increased her overall average MET level to 3.99 METs. She also improved to level 3 on the recumbent bike. She increased her speed on the treadmill to 3.2 mph while maintaining an incline of 1% as well. We will continue to  monitor her progress.   Expected Outcomes Short: Use RPE daily to regulate intensity.  Long: Follow program prescription in THR. Short: Continue to follow current exercise prescription. Long: Continue to improve strength and stamina. Short: Start to increase workloads on all equipment (XR and RB to level 3) Long: Continue to increase overall  MET level Short: Start watching HR during exercise Long: Continue to exercise independently at home Short: Continue to increase treadmill workload, incline. Long: Continue to improve strength and stamina.            Discharge Exercise Prescription (Final Exercise Prescription Changes):  Exercise Prescription Changes - 12/27/22 1400       Response to Exercise   Blood Pressure (Admit) 102/60    Blood Pressure (Exercise) 142/68    Blood Pressure (Exit) 112/60    Heart Rate (Admit) 70 bpm    Heart Rate (Exercise) 135 bpm    Heart Rate (Exit) 87 bpm    Rating of Perceived Exertion (Exercise) 12    Symptoms none    Duration Continue with 30 min of aerobic exercise without signs/symptoms of physical distress.    Intensity THRR unchanged      Progression   Progression Continue to progress workloads to maintain intensity without signs/symptoms of physical distress.    Average METs 3.99      Resistance Training   Training Prescription Yes    Weight 3 lb    Reps 10-15      Interval Training   Interval Training No      Treadmill   MPH 3.2    Grade 1    Minutes 15    METs 3.89      Recumbant Bike   Level 3    Watts 25    Minutes 15    METs 3.19      REL-XR   Level 2    Minutes 15    METs 5      Home Exercise Plan   Plans to continue exercise at Home (comment)   walking, seated exercise machine   Frequency Add 2 additional days to program exercise sessions.    Initial Home Exercises Provided 12/20/22      Oxygen   Maintain Oxygen Saturation 88% or higher             Nutrition:  Target Goals: Understanding of nutrition  guidelines, daily intake of sodium '1500mg'$ , cholesterol '200mg'$ , calories 30% from fat and 7% or less from saturated fats, daily to have 5 or more servings of fruits and vegetables.  Education: All About Nutrition: -Group instruction provided by verbal, written material, interactive activities, discussions, models, and posters to present general guidelines for heart healthy nutrition including fat, fiber, MyPlate, the role of sodium in heart healthy nutrition, utilization of the nutrition label, and utilization of this knowledge for meal planning. Follow up email sent as well. Written material given at graduation.   Biometrics:  Pre Biometrics - 11/24/22 1150       Pre Biometrics   Height 5' 3.25" (1.607 m)    Weight 146 lb (66.2 kg)    Waist Circumference 35 inches    Hip Circumference 40 inches    Waist to Hip Ratio 0.88 %    BMI (Calculated) 25.64    Single Leg Stand 3.44 seconds              Nutrition Therapy Plan and Nutrition Goals:  Nutrition Therapy & Goals - 11/24/22 1114       Nutrition Therapy   Diet Heart healthy, low Na, T2DM    Drug/Food Interactions Statins/Certain Fruits    Protein (specify units) 55-65g    Fiber 25 grams    Whole Grain Foods 3 servings    Saturated Fats 12 max. grams    Fruits and Vegetables 8 servings/day  Sodium 2 grams      Personal Nutrition Goals   Nutrition Goal ST: review handouts LT: follow MyPlate guidelines, maintain A1C <7    Comments 67 y.o. F admitted to cardiac rehab s/p NSTEMI. PMHx includes HLD, DJD, T2DM, CAD, skin cancer (basal cell). Relevant medications reviewed. PYP Score: 65. Vegetables & Fruits 8/12. Breads, Grains & Cereals 8/12. Red & Processed Meat 10/12. Poultry 2/2. Fish & Shellfish 0/4. Beans, Nuts & Seeds 0/4. Milk & Dairy Foods 4/6. Toppings, Oils, Seasonings & Salt 16/20. Sweets, Snacks & Restaurant Food 7/14. Beverages 10/10. Tammie Smith reports that she is enjoying more bland food and she does not have much of an  appetite. She reports getting back her normal eating pattern as her husband did not enjoy many heart healthy foods. B: 2 eggs and spinach frittata with rye toast and small amount of butter, hot oatbran cereal with blueberries.  L: example salad with baked chicken or [place] sandwich (arnolds whole grain bread with nuts in it). She likes to treat herself to [employer] as well. D: sometimes does not eat. bowl of chicken noodle soup and bagel last night. She usually has sometime light. She reports her bigger meals are breakfast and lunch. Drinks: water. She uses olive oil most of the time. [place] reports cooking with some salt, but does not add any more. Discussed heart healthy eating and T2DM MNT.      Intervention Plan   Intervention Prescribe, educate and counsel regarding individualized specific dietary modifications aiming towards targeted core components such as weight, hypertension, lipid management, diabetes, heart failure and other comorbidities.;Nutrition handout(s) given to patient.    Expected Outcomes Short Term Goal: Understand basic principles of dietary content, such as calories, fat, sodium, cholesterol and nutrients.;Short Term Goal: A plan has been developed with personal nutrition goals set during dietitian appointment.;Long Term Goal: Adherence to prescribed nutrition plan.             Nutrition Assessments:  MEDIFICTS Score Key: ?70 Need to make dietary changes  40-70 Heart Healthy Diet ? 40 Therapeutic Level Cholesterol Diet  Flowsheet Row Cardiac Rehab from 11/23/2022 in [facility] Cardiac and Pulmonary Rehab  Picture Your Plate Total Score on Admission 65      Picture Your Plate Scores: <29 Unhealthy dietary pattern with much room for improvement. 41-50 Dietary pattern unlikely to meet recommendations for good health and room for improvement. 51-60 More healthful dietary pattern, with some room for improvement.  >60 Healthy dietary pattern, although there may be some specific  behaviors that could be improved.    Nutrition Goals Re-Evaluation:  Nutrition Goals Re-Evaluation     [place] Name 12/20/22 1619             Goals   Nutrition Goal ST: review handouts LT: follow MyPlate guidelines, maintain A1C <7       Comment Tammie Smith states she has been eating better overall. She has increased more vegetables by substituting her chips for "veggie" chips. She seasons them to taste as close as possible but enjoys doing it. Lately, she has been eating hot oatbrand cereal and adds mixed fresh berries to it for breakfast. Her recent  A1C was 6.9% and hopes to keep it below 7%.       Expected Outcome Short: Continue to increase more vegetable intake Long: Continue to eat heart healthy diet                Nutrition Goals Discharge (Final Nutrition Goals Re-Evaluation):  Nutrition Goals  Re-Evaluation - 12/20/22 1619       Goals   Nutrition Goal ST: review handouts LT: follow MyPlate guidelines, maintain A1C <7    Comment Tammie Smith states she has been eating better overall. She has increased more vegetables by substituting her chips for "veggie" chips. She seasons them to taste as close as possible but enjoys doing it. Lately, she has been eating hot oatbrand cereal and adds mixed fresh berries to it for breakfast. Her recent  A1C was 6.9% and hopes to keep it below 7%.    Expected Outcome Short: Continue to increase more vegetable intake Long: Continue to eat heart healthy diet             Psychosocial: Target Goals: Acknowledge presence or absence of significant depression and/or stress, maximize coping skills, provide positive support system. Participant is able to verbalize types and ability to use techniques and skills needed for reducing stress and depression.   Education: Stress, Anxiety, and Depression - Group verbal and visual presentation to define topics covered.  Reviews how body is impacted by stress, anxiety, and depression.  Also discusses healthy ways to  reduce stress and to treat/manage anxiety and depression.  Written material given at graduation. Flowsheet Row Cardiac Rehab from 12/08/2022 in [facility] Cardiac and Pulmonary Rehab  Date 12/08/22  Educator KW  Instruction Review Code 1- [employer] Understanding       Education: Sleep Hygiene -Provides group verbal and written instruction about how sleep can affect your health.  Define sleep hygiene, discuss sleep cycles and impact of sleep habits. Review good sleep hygiene tips.    Initial Review & Psychosocial Screening:  Initial Psych Review & Screening - 11/23/22 1008       Initial Review   Current issues with Current Stress Concerns;Current Depression    Source of Stress Concerns Family    Comments husband recently passed after a 2 year battle with esophagus cancer      Family Dynamics   Good Support System? Yes   three children     Barriers   Psychosocial barriers to participate in program There are no identifiable barriers or psychosocial needs.;The patient should benefit from training in stress management and relaxation.      Screening Interventions   Interventions Encouraged to exercise;Provide feedback about the scores to participant;To provide support and resources with identified psychosocial needs    Expected Outcomes Short Term goal: Utilizing psychosocial counselor, staff and physician to assist with identification of specific Stressors or current issues interfering with healing process. Setting desired goal for each stressor or current issue identified.;Long Term Goal: Stressors or current issues are controlled or eliminated.;Short Term goal: Identification and review with participant of any Quality of Life or Depression concerns found by scoring the questionnaire.;Long Term goal: The participant improves quality of Life and PHQ9 Scores as seen by post scores and/or verbalization of changes             Quality of Life Scores:   Quality of Life - 11/23/22 1031        Quality of Life   Select Quality of Life      Quality of Life Scores   Health/Function Pre 27.17 %    Socioeconomic Pre 23.94 %    Psych/Spiritual Pre 27.57 %    Family Pre 27 %    GLOBAL Pre 26.49 %            Scores of 19 and below usually indicate a poorer quality of life  in these areas.  A difference of  2-3 points is a clinically meaningful difference.  A difference of 2-3 points in the total score of the Quality of Life Index has been associated with significant improvement in overall quality of life, self-image, physical symptoms, and general health in studies assessing change in quality of life.  PHQ-9: Review Flowsheet       12/20/2022 11/24/2022  Depression screen PHQ 2/9  Decreased Interest 3 3  Down, Depressed, Hopeless 2 2  PHQ - 2 Score 5 5  Altered sleeping 2 2  Tired, decreased energy 3 3  Change in appetite 2 3  Feeling bad or failure about yourself  1 0  Trouble concentrating 1 1  Moving slowly or fidgety/restless 0 0  Suicidal thoughts 0 0  PHQ-9 Score 14 14  Difficult doing work/chores Very difficult Very difficult   Interpretation of Total Score  Total Score Depression Severity:  1-4 = Minimal depression, 5-9 = Mild depression, 10-14 = Moderate depression, 15-19 = Moderately severe depression, 20-27 = Severe depression   Psychosocial Evaluation and Intervention:  Psychosocial Evaluation - 11/23/22 1026       Psychosocial Evaluation & Interventions   Interventions Encouraged to exercise with the program and follow exercise prescription;Stress management education;Relaxation education    Comments Tammie Smith is coming to cardiac rehab after stents that were done at two separate procedures. Her first stent was in September and was the week after her husband passed away after battling 2 years with esophagus cancer. She was his main caretaker and mentioned her whole schedule was around his needs. So now she is trying to find a "new normal." She is still working  and has her 3 children as her main support system. Her heart trouble started right after the passing of her husband so she is tired and ready for a new routine where she can feel better. Her sleep has suffered for a while now, where she is up every few hours. She thinks that might have been because she had to be up giving him pain medicine and such, but now her body just can't rest longer than about 4 hours. She is hoping this program will help boost her stamina and increase her knowledge on heart healthy living.    Expected Outcomes Short: attend cardiac rehab for education and exercise. Long: develop and maintain positive self care habits.    Continue Psychosocial Services  Follow up required by staff             Psychosocial Re-Evaluation:  Psychosocial Re-Evaluation     [place] Name 12/20/22 1618             Psychosocial Re-Evaluation   Current issues with Current Stress Concerns;Current Depression       Comments Tammie Smith has been going through a tough time recently due to her dog. He had to get surgery which was already high risk and is in the process of recovering which is very stressful on her right now. She is still waiting to hear back if its cancer. She shared this dog with her husband who recently passed which is why this is emotional for her. She said she felt a little more positive today as the dog seemed more spry so she has high hopes he is feeling better. She says coming to rehab and exercising at a higher workloads helps her relieve her stress. She takes Cymbalta for depression and feels it does help her. She has great support from her kids.  Expected Outcomes Short: Enjoy time with dog, continue coming to rehab for stress management Long: Continue to maintain positive attitude and stay compliant with medication       Interventions Encouraged to attend Cardiac Rehabilitation for the exercise       Continue Psychosocial Services  Follow up required by staff                 Psychosocial Discharge (Final Psychosocial Re-Evaluation):  Psychosocial Re-Evaluation - 12/20/22 1618       Psychosocial Re-Evaluation   Current issues with Current Stress Concerns;Current Depression    Comments Tammie Smith has been going through a tough time recently due to her dog. He had to get surgery which was already high risk and is in the process of recovering which is very stressful on her right now. She is still waiting to hear back if its cancer. She shared this dog with her husband who recently passed which is why this is emotional for her. She said she felt a little more positive today as the dog seemed more spry so she has high hopes he is feeling better. She says coming to rehab and exercising at a higher workloads helps her relieve her stress. She takes Cymbalta for depression and feels it does help her. She has great support from her kids.    Expected Outcomes Short: Enjoy time with dog, continue coming to rehab for stress management Long: Continue to maintain positive attitude and stay compliant with medication    Interventions Encouraged to attend Cardiac Rehabilitation for the exercise    Continue Psychosocial Services  Follow up required by staff             Vocational Rehabilitation: Provide vocational rehab assistance to qualifying candidates.   Vocational Rehab Evaluation & Intervention:  Vocational Rehab - 11/23/22 1008       Initial Vocational Rehab Evaluation & Intervention   Assessment shows need for Vocational Rehabilitation No             Education: Education Goals: Education classes will be provided on a variety of topics geared toward better understanding of heart health and risk factor modification. Participant will state understanding/return demonstration of topics presented as noted by education test scores.  Learning Barriers/Preferences:  Learning Barriers/Preferences - 11/23/22 1008       Learning Barriers/Preferences   Learning Barriers  None    Learning Preferences None             General Cardiac Education Topics:  AED/CPR: - Group verbal and written instruction with the use of models to demonstrate the basic use of the AED with the basic ABC's of resuscitation.   Anatomy and Cardiac Procedures: - Group verbal and visual presentation and models provide information about basic cardiac anatomy and function. Reviews the testing methods done to diagnose heart disease and the outcomes of the test results. Describes the treatment choices: Medical Management, Angioplasty, or Coronary Bypass Surgery for treating various heart conditions including Myocardial Infarction, Angina, Valve Disease, and Cardiac Arrhythmias.  Written material given at graduation. Flowsheet Row Cardiac Rehab from 12/08/2022 in [facility] Cardiac and Pulmonary Rehab  Education need identified 11/24/22       Medication Safety: - Group verbal and visual instruction to review commonly prescribed medications for heart and lung disease. Reviews the medication, class of the drug, and side effects. Includes the steps to properly store meds and maintain the prescription regimen.  Written material given at graduation.   Intimacy: - Group verbal  instruction through game format to discuss how heart and lung disease can affect sexual intimacy. Written material given at graduation..   Know Your Numbers and Heart Failure: - Group verbal and visual instruction to discuss disease risk factors for cardiac and pulmonary disease and treatment options.  Reviews associated critical values for Overweight/Obesity, Hypertension, Cholesterol, and Diabetes.  Discusses basics of heart failure: signs/symptoms and treatments.  Introduces Heart Failure Zone chart for action plan for heart failure.  Written material given at graduation.   Infection Prevention: - Provides verbal and written material to individual with discussion of infection control including proper hand washing and  proper equipment cleaning during exercise session. Flowsheet Row Cardiac Rehab from 12/08/2022 in [facility] Cardiac and Pulmonary Rehab  Date 11/24/22  Educator [facility]  Instruction Review Code 1- Verbalizes Understanding       Falls Prevention: - Provides verbal and written material to individual with discussion of falls prevention and safety. Flowsheet Row Cardiac Rehab from 12/08/2022 in [facility] Cardiac and Pulmonary Rehab  Date 11/24/22  Educator [facility]  Instruction Review Code 1- Verbalizes Understanding       Other: -Provides group and verbal instruction on various topics (see comments)   Knowledge Questionnaire Score:  Knowledge Questionnaire Score - 11/23/22 1031       Knowledge Questionnaire Score   Pre Score 25/26             Core Components/Risk Factors/Patient Goals at Admission:  Personal Goals and Risk Factors at Admission - 11/24/22 1151       Core Components/Risk Factors/Patient Goals on Admission    Weight Management Yes    Intervention Weight Management: Develop a combined nutrition and exercise program designed to reach desired caloric intake, while maintaining appropriate intake of nutrient and fiber, sodium and fats, and appropriate energy expenditure required for the weight goal.;Weight Management: Provide education and appropriate resources to help participant work on and attain dietary goals.    Admit Weight 146 lb (66.2 kg)    Goal Weight: Short Term 146 lb (66.2 kg)    Goal Weight: Long Term 146 lb (66.2 kg)    Expected Outcomes Short Term: Continue to assess and modify interventions until short term weight is achieved;Long Term: Adherence to nutrition and physical activity/exercise program aimed toward attainment of established weight goal;Weight Maintenance: Understanding of the daily nutrition guidelines, which includes 25-35% calories from fat, 7% or less cal from saturated fats, less than '200mg'$  cholesterol, less than 1.5gm of sodium, & 5 or more servings of  fruits and vegetables daily;Understanding recommendations for meals to include 15-35% energy as protein, 25-35% energy from fat, 35-60% energy from carbohydrates, less than '200mg'$  of dietary cholesterol, 20-35 gm of total fiber daily;Understanding of distribution of calorie intake throughout the day with the consumption of 4-5 meals/snacks    Diabetes Yes    Intervention Provide education about signs/symptoms and action to take for hypo/hyperglycemia.;Provide education about proper nutrition, including hydration, and aerobic/resistive exercise prescription along with prescribed medications to achieve blood glucose in normal ranges: Fasting glucose 65-99 mg/dL    Expected Outcomes Short Term: Participant verbalizes understanding of the signs/symptoms and immediate care of hyper/hypoglycemia, proper foot care and importance of medication, aerobic/resistive exercise and nutrition plan for blood glucose control.;Long Term: Attainment of HbA1C < 7%.    Hypertension Yes    Intervention Provide education on lifestyle modifcations including regular physical activity/exercise, weight management, moderate sodium restriction and increased consumption of fresh fruit, vegetables, and low fat dairy, alcohol moderation, and  smoking cessation.;Monitor prescription use compliance.    Expected Outcomes Short Term: Continued assessment and intervention until BP is < 140/46m HG in hypertensive participants. < 130/879mHG in hypertensive participants with diabetes, heart failure or chronic kidney disease.;Long Term: Maintenance of blood pressure at goal levels.    Lipids Yes    Intervention Provide education and support for participant on nutrition & aerobic/resistive exercise along with prescribed medications to achieve LDL '70mg'$ , HDL >'40mg'$ .    Expected Outcomes Short Term: Participant states understanding of desired cholesterol values and is compliant with medications prescribed. Participant is following exercise prescription  and nutrition guidelines.;Long Term: Cholesterol controlled with medications as prescribed, with individualized exercise RX and with personalized nutrition plan. Value goals: LDL < '70mg'$ , HDL > 40 mg.             Education:Diabetes - Individual verbal and written instruction to review signs/symptoms of diabetes, desired ranges of glucose level fasting, after meals and with exercise. Acknowledge that pre and post exercise glucose checks will be done for 3 sessions at entry of program. [place] [facility]  Date 11/24/22  [facility] Code 1- Verbalizes Understanding       Core Components/Risk Factors/Patient Goals Review:   Goals and Risk Factor Review     Row Name 12/20/22 1621             Core Components/Risk Factors/Patient Goals Review   Personal Goals Review Weight Management/Obesity;Diabetes;Hypertension       Review Tammie Smith her A1C stays below 7%, last check in September it was 6.9%. She states she was never told she needed to check her sugars and was no resources to do so. Encouraged her to reach out to her doctor to make sure what she should be doing with her sugars. She takes Metformin for DM.  She generally doesnt eat a lot of high sugar foods or other food that would spike her sugar. She does not check weight but does not have any goals associated with weight. She does want to maintain but is not interested in taking any other action. She is taking all medicaitons as prescribed with no issues.       Expected Outcomes Short: Check with doctor on blood sugars Long: Continue to manage lifestyle risk factors long-term                Core Components/Risk Factors/Patient Goals at Discharge (Final Review):   Goals and Risk Factor Review - 12/20/22 1621       Core Components/Risk Factors/Patient Goals Review   Personal Goals Review Weight Management/Obesity;Diabetes;Hypertension    Review  Tammie Smith her A1C stays below 7%, last check in September it was 6.9%. She states she was never told she needed to check her sugars and was no resources to do so. Encouraged her to reach out to her doctor to make sure what she should be doing with her sugars. She takes Metformin for DM.  She generally doesnt eat a lot of high sugar foods or other food that would spike her sugar. She does not check weight but does not have any goals associated with weight. She does want to maintain but is not interested in taking any other action. She is taking all medicaitons as prescribed with no issues.    Expected Outcomes Short: Check with doctor on blood sugars Long: Continue to manage lifestyle risk factors long-term  ITP Comments:  ITP Comments     Row Name 11/23/22 1026 11/24/22 1143 11/25/22 1558 12/01/22 1032 12/29/22 0952   ITP Comments Initial phone call completed. Diagnosis can be found in [facility]. EP Orientation scheduled for Wednesday 12/20 at 1:30. Completed 6MWT and gym orientation. Initial ITP created and sent for review to Dr. Emily Filbert, Medical Director. First full day of exercise!  Patient was oriented to gym and equipment including functions, settings, policies, and procedures.  Patient's individual exercise prescription and treatment plan were reviewed.  All starting workloads were established based on the results of the 6 minute walk test done at initial orientation visit.  The plan for exercise progression was also introduced and progression will be customized based on patient's performance and goals. 30 Day review completed. Medical Director ITP review done, changes made as directed, and signed approval by Medical Director.     new to program 30 Day review completed. Medical Director ITP review done, changes made as directed, and signed approval by Medical Director.            Comments:

## 2022-12-29 NOTE — Progress Notes (Signed)
Daily Session Note  Patient Details  Name: Tammie Smith MRN: [id number] Date of Birth: 06/10/1956 Referring Provider:   Flowsheet Row Cardiac Rehab from 11/24/2022 in [facility] Cardiac and Pulmonary Rehab  Referring Provider End       Encounter Date: 12/29/2022  Check In:  Session Check In - 12/29/22 1603       Check-In   Supervising physician immediately available to respond to emergencies See telemetry face sheet for immediately available ER MD    Location [facility]-Cardiac & Pulmonary Rehab    Staff Present Renita Papa, RN BSN;Joseph Tessie Fass, Ernestina Patches, RN, [place]    Virtual Visit No    Medication changes reported     No    Fall or balance concerns reported    No    Warm-up and Cool-down Performed on first and last piece of equipment    Resistance Training Performed Yes    VAD Patient? No    PAD/SET Patient? No      Pain Assessment   Currently in Pain? No/denies                Social History   Tobacco Use  Smoking Status Never  Smokeless Tobacco Never    Goals Met:  Independence with exercise equipment Exercise tolerated well No report of concerns or symptoms today Strength training completed today  Goals Unmet:  Not Applicable  Comments: Pt able to follow exercise prescription today without complaint.  Will continue to monitor for progression.    Dr. Emily Filbert is Medical Director for [place].  Dr. Ottie Glazier is Medical Director for [facility] Pulmonary Rehabilitation.

## 2022-12-30 ENCOUNTER — Encounter: Payer: Managed Care, Other (non HMO) | Admitting: *Deleted

## 2022-12-30 DIAGNOSIS — I214 Non-ST elevation (NSTEMI) myocardial infarction: Secondary | ICD-10-CM | POA: Diagnosis not present

## 2022-12-30 DIAGNOSIS — Z955 Presence of coronary angioplasty implant and graft: Secondary | ICD-10-CM

## 2022-12-30 NOTE — Progress Notes (Signed)
Daily Session Note  Patient Details  Name: ANALYAH MCCONNON MRN: [id number] Date of Birth: [birth year] Referring Provider:   Flowsheet Row Cardiac Rehab from 11/24/2022 in [facility] Cardiac and Pulmonary Rehab  Referring Provider End       Encounter Date: 12/30/2022  Check In:  Session Check In - 12/30/22 1608       Check-In   Supervising physician immediately available to respond to emergencies See telemetry face sheet for immediately available ER MD    Location [facility]-Cardiac & Pulmonary Rehab    Staff Present Renita Papa, RN BSN;Joseph Tessie Fass, RCP,RRT,BSRT;Noah [place], [place], Exercise Physiologist    Virtual Visit No    Medication changes reported     No    Fall or balance concerns reported    No    Warm-up and Cool-down Performed on first and last piece of equipment    Resistance Training Performed Yes    VAD Patient? No    PAD/SET Patient? No      Pain Assessment   Currently in Pain? No/denies                Social History   Tobacco Use  Smoking Status Never  Smokeless Tobacco Never    Goals Met:  Independence with exercise equipment Exercise tolerated well No report of concerns or symptoms today Strength training completed today  Goals Unmet:  Not Applicable  Comments: Pt able to follow exercise prescription today without complaint.  Will continue to monitor for progression.    Dr. Emily Filbert is Medical Director for [place].  Dr. Ottie Glazier is Medical Director for [facility] Pulmonary Rehabilitation.

## 2023-01-03 ENCOUNTER — Encounter: Payer: Managed Care, Other (non HMO) | Admitting: *Deleted

## 2023-01-03 DIAGNOSIS — I214 Non-ST elevation (NSTEMI) myocardial infarction: Secondary | ICD-10-CM

## 2023-01-03 DIAGNOSIS — Z955 Presence of coronary angioplasty implant and graft: Secondary | ICD-10-CM

## 2023-01-03 NOTE — Progress Notes (Signed)
Daily Session Note  Patient Details  Name: Tammie Smith MRN: [id number] Date of Birth: 1956-04-06 Referring Provider:   Flowsheet Row Cardiac Rehab from 11/24/2022 in [facility] Cardiac and Pulmonary Rehab  Referring Provider End       Encounter Date: 01/03/2023  Check In:  Session Check In - 01/03/23 1605       Check-In   Supervising physician immediately available to respond to emergencies See telemetry face sheet for immediately available ER MD    Location [facility]-Cardiac & Pulmonary Rehab    Staff Present Renita Papa, RN BSN;Joseph Tessie Fass, RCP,RRT,BSRT;Noah [place], [place], Exercise Physiologist    Virtual Visit No    Medication changes reported     No    Fall or balance concerns reported    No    Warm-up and Cool-down Performed on first and last piece of equipment    Resistance Training Performed Yes    VAD Patient? No    PAD/SET Patient? No      Pain Assessment   Currently in Pain? No/denies                Social History   Tobacco Use  Smoking Status Never  Smokeless Tobacco Never    Goals Met:  Independence with exercise equipment Exercise tolerated well No report of concerns or symptoms today Strength training completed today  Goals Unmet:  Not Applicable  Comments: Pt able to follow exercise prescription today without complaint.  Will continue to monitor for progression.    Dr. Emily Filbert is Medical Director for [place].  Dr. Ottie Glazier is Medical Director for [facility] Pulmonary Rehabilitation.

## 2023-01-05 ENCOUNTER — Encounter: Payer: Managed Care, Other (non HMO) | Admitting: *Deleted

## 2023-01-05 DIAGNOSIS — I214 Non-ST elevation (NSTEMI) myocardial infarction: Secondary | ICD-10-CM | POA: Diagnosis not present

## 2023-01-05 DIAGNOSIS — Z955 Presence of coronary angioplasty implant and graft: Secondary | ICD-10-CM

## 2023-01-05 NOTE — Progress Notes (Signed)
Daily Session Note  Patient Details  Name: Tammie Smith MRN: [id number] Date of Birth: 1956-11-22 Referring Provider:   Flowsheet Row Cardiac Rehab from 11/24/2022 in [facility] Cardiac and Pulmonary Rehab  Referring Provider End       Encounter Date: 01/05/2023  Check In:  Session Check In - 01/05/23 1556       Check-In   Supervising physician immediately available to respond to emergencies See telemetry face sheet for immediately available ER MD    Location [facility]-Cardiac & Pulmonary Rehab    Staff Present Renita Papa, RN Odelia Gage, RN, ADN;Susanne Bice, RN, BSN, CCRP    Virtual Visit No    Medication changes reported     No    Fall or balance concerns reported    No    Warm-up and Cool-down Performed on first and last piece of equipment    Resistance Training Performed Yes    VAD Patient? No    PAD/SET Patient? No      Pain Assessment   Currently in Pain? No/denies                Social History   Tobacco Use  Smoking Status Never  Smokeless Tobacco Never    Goals Met:  Independence with exercise equipment Exercise tolerated well No report of concerns or symptoms today Strength training completed today  Goals Unmet:  Not Applicable  Comments: Pt able to follow exercise prescription today without complaint.  Will continue to monitor for progression.    Dr. Emily Filbert is Medical Director for [place].  Dr. Ottie Glazier is Medical Director for [facility] Pulmonary Rehabilitation.

## 2023-01-06 ENCOUNTER — Encounter: Payer: Managed Care, Other (non HMO) | Attending: Internal Medicine | Admitting: *Deleted

## 2023-01-06 DIAGNOSIS — Z955 Presence of coronary angioplasty implant and graft: Secondary | ICD-10-CM | POA: Insufficient documentation

## 2023-01-06 DIAGNOSIS — Z48812 Encounter for surgical aftercare following surgery on the circulatory system: Secondary | ICD-10-CM | POA: Diagnosis not present

## 2023-01-06 DIAGNOSIS — I214 Non-ST elevation (NSTEMI) myocardial infarction: Secondary | ICD-10-CM

## 2023-01-06 DIAGNOSIS — I252 Old myocardial infarction: Secondary | ICD-10-CM | POA: Diagnosis not present

## 2023-01-06 NOTE — Progress Notes (Signed)
Daily Session Note  Patient Details  Name: Tammie Smith MRN: [id number] Date of Birth: [birth year] Referring Provider:   Flowsheet Row Cardiac Rehab from 11/24/2022 in [facility] Cardiac and Pulmonary Rehab  Referring Provider End       Encounter Date: 01/06/2023  Check In:  Session Check In - 01/06/23 1554       Check-In   Supervising physician immediately available to respond to emergencies See telemetry face sheet for immediately available ER MD    Location [facility]-Cardiac & Pulmonary Rehab    Staff Present Renita Papa, RN BSN;Joseph [place], RCP,RRT,BSRT;Jessica Wilsey, [place], RCEP, CCRP, CCET    Virtual Visit No    Medication changes reported     No    Fall or balance concerns reported    No    Warm-up and Cool-down Performed on first and last piece of equipment    Resistance Training Performed Yes    VAD Patient? No    PAD/SET Patient? No      Pain Assessment   Currently in Pain? No/denies                Social History   Tobacco Use  Smoking Status Never  Smokeless Tobacco Never    Goals Met:  Independence with exercise equipment Exercise tolerated well No report of concerns or symptoms today Strength training completed today  Goals Unmet:  Not Applicable  Comments: Pt able to follow exercise prescription today without complaint.  Will continue to monitor for progression.    Dr. Emily Filbert is Medical Director for [place].  Dr. Ottie Glazier is Medical Director for [facility] Pulmonary Rehabilitation.

## 2023-01-10 ENCOUNTER — Encounter: Payer: Managed Care, Other (non HMO) | Admitting: *Deleted

## 2023-01-10 DIAGNOSIS — Z955 Presence of coronary angioplasty implant and graft: Secondary | ICD-10-CM | POA: Diagnosis not present

## 2023-01-10 DIAGNOSIS — I214 Non-ST elevation (NSTEMI) myocardial infarction: Secondary | ICD-10-CM

## 2023-01-10 NOTE — Progress Notes (Signed)
Daily Session Note  Patient Details  Name: Tammie Smith MRN: [id number] Date of Birth: [birth year] Referring Provider:   Flowsheet Row Cardiac Rehab from 11/24/2022 in [facility] Cardiac and Pulmonary Rehab  Referring Provider End       Encounter Date: 01/10/2023  Check In:  Session Check In - 01/10/23 1616       Check-In   Supervising physician immediately available to respond to emergencies See telemetry face sheet for immediately available ER MD    Location [facility]-Cardiac & Pulmonary Rehab    Staff Present Renita Papa, RN BSN;Noah Tickle, BS, Exercise Physiologist;Laureen Owens Shark, BS, RRT, CPFT    Virtual Visit No    Medication changes reported     No    Fall or balance concerns reported    No    Warm-up and Cool-down Performed on first and last piece of equipment    Resistance Training Performed Yes    VAD Patient? No    PAD/SET Patient? No      Pain Assessment   Currently in Pain? No/denies                Social History   Tobacco Use  Smoking Status Never  Smokeless Tobacco Never    Goals Met:  Independence with exercise equipment Exercise tolerated well No report of concerns or symptoms today Strength training completed today  Goals Unmet:  Not Applicable  Comments: Pt able to follow exercise prescription today without complaint.  Will continue to monitor for progression.    Dr. Emily Filbert is Medical Director for [place].  Dr. Ottie Glazier is Medical Director for [facility] Pulmonary Rehabilitation.

## 2023-01-12 ENCOUNTER — Encounter: Payer: Managed Care, Other (non HMO) | Admitting: *Deleted

## 2023-01-12 DIAGNOSIS — Z955 Presence of coronary angioplasty implant and graft: Secondary | ICD-10-CM | POA: Diagnosis not present

## 2023-01-12 DIAGNOSIS — I214 Non-ST elevation (NSTEMI) myocardial infarction: Secondary | ICD-10-CM

## 2023-01-12 NOTE — Progress Notes (Signed)
Daily Session Note  Patient Details  Name: Tammie Smith MRN: [id number] Date of Birth: [birth year] Referring Provider:   Flowsheet Row Cardiac Rehab from 11/24/2022 in [facility] Cardiac and Pulmonary Rehab  Referring Provider End       Encounter Date: 01/12/2023  Check In:  Session Check In - 01/12/23 1603       Check-In   Supervising physician immediately available to respond to emergencies See telemetry face sheet for immediately available ER MD    Location [facility]-Cardiac & Pulmonary Rehab    Staff Present Renita Papa, RN Odelia Gage, RN, Doyce Para, BS, ACSM CEP, Exercise Physiologist    Virtual Visit No    Medication changes reported     No    Fall or balance concerns reported    No    Warm-up and Cool-down Performed on first and last piece of equipment    Resistance Training Performed Yes    VAD Patient? No    PAD/SET Patient? No      Pain Assessment   Currently in Pain? No/denies                Social History   Tobacco Use  Smoking Status Never  Smokeless Tobacco Never    Goals Met:  Independence with exercise equipment Exercise tolerated well No report of concerns or symptoms today Strength training completed today  Goals Unmet:  Not Applicable  Comments: Pt able to follow exercise prescription today without complaint.  Will continue to monitor for progression.    Dr. Emily Filbert is Medical Director for [place].  Dr. Ottie Glazier is Medical Director for [facility] Pulmonary Rehabilitation.

## 2023-01-13 ENCOUNTER — Encounter: Payer: Managed Care, Other (non HMO) | Admitting: *Deleted

## 2023-01-13 DIAGNOSIS — I214 Non-ST elevation (NSTEMI) myocardial infarction: Secondary | ICD-10-CM

## 2023-01-13 DIAGNOSIS — Z955 Presence of coronary angioplasty implant and graft: Secondary | ICD-10-CM | POA: Diagnosis not present

## 2023-01-13 NOTE — Progress Notes (Signed)
Daily Session Note  Patient Details  Name: Tammie Smith MRN: [id number] Date of Birth: 12-Jan-1956 Referring Provider:   Flowsheet Row Cardiac Rehab from 11/24/2022 in [facility] Cardiac and Pulmonary Rehab  Referring Provider End       Encounter Date: 01/13/2023  Check In:  Session Check In - 01/13/23 1618       Check-In   Supervising physician immediately available to respond to emergencies See telemetry face sheet for immediately available ER MD    Location [facility]-Cardiac & Pulmonary Rehab    Staff Present Renita Papa, RN BSN;Joseph Tessie Fass, RCP,RRT,BSRT;Noah [place], [place], Exercise Physiologist    Virtual Visit No    Medication changes reported     No    Fall or balance concerns reported    No    Warm-up and Cool-down Performed on first and last piece of equipment    Resistance Training Performed Yes    VAD Patient? No    PAD/SET Patient? No      Pain Assessment   Currently in Pain? No/denies                Social History   Tobacco Use  Smoking Status Never  Smokeless Tobacco Never    Goals Met:  Independence with exercise equipment Exercise tolerated well No report of concerns or symptoms today Strength training completed today  Goals Unmet:  Not Applicable  Comments: Pt able to follow exercise prescription today without complaint.  Will continue to monitor for progression.    Dr. Emily Filbert is Medical Director for [place].  Dr. Ottie Glazier is Medical Director for [facility] Pulmonary Rehabilitation.

## 2023-01-17 ENCOUNTER — Encounter: Payer: Managed Care, Other (non HMO) | Admitting: *Deleted

## 2023-01-17 DIAGNOSIS — Z955 Presence of coronary angioplasty implant and graft: Secondary | ICD-10-CM | POA: Diagnosis not present

## 2023-01-17 DIAGNOSIS — I214 Non-ST elevation (NSTEMI) myocardial infarction: Secondary | ICD-10-CM

## 2023-01-17 NOTE — Progress Notes (Signed)
Daily Session Note  Patient Details  Name: Tammie Smith MRN: [id number] Date of Birth: Dec 14, 1955 Referring Provider:   Flowsheet Row Cardiac Rehab from 11/24/2022 in [facility] Cardiac and Pulmonary Rehab  Referring Provider End       Encounter Date: 01/17/2023  Check In:  Session Check In - 01/17/23 1602       Check-In   Supervising physician immediately available to respond to emergencies See telemetry face sheet for immediately available ER MD    Location [facility]-Cardiac & Pulmonary Rehab    Staff Present Renita Papa, RN BSN;Joseph [place], RCP,RRT,BSRT;Jessica [place], [place], RCEP, CCRP, CCET    Virtual Visit No    Medication changes reported     No    Fall or balance concerns reported    No    Warm-up and Cool-down Performed on first and last piece of equipment    Resistance Training Performed Yes    VAD Patient? No    PAD/SET Patient? No      Pain Assessment   Currently in Pain? No/denies                Social History   Tobacco Use  Smoking Status Never  Smokeless Tobacco Never    Goals Met:  Independence with exercise equipment Exercise tolerated well No report of concerns or symptoms today Strength training completed today  Goals Unmet:  Not Applicable  Comments: Pt able to follow exercise prescription today without complaint.  Will continue to monitor for progression.    Dr. Emily Filbert is Medical Director for [place].  Dr. Ottie Glazier is Medical Director for [facility] Pulmonary Rehabilitation.

## 2023-01-19 ENCOUNTER — Encounter: Payer: Managed Care, Other (non HMO) | Admitting: *Deleted

## 2023-01-19 DIAGNOSIS — I214 Non-ST elevation (NSTEMI) myocardial infarction: Secondary | ICD-10-CM

## 2023-01-19 DIAGNOSIS — Z955 Presence of coronary angioplasty implant and graft: Secondary | ICD-10-CM

## 2023-01-19 NOTE — Progress Notes (Signed)
Daily Session Note  Patient Details  Name: Tammie Smith MRN: [id number] Date of Birth: 11-12-1956 Referring Provider:   Flowsheet Row Cardiac Rehab from 11/24/2022 in [facility] Cardiac and Pulmonary Rehab  Referring Provider End       Encounter Date: 01/19/2023  Check In:  Session Check In - 01/19/23 1648       Check-In   Supervising physician immediately available to respond to emergencies See telemetry face sheet for immediately available ER MD    Location [facility]-Cardiac & Pulmonary Rehab    Staff Present Renita Papa, RN Moises Blood, BS, ACSM CEP, Exercise Physiologist;Joseph Tessie Fass, [place]    Virtual Visit No    Medication changes reported     No    Fall or balance concerns reported    No    Warm-up and Cool-down Performed on first and last piece of equipment    Resistance Training Performed Yes    VAD Patient? No    PAD/SET Patient? No      Pain Assessment   Currently in Pain? No/denies                Social History   Tobacco Use  Smoking Status Never  Smokeless Tobacco Never    Goals Met:  Independence with exercise equipment Exercise tolerated well No report of concerns or symptoms today Strength training completed today  Goals Unmet:  Not Applicable  Comments: Pt able to follow exercise prescription today without complaint.  Will continue to monitor for progression.    Dr. Emily Filbert is Medical Director for [place].  Dr. Ottie Glazier is Medical Director for [facility] Pulmonary Rehabilitation.

## 2023-01-20 ENCOUNTER — Encounter: Payer: Managed Care, Other (non HMO) | Admitting: *Deleted

## 2023-01-20 DIAGNOSIS — I214 Non-ST elevation (NSTEMI) myocardial infarction: Secondary | ICD-10-CM

## 2023-01-20 DIAGNOSIS — Z955 Presence of coronary angioplasty implant and graft: Secondary | ICD-10-CM

## 2023-01-20 NOTE — Progress Notes (Signed)
Daily Session Note  Patient Details  Name: Tammie Smith MRN: [id number] Date of Birth: 09-14-1956 Referring Provider:   Flowsheet Row Cardiac Rehab from 11/24/2022 in [facility] Cardiac and Pulmonary Rehab  Referring Provider End       Encounter Date: 01/20/2023  Check In:  Session Check In - 01/20/23 1559       Check-In   Supervising physician immediately available to respond to emergencies See telemetry face sheet for immediately available ER MD    Location [facility]-Cardiac & Pulmonary Rehab    Staff Present Renita Papa, RN BSN;Joseph Tessie Fass, [place]    Virtual Visit No    Medication changes reported     No    Fall or balance concerns reported    No    Warm-up and Cool-down Performed on first and last piece of equipment    Resistance Training Performed Yes    VAD Patient? No      Pain Assessment   Currently in Pain? No/denies                Social History   Tobacco Use  Smoking Status Never  Smokeless Tobacco Never    Goals Met:  Independence with exercise equipment Exercise tolerated well No report of concerns or symptoms today Strength training completed today  Goals Unmet:  Not Applicable  Comments: Pt able to follow exercise prescription today without complaint.  Will continue to monitor for progression.    Dr. Emily Filbert is Medical Director for [place].  Dr. Ottie Glazier is Medical Director for [facility] Pulmonary Rehabilitation.

## 2023-01-24 ENCOUNTER — Encounter: Payer: Managed Care, Other (non HMO) | Admitting: *Deleted

## 2023-01-24 DIAGNOSIS — I214 Non-ST elevation (NSTEMI) myocardial infarction: Secondary | ICD-10-CM

## 2023-01-24 DIAGNOSIS — Z955 Presence of coronary angioplasty implant and graft: Secondary | ICD-10-CM | POA: Diagnosis not present

## 2023-01-24 NOTE — Progress Notes (Signed)
Daily Session Note  Patient Details  Name: RHIANNON BRIEN MRN: [id number] Date of Birth: [birth year] Referring Provider:   Flowsheet Row Cardiac Rehab from 11/24/2022 in [facility] Cardiac and Pulmonary Rehab  Referring Provider End       Encounter Date: 01/24/2023  Check In:  Session Check In - 01/24/23 1603       Check-In   Supervising physician immediately available to respond to emergencies See telemetry face sheet for immediately available ER MD    Location [facility]-Cardiac & Pulmonary Rehab    Staff Present Renita Papa, RN BSN;Noah Tickle, BS, Exercise Physiologist;Joseph Tessie Fass, [place]    Virtual Visit No    Medication changes reported     No    Fall or balance concerns reported    No    Warm-up and Cool-down Performed on first and last piece of equipment    Resistance Training Performed Yes    VAD Patient? No    PAD/SET Patient? No      Pain Assessment   Currently in Pain? No/denies                Social History   Tobacco Use  Smoking Status Never  Smokeless Tobacco Never    Goals Met:  Independence with exercise equipment Exercise tolerated well No report of concerns or symptoms today Strength training completed today  Goals Unmet:  Not Applicable  Comments: Pt able to follow exercise prescription today without complaint.  Will continue to monitor for progression.    Dr. Emily Filbert is Medical Director for [place].  Dr. Ottie Glazier is Medical Director for [facility] Pulmonary Rehabilitation.

## 2023-01-26 ENCOUNTER — Encounter: Payer: Managed Care, Other (non HMO) | Admitting: *Deleted

## 2023-01-26 ENCOUNTER — Encounter: Payer: Self-pay | Admitting: *Deleted

## 2023-01-26 DIAGNOSIS — Z955 Presence of coronary angioplasty implant and graft: Secondary | ICD-10-CM

## 2023-01-26 DIAGNOSIS — I214 Non-ST elevation (NSTEMI) myocardial infarction: Secondary | ICD-10-CM

## 2023-01-26 NOTE — Progress Notes (Signed)
Daily Session Note  Patient Details  Name: Tammie Smith MRN: [id number] Date of Birth: Dec 10, 1955 Referring Provider:   Flowsheet Row Cardiac Rehab from 11/24/2022 in [facility] Cardiac and Pulmonary Rehab  Referring Provider End       Encounter Date: 01/26/2023  Check In:  Session Check In - 01/26/23 1616       Check-In   Supervising physician immediately available to respond to emergencies See telemetry face sheet for immediately available ER MD    Location [facility]-Cardiac & Pulmonary Rehab    Staff Present Renita Papa, RN BSN;Joseph [place], Darla Lesches, MS, ACSM CEP, Exercise Physiologist;Kelly Amedeo Plenty, BS, ACSM CEP, Exercise Physiologist    Virtual Visit No    Medication changes reported     No    Fall or balance concerns reported    No    Warm-up and Cool-down Performed on first and last piece of equipment    Resistance Training Performed Yes    VAD Patient? No    PAD/SET Patient? No      Pain Assessment   Currently in Pain? No/denies                Social History   Tobacco Use  Smoking Status Never  Smokeless Tobacco Never    Goals Met:  Independence with exercise equipment Exercise tolerated well No report of concerns or symptoms today Strength training completed today  Goals Unmet:  Not Applicable  Comments: Pt able to follow exercise prescription today without complaint.  Will continue to monitor for progression.    Dr. Emily Filbert is Medical Director for [place].  Dr. Ottie Glazier is Medical Director for [facility] Pulmonary Rehabilitation.

## 2023-01-26 NOTE — Progress Notes (Signed)
Cardiac Individual Treatment Plan  Patient Details  Name: Tammie Smith MRN: [id number] Date of Birth: [birth year] Referring Provider:   Flowsheet Row Cardiac Rehab from 11/24/2022 in [facility] Cardiac and Pulmonary Rehab  Referring Provider End       Initial Encounter Date:  Flowsheet Row Cardiac Rehab from 11/24/2022 in [facility] Cardiac and Pulmonary Rehab  Date 11/24/22       Visit Diagnosis: NSTEMI (non-ST elevated myocardial infarction) [facility])  Status post coronary artery stent placement  Patient's Home Medications on Admission:  Current Outpatient Medications:    aspirin EC 81 MG tablet, Take 1 tablet (81 mg total) by mouth daily. Swallow whole., Disp: 90 tablet, Rfl: 3   atorvastatin (LIPITOR) 80 MG tablet, Take 1 tablet (80 mg total) by mouth daily., Disp: 90 tablet, Rfl: 3   DULoxetine (CYMBALTA) 30 MG capsule, Take 30 mg by mouth in the morning., Disp: , Rfl:    isosorbide mononitrate (IMDUR) 30 MG 24 hr tablet, Take 0.5 tablets (15 mg total) by mouth daily., Disp: 45 tablet, Rfl: 3   metFORMIN (GLUCOPHAGE-XR) 500 MG 24 hr tablet, Take 1 tablet (500 mg total) by mouth daily with supper., Disp: , Rfl:    nitroGLYCERIN (NITROSTAT) 0.4 MG SL tablet, Place 1 tablet (0.4 mg total) under the tongue every 5 (five) minutes as needed for chest pain., Disp: 90 tablet, Rfl: 3   propranolol (INDERAL) 40 MG tablet, Take 40 mg by mouth 2 (two) times daily., Disp: , Rfl:    RABEprazole (ACIPHEX) 20 MG tablet, Take 20 mg by mouth at bedtime., Disp: , Rfl:    ticagrelor (BRILINTA) 90 MG TABS tablet, Take 1 tablet (90 mg total) by mouth 2 (two) times daily., Disp: 180 tablet, Rfl: 3  Past Medical History: Past Medical History:  Diagnosis Date   Borderline glaucoma    Cancer ([place])    skin cancer basal cell   Coronary artery disease 08/16/2022   NSTEMI s/p PCI to OM1   Degenerative disc disease, lumbar    Gestational diabetes    Hyperlipemia    Right-sided carotid artery disease (HCC)     echo done    Tobacco Use: Social History   Tobacco Use  Smoking Status Never  Smokeless Tobacco Never    Labs: Review Flowsheet       Latest Ref Rng & Units 08/16/2022 08/18/2022 10/21/2022  Labs for ITP Cardiac and Pulmonary Rehab  Cholestrol 100 - 199 mg/dL - 248  181   LDL (calc) 0 - 99 mg/dL - 167  113   HDL-C >39 mg/dL - 37  41   Trlycerides 0 - 149 mg/dL - 219  155   Hemoglobin A1c 4.8 - 5.6 % 6.9  - -     Exercise Target Goals: Exercise Program Goal: Individual exercise prescription set using results from initial 6 min walk test and THRR while considering  patient's activity barriers and safety.   Exercise Prescription Goal: Initial exercise prescription builds to 30-45 minutes a day of aerobic activity, 2-3 days per week.  Home exercise guidelines will be given to patient during program as part of exercise prescription that the participant will acknowledge.   Education: Aerobic Exercise: - Group verbal and visual presentation on the components of exercise prescription. Introduces F.I.T.T principle from ACSM for exercise prescriptions.  Reviews F.I.T.T. principles of aerobic exercise including progression. Written material given at graduation.   Education: Resistance Exercise: - Group verbal and visual presentation on the components of exercise prescription. Introduces  F.I.T.T principle from ACSM for exercise prescriptions  Reviews F.I.T.T. principles of resistance exercise including progression. Written material given at graduation.    Education: Exercise & Equipment Safety: - Individual verbal instruction and demonstration of equipment use and safety with use of the equipment. Flowsheet Row Cardiac Rehab from 12/08/2022 in [facility] Cardiac and Pulmonary Rehab  Date 11/24/22  Educator [facility]  Instruction Review Code 1- Verbalizes Understanding       Education: Exercise Physiology & General Exercise Guidelines: - Group verbal and written instruction with models to  review the exercise physiology of the cardiovascular system and associated critical values. Provides general exercise guidelines with specific guidelines to those with heart or lung disease.    Education: Flexibility, Balance, Mind/Body Relaxation: - Group verbal and visual presentation with interactive activity on the components of exercise prescription. Introduces F.I.T.T principle from ACSM for exercise prescriptions. Reviews F.I.T.T. principles of flexibility and balance exercise training including progression. Also discusses the mind body connection.  Reviews various relaxation techniques to help reduce and manage stress (i.e. Deep breathing, progressive muscle relaxation, and visualization). Balance handout provided to take home. Written material given at graduation.   Activity Barriers & Risk Stratification:  Activity Barriers & Cardiac Risk Stratification - 11/24/22 1145       Activity Barriers & Cardiac Risk Stratification   Activity Barriers Joint Problems;Back Problems    Cardiac Risk Stratification High             6 Minute Walk:  6 Minute Walk     Row Name 11/24/22 1143         6 Minute Walk   Phase Initial     Distance 1370 feet     Walk Time 6 minutes     # of Rest Breaks 0     MPH 2.59     METS 3.35     RPE 7     Perceived Dyspnea  0     VO2 Peak 11.72     Symptoms No     Resting HR 84 bpm     Resting BP 112/62     Resting Oxygen Saturation  95 %     Exercise Oxygen Saturation  during 6 min walk 97 %     Max Ex. HR 105 bpm     Max Ex. BP 136/70     2 Minute Post BP 106/62              Oxygen Initial Assessment:   Oxygen Re-Evaluation:   Oxygen Discharge (Final Oxygen Re-Evaluation):   Initial Exercise Prescription:  Initial Exercise Prescription - 11/24/22 1100       Date of Initial Exercise RX and Referring Provider   Date 11/24/22    Referring Provider End      Oxygen   Maintain Oxygen Saturation 88% or higher      Treadmill    MPH 3    Grade 1    Minutes 15    METs 3.71      Recumbant Bike   Level 2    RPM 50    Minutes 15    METs 3.35      Arm Ergometer   Level 1    RPM 30    Minutes 15    METs 3.35      REL-XR   Level 2    Speed 50    Minutes 15    METs 3.35      Track   Laps 42  Minutes 15    METs 3.25      Prescription Details   Frequency (times per week) 3    Duration Progress to 30 minutes of continuous aerobic without signs/symptoms of physical distress      Intensity   THRR 40-80% of Max Heartrate 112-140    Ratings of Perceived Exertion 11-13    Perceived Dyspnea 0-4      Progression   Progression Continue to progress workloads to maintain intensity without signs/symptoms of physical distress.      Resistance Training   Training Prescription Yes    Weight 3    Reps 10-15             Perform Capillary Blood Glucose checks as needed.  Exercise Prescription Changes:   Exercise Prescription Changes     Row Name 11/24/22 1100 11/30/22 1300 12/13/22 1100 12/20/22 1600 12/27/22 1400     Response to Exercise   Blood Pressure (Admit) 112/62 102/60 126/68 -- 102/60   Blood Pressure (Exercise) 136/70 142/62 128/58 -- 142/68   Blood Pressure (Exit) 106/62 112/60 122/60 -- 112/60   Heart Rate (Admit) 84 bpm 82 bpm 77 bpm -- 70 bpm   Heart Rate (Exercise) 105 bpm 126 bpm 133 bpm -- 135 bpm   Heart Rate (Exit) 85 bpm 102 bpm 90 bpm -- 87 bpm   Oxygen Saturation (Admit) 95 % -- -- -- --   Oxygen Saturation (Exercise) 97 % -- -- -- --   Oxygen Saturation (Exit) 97 % -- -- -- --   Rating of Perceived Exertion (Exercise) 7 12 13 $ -- 12   Perceived Dyspnea (Exercise) 0 -- -- -- --   Symptoms none none none -- none   Comments 6 MWT results First full day of exercise -- -- --   Duration -- Progress to 30 minutes of  aerobic without signs/symptoms of physical distress Continue with 30 min of aerobic exercise without signs/symptoms of physical distress. -- Continue with 30 min  of aerobic exercise without signs/symptoms of physical distress.   Intensity -- THRR unchanged THRR unchanged -- THRR unchanged     Progression   Progression -- Continue to progress workloads to maintain intensity without signs/symptoms of physical distress. Continue to progress workloads to maintain intensity without signs/symptoms of physical distress. -- Continue to progress workloads to maintain intensity without signs/symptoms of physical distress.   Average METs -- 3.56 3.93 -- 3.99     Resistance Training   Training Prescription -- Yes Yes -- Yes   Weight -- 3 3 lb -- 3 lb   Reps -- 10-15 10-15 -- 10-15     Interval Training   Interval Training -- No No -- No     Treadmill   MPH -- 3 3 -- 3.2   Grade -- 1 1 -- 1   Minutes -- 15 15 -- 15   METs -- 3.71 3.71 -- 3.89     Recumbant Bike   Level -- -- 2 -- 3   Watts -- -- 30 -- 25   Minutes -- -- 15 -- 15   METs -- -- 3.45 -- 3.19     REL-XR   Level -- 2 2 -- 2   Minutes -- 15 15 -- 15   METs -- 3.4 4.6 -- 5     Home Exercise Plan   Plans to continue exercise at -- -- -- Home (comment)  walking, seated exercise machine Home (comment)  walking, seated exercise machine  Frequency -- -- -- Add 2 additional days to program exercise sessions. Add 2 additional days to program exercise sessions.   Initial Home Exercises Provided -- -- -- 12/20/22 12/20/22     Oxygen   Maintain Oxygen Saturation -- 88% or higher 88% or higher 88% or higher 88% or higher    Row Name 01/10/23 1100 01/24/23 1300           Response to Exercise   Blood Pressure (Admit) 124/72 118/60      Blood Pressure (Exercise) 128/62 --      Blood Pressure (Exit) 112/62 120/62      Heart Rate (Admit) 80 bpm 78 bpm      Heart Rate (Exercise) 122 bpm 129 bpm      Heart Rate (Exit) 90 bpm 84 bpm      Rating of Perceived Exertion (Exercise) 13 13      Symptoms none none      Duration Continue with 30 min of aerobic exercise without signs/symptoms of  physical distress. Continue with 30 min of aerobic exercise without signs/symptoms of physical distress.      Intensity THRR unchanged THRR unchanged        Progression   Progression Continue to progress workloads to maintain intensity without signs/symptoms of physical distress. Continue to progress workloads to maintain intensity without signs/symptoms of physical distress.      Average METs 4.48 4.41        Resistance Training   Training Prescription Yes Yes      Weight 3 lb 3 lb      Reps 10-15 10-15        Interval Training   Interval Training No No        Treadmill   MPH 3.5 3.5      Grade 1 1      Minutes 15 15      METs 4.16 4.16        Recumbant Bike   Level 3 3      Watts 30 32      Minutes 15 15      METs 3.42 --        NuStep   Level -- 3      Minutes -- 15      METs -- 3.2        REL-XR   Level 3 3      Minutes 15 15      METs 4.8 5.8        Home Exercise Plan   Plans to continue exercise at Home (comment)  walking, seated exercise machine Home (comment)  walking, seated exercise machine      Frequency Add 2 additional days to program exercise sessions. Add 2 additional days to program exercise sessions.      Initial Home Exercises Provided 12/20/22 12/20/22        Oxygen   Maintain Oxygen Saturation 88% or higher 88% or higher               Exercise Comments:   Exercise Comments     Row Name 11/25/22 1558           Exercise Comments First full day of exercise!  Patient was oriented to gym and equipment including functions, settings, policies, and procedures.  Patient's individual exercise prescription and treatment plan were reviewed.  All starting workloads were established based on the results of the 6 minute walk test done at initial orientation visit.  The  plan for exercise progression was also introduced and progression will be customized based on patient's performance and goals.                Exercise Goals and Review:    Exercise Goals     Row Name 11/24/22 1149             Exercise Goals   Increase Physical Activity Yes       Intervention Provide advice, education, support and counseling about physical activity/exercise needs.;Develop an individualized exercise prescription for aerobic and resistive training based on initial evaluation findings, risk stratification, comorbidities and participant's personal goals.       Expected Outcomes Short Term: Attend rehab on a regular basis to increase amount of physical activity.;Long Term: Add in home exercise to make exercise part of routine and to increase amount of physical activity.;Long Term: Exercising regularly at least 3-5 days a week.       Increase Strength and Stamina Yes       Intervention Provide advice, education, support and counseling about physical activity/exercise needs.;Develop an individualized exercise prescription for aerobic and resistive training based on initial evaluation findings, risk stratification, comorbidities and participant's personal goals.       Expected Outcomes Short Term: Increase workloads from initial exercise prescription for resistance, speed, and METs.;Short Term: Perform resistance training exercises routinely during rehab and add in resistance training at home;Long Term: Improve cardiorespiratory fitness, muscular endurance and strength as measured by increased METs and functional capacity (6MWT)       Able to understand and use rate of perceived exertion (RPE) scale Yes       Intervention Provide education and explanation on how to use RPE scale       Expected Outcomes Short Term: Able to use RPE daily in rehab to express subjective intensity level;Long Term:  Able to use RPE to guide intensity level when exercising independently       Able to understand and use Dyspnea scale Yes       Intervention Provide education and explanation on how to use Dyspnea scale       Expected Outcomes Long Term: Able to use Dyspnea scale to  guide intensity level when exercising independently;Short Term: Able to use Dyspnea scale daily in rehab to express subjective sense of shortness of breath during exertion       Knowledge and understanding of Target Heart Rate Range (THRR) Yes       Intervention Provide education and explanation of THRR including how the numbers were predicted and where they are located for reference       Expected Outcomes Short Term: Able to state/look up THRR;Long Term: Able to use THRR to govern intensity when exercising independently;Short Term: Able to use daily as guideline for intensity in rehab       Able to check pulse independently Yes       Intervention Provide education and demonstration on how to check pulse in carotid and radial arteries.;Review the importance of being able to check your own pulse for safety during independent exercise       Expected Outcomes Short Term: Able to explain why pulse checking is important during independent exercise       Understanding of Exercise Prescription Yes       Intervention Provide education, explanation, and written materials on patient's individual exercise prescription       Expected Outcomes Short Term: Able to explain program exercise prescription;Long Term: Able to explain home  exercise prescription to exercise independently                Exercise Goals Re-Evaluation :  Exercise Goals Re-Evaluation     Row Name 11/25/22 1600 11/30/22 1343 12/13/22 1128 12/20/22 1637 12/27/22 1447     Exercise Goal Re-Evaluation   Exercise Goals Review Increase Physical Activity;Able to understand and use rate of perceived exertion (RPE) scale;Knowledge and understanding of Target Heart Rate Range (THRR);Understanding of Exercise Prescription;Increase Strength and Stamina;Able to check pulse independently Increase Physical Activity;Understanding of Exercise Prescription;Increase Strength and Stamina Increase Physical Activity;Understanding of Exercise  Prescription;Increase Strength and Stamina Increase Physical Activity;Understanding of Exercise Prescription;Increase Strength and Stamina;Able to understand and use rate of perceived exertion (RPE) scale;Able to check pulse independently;Knowledge and understanding of Target Heart Rate Range (THRR);Able to understand and use Dyspnea scale Increase Physical Activity;Increase Strength and Stamina;Understanding of Exercise Prescription   Comments Reviewed RPE scale, THR and program prescription with pt today.  Pt voiced understanding and was given a copy of goals to take home. Tammie Smith is off to a good start in rehab. She did well during her first session in rehab. She has an average MET level of 3.56 METs on the treadmill and XR. She did well with both machines at her initial exercise prescription. We will continue to monitor her progress in the program. Tammie Smith continues to do well in rehab. She has continued to work on her inital exercise prescription workloads and would benefit from starting to increase. Staff will remind patient to start moving her levels up. She is hitting her THR each session and RPEs are appropriate. Will continue to monitor. Reviewed home exercise with pt today.  Pt plans to walk and use her seated exercise machine for exercise. She does not know the name of it but states it is whole body where it utilizes both her arms and legs.  Reviewed THR, pulse, RPE, sign and symptoms, pulse oximetery and when to call 911 or MD.  Also discussed weather considerations and indoor options.  Pt voiced understanding. Tammie Smith continues to do well in rehab. She recently increased her overall average MET level to 3.99 METs. She also improved to level 3 on the recumbent bike. She increased her speed on the treadmill to 3.2 mph while maintaining an incline of 1% as well. We will continue to monitor her progress.   Expected Outcomes Short: Use RPE daily to regulate intensity.  Long: Follow program prescription in THR.  Short: Continue to follow current exercise prescription. Long: Continue to improve strength and stamina. Short: Start to increase workloads on all equipment (XR and RB to level 3) Long: Continue to increase overall MET level Short: Start watching HR during exercise Long: Continue to exercise independently at home Short: Continue to increase treadmill workload, incline. Long: Continue to improve strength and stamina.    [place] Name 01/10/23 1129 01/10/23 1605 01/24/23 1348         Exercise Goal Re-Evaluation   Exercise Goals Review Increase Physical Activity;Increase Strength and Stamina;Understanding of Exercise Prescription Increase Physical Activity;Increase Strength and Stamina;Understanding of Exercise Prescription Increase Physical Activity;Increase Strength and Stamina;Understanding of Exercise Prescription     Comments Tammie Smith continues to do well in rehab. She increased on her treadmill speed to a 3.5 mph. She also increased to level 3 on the XR and is now working up to 30 watts on the recumbent bike! She continues to reach her THR. She would definititely benefit from increasing to 4 lbs for handweights.  We will continue to monitor. Tammie Smith is doing well in rehab.  She continues to do her walk with her dog in the mornings and then goes for a walk in the afternoon as well. She does 1.3 miles daily up a hill one way and back down to ease back home.  She has learned her limits and feels that her stamina is recovering some. Tammie Smith continues to do well in rehab. She has consistently worked at a MET level above 4 METs. She has also stayed consistent with her treadmill workload at a speed of 3.5 mph and an incline of 1%. She has done well with level 3 on the XR, recumbent bike, and T4 Nustep as well. We will continue to monitor her progress in the program.     Expected Outcomes Short: Increase to 4 lb handweights Long: Continue to increase overall MET level and stamina short; Conitnue to walk daily and buld  stamina Long: continue to improve strength and stamina overall Short: Continue to increase workloads. Long: Continue to improve strength and stamina.              Discharge Exercise Prescription (Final Exercise Prescription Changes):  Exercise Prescription Changes - 01/24/23 1300       Response to Exercise   Blood Pressure (Admit) 118/60    Blood Pressure (Exit) 120/62    Heart Rate (Admit) 78 bpm    Heart Rate (Exercise) 129 bpm    Heart Rate (Exit) 84 bpm    Rating of Perceived Exertion (Exercise) 13    Symptoms none    Duration Continue with 30 min of aerobic exercise without signs/symptoms of physical distress.    Intensity THRR unchanged      Progression   Progression Continue to progress workloads to maintain intensity without signs/symptoms of physical distress.    Average METs 4.41      Resistance Training   Training Prescription Yes    Weight 3 lb    Reps 10-15      Interval Training   Interval Training No      Treadmill   MPH 3.5    Grade 1    Minutes 15    METs 4.16      Recumbant Bike   Level 3    Watts 32    Minutes 15      NuStep   Level 3    Minutes 15    METs 3.2      REL-XR   Level 3    Minutes 15    METs 5.8      Home Exercise Plan   Plans to continue exercise at Home (comment)   walking, seated exercise machine   Frequency Add 2 additional days to program exercise sessions.    Initial Home Exercises Provided 12/20/22      Oxygen   Maintain Oxygen Saturation 88% or higher             Nutrition:  Target Goals: Understanding of nutrition guidelines, daily intake of sodium <1580m, cholesterol <2069m calories 30% from fat and 7% or less from saturated fats, daily to have 5 or more servings of fruits and vegetables.  Education: All About Nutrition: -Group instruction provided by verbal, written material, interactive activities, discussions, models, and posters to present general guidelines for heart healthy nutrition including  fat, fiber, MyPlate, the role of sodium in heart healthy nutrition, utilization of the nutrition label, and utilization of this knowledge for meal planning. Follow up email sent as  well. Written material given at graduation.   Biometrics:  Pre Biometrics - 11/24/22 1150       Pre Biometrics   Height 5' 3.25" (1.607 m)    Weight 146 lb (66.2 kg)    Waist Circumference 35 inches    Hip Circumference 40 inches    Waist to Hip Ratio 0.88 %    BMI (Calculated) 25.64    Single Leg Stand 3.44 seconds              Nutrition Therapy Plan and Nutrition Goals:  Nutrition Therapy & Goals - 11/24/22 1114       Nutrition Therapy   Diet Heart healthy, low Na, T2DM    Drug/Food Interactions Statins/Certain Fruits    Protein (specify units) 55-65g    Fiber 25 grams    Whole Grain Foods 3 servings    Saturated Fats 12 max. grams    Fruits and Vegetables 8 servings/day    Sodium 2 grams      Personal Nutrition Goals   Nutrition Goal ST: review handouts LT: follow MyPlate guidelines, maintain A1C <7    Comments 67 y.o. F admitted to cardiac rehab s/p NSTEMI. PMHx includes HLD, DJD, T2DM, CAD, skin cancer (basal cell). Relevant medications reviewed. PYP Score: 65. Vegetables & Fruits 8/12. Breads, Grains & Cereals 8/12. Red & Processed Meat 10/12. Poultry 2/2. Fish & Shellfish 0/4. Beans, Nuts & Seeds 0/4. Milk & Dairy Foods 4/6. Toppings, Oils, Seasonings & Salt 16/20. Sweets, Snacks & Restaurant Food 7/14. Beverages 10/10. Tammie Smith reports that she is enjoying more bland food and she does not have much of an appetite. She reports getting back her normal eating pattern as her husband did not enjoy many heart healthy foods. B: 2 eggs and spinach frittata with rye toast and small amount of butter, hot oatbran cereal with blueberries.  L: example salad with baked chicken or [place] sandwich (arnolds whole grain bread with nuts in it). She likes to treat herself to [employer] as well. D: sometimes does not  eat. bowl of chicken noodle soup and bagel last night. She usually has sometime light. She reports her bigger meals are breakfast and lunch. Drinks: water. She uses olive oil most of the time. Tammie Smith reports cooking with some salt, but does not add any more. Discussed heart healthy eating and T2DM MNT.      Intervention Plan   Intervention Prescribe, educate and counsel regarding individualized specific dietary modifications aiming towards targeted core components such as weight, hypertension, lipid management, diabetes, heart failure and other comorbidities.;Nutrition handout(s) given to patient.    Expected Outcomes Short Term Goal: Understand basic principles of dietary content, such as calories, fat, sodium, cholesterol and nutrients.;Short Term Goal: A plan has been developed with personal nutrition goals set during dietitian appointment.;Long Term Goal: Adherence to prescribed nutrition plan.             Nutrition Assessments:  MEDIFICTS Score Key: ?70 Need to make dietary changes  40-70 Heart Healthy Diet ? 40 Therapeutic Level Cholesterol Diet  Flowsheet Row Cardiac Rehab from 11/23/2022 in [facility] Cardiac and Pulmonary Rehab  Picture Your Plate Total Score on Admission 65      Picture Your Plate Scores: [id number] Unhealthy dietary pattern with much room for improvement. 41-50 Dietary pattern unlikely to meet recommendations for good health and room for improvement. 51-60 More healthful dietary pattern, with some room for improvement.  >60 Healthy dietary pattern, although there may be some specific behaviors that could  be improved.    Nutrition Goals Re-Evaluation:  Nutrition Goals Re-Evaluation     [place] Name 12/20/22 1619 01/10/23 1609           Goals   Nutrition Goal ST: review handouts LT: follow MyPlate guidelines, maintain A1C <7 Short: Continue to increase more vegetable intake Long: Continue to eat heart healthy diet      Comment Tammie Smith states she has been eating better  overall. She has increased more vegetables by substituting her chips for "veggie" chips. She seasons them to taste as close as possible but enjoys doing it. Lately, she has been eating hot oatbrand cereal and adds mixed fresh berries to it for breakfast. Her recent  A1C was 6.9% and hopes to keep it below 7%. Tammie Smith is doing well in rehab. She continues to work on her diet.  She is trying to get in more vegetables.  She does not eat a lot of meat so she tries not to stress over it. If she has meat, it's usually chicken or [place].  She does like shrimp on occasion as well.  She does not check her sugar, but has not been feeling any swings and tries to keep her diet balanced.      Expected Outcome Short: Continue to increase more vegetable intake Long: Continue to eat heart healthy diet Short; Continue to add in variety of fruit and vegetables Long: continue to focus on heart healthy eating               Nutrition Goals Discharge (Final Nutrition Goals Re-Evaluation):  Nutrition Goals Re-Evaluation - 01/10/23 1609       Goals   Nutrition Goal Short: Continue to increase more vegetable intake Long: Continue to eat heart healthy diet    Comment Tammie Smith is doing well in rehab. She continues to work on her diet.  She is trying to get in more vegetables.  She does not eat a lot of meat so she tries not to stress over it. If she has meat, it's usually chicken or [place].  She does like shrimp on occasion as well.  She does not check her sugar, but has not been feeling any swings and tries to keep her diet balanced.    Expected Outcome Short; Continue to add in variety of fruit and vegetables Long: continue to focus on heart healthy eating             Psychosocial: Target Goals: Acknowledge presence or absence of significant depression and/or stress, maximize coping skills, provide positive support system. Participant is able to verbalize types and ability to use techniques and skills needed for reducing  stress and depression.   Education: Stress, Anxiety, and Depression - Group verbal and visual presentation to define topics covered.  Reviews how body is impacted by stress, anxiety, and depression.  Also discusses healthy ways to reduce stress and to treat/manage anxiety and depression.  Written material given at graduation. Flowsheet Row Cardiac Rehab from 12/08/2022 in [facility] Cardiac and Pulmonary Rehab  Date 12/08/22  Educator KW  Instruction Review Code 1- [employer] Understanding       Education: Sleep Hygiene -Provides group verbal and written instruction about how sleep can affect your health.  Define sleep hygiene, discuss sleep cycles and impact of sleep habits. Review good sleep hygiene tips.    Initial Review & Psychosocial Screening:  Initial Psych Review & Screening - 11/23/22 1008       Initial Review   Current issues with Current  Stress Concerns;Current Depression    Source of Stress Concerns Family    Comments husband recently passed after a 2 year battle with esophagus cancer      Family Dynamics   Good Support System? Yes   three children     Barriers   Psychosocial barriers to participate in program There are no identifiable barriers or psychosocial needs.;The patient should benefit from training in stress management and relaxation.      Screening Interventions   Interventions Encouraged to exercise;Provide feedback about the scores to participant;To provide support and resources with identified psychosocial needs    Expected Outcomes Short Term goal: Utilizing psychosocial counselor, staff and physician to assist with identification of specific Stressors or current issues interfering with healing process. Setting desired goal for each stressor or current issue identified.;Long Term Goal: Stressors or current issues are controlled or eliminated.;Short Term goal: Identification and review with participant of any Quality of Life or Depression concerns found by scoring  the questionnaire.;Long Term goal: The participant improves quality of Life and PHQ9 Scores as seen by post scores and/or verbalization of changes             Quality of Life Scores:   Quality of Life - 11/23/22 1031       Quality of Life   Select Quality of Life      Quality of Life Scores   Health/Function Pre 27.17 %    Socioeconomic Pre 23.94 %    Psych/Spiritual Pre 27.57 %    Family Pre 27 %    GLOBAL Pre 26.49 %            Scores of 19 and below usually indicate a poorer quality of life in these areas.  A difference of  2-3 points is a clinically meaningful difference.  A difference of 2-3 points in the total score of the Quality of Life Index has been associated with significant improvement in overall quality of life, self-image, physical symptoms, and general health in studies assessing change in quality of life.  PHQ-9: Review Flowsheet       12/20/2022 11/24/2022  Depression screen PHQ 2/9  Decreased Interest 3 3  Down, Depressed, Hopeless 2 2  PHQ - 2 Score 5 5  Altered sleeping 2 2  Tired, decreased energy 3 3  Change in appetite 2 3  Feeling bad or failure about yourself  1 0  Trouble concentrating 1 1  Moving slowly or fidgety/restless 0 0  Suicidal thoughts 0 0  PHQ-9 Score 14 14  Difficult doing work/chores Very difficult Very difficult   Interpretation of Total Score  Total Score Depression Severity:  1-4 = Minimal depression, 5-9 = Mild depression, 10-14 = Moderate depression, 15-19 = Moderately severe depression, 20-27 = Severe depression   Psychosocial Evaluation and Intervention:  Psychosocial Evaluation - 11/23/22 1026       Psychosocial Evaluation & Interventions   Interventions Encouraged to exercise with the program and follow exercise prescription;Stress management education;Relaxation education    Comments Tammie Smith is coming to cardiac rehab after stents that were done at two separate procedures. Her first stent was in September and  was the week after her husband passed away after battling 2 years with esophagus cancer. She was his main caretaker and mentioned her whole schedule was around his needs. So now she is trying to find a "new normal." She is still working and has her 3 children as her main support system. Her heart trouble started right after  the passing of her husband so she is tired and ready for a new routine where she can feel better. Her sleep has suffered for a while now, where she is up every few hours. She thinks that might have been because she had to be up giving him pain medicine and such, but now her body just can't rest longer than about 4 hours. She is hoping this program will help boost her stamina and increase her knowledge on heart healthy living.    Expected Outcomes Short: attend cardiac rehab for education and exercise. Long: develop and maintain positive self care habits.    Continue Psychosocial Services  Follow up required by staff             Psychosocial Re-Evaluation:  Psychosocial Re-Evaluation     [place] Name 12/20/22 1618 01/10/23 1607           Psychosocial Re-Evaluation   Current issues with Current Stress Concerns;Current Depression Current Stress Concerns;Current Depression      Comments Tammie Smith has been going through a tough time recently due to her dog. He had to get surgery which was already high risk and is in the process of recovering which is very stressful on her right now. She is still waiting to hear back if its cancer. She shared this dog with her husband who recently passed which is why this is emotional for her. She said she felt a little more positive today as the dog seemed more spry so she has high hopes he is feeling better. She says coming to rehab and exercising at a higher workloads helps her relieve her stress. She takes Cymbalta for depression and feels it does help her. She has great support from her kids. Tammie Smith is doing well in rehab.  She is doing well mentally.   She has an answer to her dog and has come to terms with his cancer diagnosis and is prepared to manage it.  She is sleeping pretty well. She is still taking the Cymbalta  but not sure if it working.  She as going to come off of it before finding out about her dog, so she decided to stay on it.      Expected Outcomes Short: Enjoy time with dog, continue coming to rehab for stress management Long: Continue to maintain positive attitude and stay compliant with medication short; continue to enjoy time with dog Long: Continue to use cymbalta to help cope with dog      Interventions Encouraged to attend Cardiac Rehabilitation for the exercise Encouraged to attend Cardiac Rehabilitation for the exercise      Continue Psychosocial Services  Follow up required by staff Follow up required by staff               Psychosocial Discharge (Final Psychosocial Re-Evaluation):  Psychosocial Re-Evaluation - 01/10/23 1607       Psychosocial Re-Evaluation   Current issues with Current Stress Concerns;Current Depression    Comments Tammie Smith is doing well in rehab.  She is doing well mentally.  She has an answer to her dog and has come to terms with his cancer diagnosis and is prepared to manage it.  She is sleeping pretty well. She is still taking the Cymbalta  but not sure if it working.  She as going to come off of it before finding out about her dog, so she decided to stay on it.    Expected Outcomes short; continue to enjoy time with dog Long: Continue to  use cymbalta to help cope with dog    Interventions Encouraged to attend Cardiac Rehabilitation for the exercise    Continue Psychosocial Services  Follow up required by staff             Vocational Rehabilitation: Provide vocational rehab assistance to qualifying candidates.   Vocational Rehab Evaluation & Intervention:  Vocational Rehab - 11/23/22 1008       Initial Vocational Rehab Evaluation & Intervention   Assessment shows need for Vocational  Rehabilitation No             Education: Education Goals: Education classes will be provided on a variety of topics geared toward better understanding of heart health and risk factor modification. Participant will state understanding/return demonstration of topics presented as noted by education test scores.  Learning Barriers/Preferences:  Learning Barriers/Preferences - 11/23/22 1008       Learning Barriers/Preferences   Learning Barriers None    Learning Preferences None             General Cardiac Education Topics:  AED/CPR: - Group verbal and written instruction with the use of models to demonstrate the basic use of the AED with the basic ABC's of resuscitation.   Anatomy and Cardiac Procedures: - Group verbal and visual presentation and models provide information about basic cardiac anatomy and function. Reviews the testing methods done to diagnose heart disease and the outcomes of the test results. Describes the treatment choices: Medical Management, Angioplasty, or Coronary Bypass Surgery for treating various heart conditions including Myocardial Infarction, Angina, Valve Disease, and Cardiac Arrhythmias.  Written material given at graduation. Flowsheet Row Cardiac Rehab from 12/08/2022 in [facility] Cardiac and Pulmonary Rehab  Education need identified 11/24/22       Medication Safety: - Group verbal and visual instruction to review commonly prescribed medications for heart and lung disease. Reviews the medication, class of the drug, and side effects. Includes the steps to properly store meds and maintain the prescription regimen.  Written material given at graduation.   Intimacy: - Group verbal instruction through game format to discuss how heart and lung disease can affect sexual intimacy. Written material given at graduation..   Know Your Numbers and Heart Failure: - Group verbal and visual instruction to discuss disease risk factors for cardiac and pulmonary  disease and treatment options.  Reviews associated critical values for Overweight/Obesity, Hypertension, Cholesterol, and Diabetes.  Discusses basics of heart failure: signs/symptoms and treatments.  Introduces Heart Failure Zone chart for action plan for heart failure.  Written material given at graduation.   Infection Prevention: - Provides verbal and written material to individual with discussion of infection control including proper hand washing and proper equipment cleaning during exercise session. Flowsheet Row Cardiac Rehab from 12/08/2022 in [facility] Cardiac and Pulmonary Rehab  Date 11/24/22  Educator [facility]  Instruction Review Code 1- Verbalizes Understanding       Falls Prevention: - Provides verbal and written material to individual with discussion of falls prevention and safety. Flowsheet Row Cardiac Rehab from 12/08/2022 in [facility] Cardiac and Pulmonary Rehab  Date 11/24/22  Educator [facility]  Instruction Review Code 1- Verbalizes Understanding       Other: -Provides group and verbal instruction on various topics (see comments)   Knowledge Questionnaire Score:  Knowledge Questionnaire Score - 11/23/22 1031       Knowledge Questionnaire Score   Pre Score 25/26             Core Components/Risk Factors/Patient Goals at Admission:  Personal Goals and Risk Factors at Admission - 11/24/22 1151       Core Components/Risk Factors/Patient Goals on Admission    Weight Management Yes    Intervention Weight Management: Develop a combined nutrition and exercise program designed to reach desired caloric intake, while maintaining appropriate intake of nutrient and fiber, sodium and fats, and appropriate energy expenditure required for the weight goal.;Weight Management: Provide education and appropriate resources to help participant work on and attain dietary goals.    Admit Weight 146 lb (66.2 kg)    Goal Weight: Short Term 146 lb (66.2 kg)    Goal Weight: Long Term 146 lb (66.2 kg)     Expected Outcomes Short Term: Continue to assess and modify interventions until short term weight is achieved;Long Term: Adherence to nutrition and physical activity/exercise program aimed toward attainment of established weight goal;Weight Maintenance: Understanding of the daily nutrition guidelines, which includes 25-35% calories from fat, 7% or less cal from saturated fats, less than 227m cholesterol, less than 1.5gm of sodium, & 5 or more servings of fruits and vegetables daily;Understanding recommendations for meals to include 15-35% energy as protein, 25-35% energy from fat, 35-60% energy from carbohydrates, less than 2028mof dietary cholesterol, 20-35 gm of total fiber daily;Understanding of distribution of calorie intake throughout the day with the consumption of 4-5 meals/snacks    Diabetes Yes    Intervention Provide education about signs/symptoms and action to take for hypo/hyperglycemia.;Provide education about proper nutrition, including hydration, and aerobic/resistive exercise prescription along with prescribed medications to achieve blood glucose in normal ranges: Fasting glucose 65-99 mg/dL    Expected Outcomes Short Term: Participant verbalizes understanding of the signs/symptoms and immediate care of hyper/hypoglycemia, proper foot care and importance of medication, aerobic/resistive exercise and nutrition plan for blood glucose control.;Long Term: Attainment of HbA1C < 7%.    Hypertension Yes    Intervention Provide education on lifestyle modifcations including regular physical activity/exercise, weight management, moderate sodium restriction and increased consumption of fresh fruit, vegetables, and low fat dairy, alcohol moderation, and smoking cessation.;Monitor prescription use compliance.    Expected Outcomes Short Term: Continued assessment and intervention until BP is < 140/9077mG in hypertensive participants. < 130/80m71m in hypertensive participants with diabetes, heart failure  or chronic kidney disease.;Long Term: Maintenance of blood pressure at goal levels.    Lipids Yes    Intervention Provide education and support for participant on nutrition & aerobic/resistive exercise along with prescribed medications to achieve LDL <70mg22mL >40mg.64mExpected Outcomes Short Term: Participant states understanding of desired cholesterol values and is compliant with medications prescribed. Participant is following exercise prescription and nutrition guidelines.;Long Term: Cholesterol controlled with medications as prescribed, with individualized exercise RX and with personalized nutrition plan. Value goals: LDL < 70mg, 79m> 40 mg.             Education:Diabetes - Individual verbal and written instruction to review signs/symptoms of diabetes, desired ranges of glucose level fasting, after meals and with exercise. Acknowledge that pre and post exercise glucose checks will be done for 3 sessions at entry of program. [facility]  Date 11/24/22  Educator [facility] Review Code 1- Verbalizes Understanding       Core Components/Risk Factors/Patient Goals Review:   Goals and Risk Factor Review     Row Name 12/20/22 1621 01/10/23 1612           Core Components/Risk  Factors/Patient Goals Review   Personal Goals Review Weight Management/Obesity;Diabetes;Hypertension Weight Management/Obesity;Diabetes;Hypertension      Review Tammie Smith states her A1C stays below 7%, last check in September it was 6.9%. She states she was never told she needed to check her sugars and was no resources to do so. Encouraged her to reach out to her doctor to make sure what she should be doing with her sugars. She takes Metformin for DM.  She generally doesnt eat a lot of high sugar foods or other food that would spike her sugar. She does not check weight but does not have any goals associated with weight. She does want to maintain but  is not interested in taking any other action. She is taking all medicaitons as prescribed with no issues. Tammie Smith is doing well in rehab.  She has not had any swings with her sugar, unless she eats a donut.  Her weight is staying steady.  Her pressures are doing well and she does check those at home.  She does not have any medicaiton problems and feels pretty well managed.      Expected Outcomes Short: Check with doctor on blood sugars Long: Continue to manage lifestyle risk factors long-term short: Continue to maintain weight lOng: continue to monitor risk factors.               Core Components/Risk Factors/Patient Goals at Discharge (Final Review):   Goals and Risk Factor Review - 01/10/23 1612       Core Components/Risk Factors/Patient Goals Review   Personal Goals Review Weight Management/Obesity;Diabetes;Hypertension    Review Tammie Smith is doing well in rehab.  She has not had any swings with her sugar, unless she eats a donut.  Her weight is staying steady.  Her pressures are doing well and she does check those at home.  She does not have any medicaiton problems and feels pretty well managed.    Expected Outcomes short: Continue to maintain weight lOng: continue to monitor risk factors.             ITP Comments:  ITP Comments     Row Name 11/23/22 1026 11/24/22 1143 11/25/22 1558 12/01/22 1032 12/29/22 0952   ITP Comments Initial phone call completed. Diagnosis can be found in [facility] 9/11. EP Orientation scheduled for Wednesday 12/20 at 1:30. Completed 6MWT and gym orientation. Initial ITP created and sent for review to Dr. Emily Filbert, Medical Director. First full day of exercise!  Patient was oriented to gym and equipment including functions, settings, policies, and procedures.  Patient's individual exercise prescription and treatment plan were reviewed.  All starting workloads were established based on the results of the 6 minute walk test done at initial orientation visit.  The plan for  exercise progression was also introduced and progression will be customized based on patient's performance and goals. 30 Day review completed. Medical Director ITP review done, changes made as directed, and signed approval by Medical Director.     new to program 30 Day review completed. Medical Director ITP review done, changes made as directed, and signed approval by Medical Director.    [place] Name 01/26/23 1420           ITP Comments 30 day review completed. ITP sent to Dr. Emily Filbert, Medical Director of Cardiac Rehab. Continue with ITP unless changes are made by physician.                Comments: 30 day review

## 2023-01-31 ENCOUNTER — Encounter: Payer: Managed Care, Other (non HMO) | Admitting: *Deleted

## 2023-01-31 DIAGNOSIS — Z955 Presence of coronary angioplasty implant and graft: Secondary | ICD-10-CM | POA: Diagnosis not present

## 2023-01-31 DIAGNOSIS — I214 Non-ST elevation (NSTEMI) myocardial infarction: Secondary | ICD-10-CM

## 2023-01-31 NOTE — Progress Notes (Signed)
Daily Session Note  Patient Details  Name: Tammie Smith MRN: [id number] Date of Birth: Aug 04, 1956 Referring Provider:   Flowsheet Row Cardiac Rehab from 11/24/2022 in [facility] Cardiac and Pulmonary Rehab  Referring Provider End       Encounter Date: 01/31/2023  Check In:  Session Check In - 01/31/23 1605       Check-In   Supervising physician immediately available to respond to emergencies See telemetry face sheet for immediately available ER MD    Location [facility]-Cardiac & Pulmonary Rehab    Staff Present Renita Papa, RN BSN;Joseph Tessie Fass, RCP,RRT,BSRT;Noah Ridgway, [place], Exercise Physiologist    Virtual Visit No    Medication changes reported     No    Fall or balance concerns reported    No    Warm-up and Cool-down Performed on first and last piece of equipment    Resistance Training Performed Yes    VAD Patient? No    PAD/SET Patient? No      Pain Assessment   Currently in Pain? No/denies                Social History   Tobacco Use  Smoking Status Never  Smokeless Tobacco Never    Goals Met:  Independence with exercise equipment Exercise tolerated well No report of concerns or symptoms today Strength training completed today  Goals Unmet:  Not Applicable  Comments: Pt able to follow exercise prescription today without complaint.  Will continue to monitor for progression.    Dr. Emily Filbert is Medical Director for [place].  Dr. Ottie Glazier is Medical Director for [facility] Pulmonary Rehabilitation.

## 2023-02-02 ENCOUNTER — Encounter: Payer: Managed Care, Other (non HMO) | Admitting: *Deleted

## 2023-02-02 DIAGNOSIS — Z955 Presence of coronary angioplasty implant and graft: Secondary | ICD-10-CM

## 2023-02-02 DIAGNOSIS — I214 Non-ST elevation (NSTEMI) myocardial infarction: Secondary | ICD-10-CM

## 2023-02-02 NOTE — Progress Notes (Signed)
Daily Session Note  Patient Details  Name: Tammie Smith MRN: [id number] Date of Birth: June 24, 1956 Referring Provider:   Flowsheet Row Cardiac Rehab from 11/24/2022 in [facility] Cardiac and Pulmonary Rehab  Referring Provider End       Encounter Date: 02/02/2023  Check In:  Session Check In - 02/02/23 1630       Check-In   Supervising physician immediately available to respond to emergencies See telemetry face sheet for immediately available ER MD    Location [facility]-Cardiac & Pulmonary Rehab    Staff Present Darlyne Russian, RN, Doyce Para, BS, ACSM CEP, Exercise Physiologist;Joseph Tessie Fass, [place]    Virtual Visit No    Medication changes reported     No    Fall or balance concerns reported    No    Warm-up and Cool-down Performed on first and last piece of equipment    Resistance Training Performed Yes    VAD Patient? No    PAD/SET Patient? No      Pain Assessment   Currently in Pain? No/denies                Social History   Tobacco Use  Smoking Status Never  Smokeless Tobacco Never    Goals Met:  Independence with exercise equipment Exercise tolerated well No report of concerns or symptoms today Strength training completed today  Goals Unmet:  Not Applicable  Comments: Pt able to follow exercise prescription today without complaint.  Will continue to monitor for progression.    Dr. Emily Filbert is Medical Director for [place].  Dr. Ottie Glazier is Medical Director for [facility] Pulmonary Rehabilitation.

## 2023-02-09 ENCOUNTER — Ambulatory Visit: Payer: Managed Care, Other (non HMO) | Admitting: Medical

## 2023-02-09 ENCOUNTER — Encounter: Payer: Managed Care, Other (non HMO) | Attending: Internal Medicine | Admitting: *Deleted

## 2023-02-09 VITALS — Ht 63.25 in | Wt 145.4 lb

## 2023-02-09 DIAGNOSIS — Z48812 Encounter for surgical aftercare following surgery on the circulatory system: Secondary | ICD-10-CM | POA: Diagnosis not present

## 2023-02-09 DIAGNOSIS — I252 Old myocardial infarction: Secondary | ICD-10-CM | POA: Diagnosis not present

## 2023-02-09 DIAGNOSIS — I214 Non-ST elevation (NSTEMI) myocardial infarction: Secondary | ICD-10-CM | POA: Diagnosis present

## 2023-02-09 DIAGNOSIS — Z955 Presence of coronary angioplasty implant and graft: Secondary | ICD-10-CM | POA: Insufficient documentation

## 2023-02-09 NOTE — Progress Notes (Signed)
Daily Session Note  Patient Details  Name: Tammie Smith MRN: [id number] Date of Birth: November 11, 1956 Referring Provider:   Flowsheet Row Cardiac Rehab from 11/24/2022 in [facility] Cardiac and Pulmonary Rehab  Referring Provider End       Encounter Date: 02/09/2023  Check In:  Session Check In - 02/09/23 1608       Check-In   Supervising physician immediately available to respond to emergencies See telemetry face sheet for immediately available ER MD    Location [facility]-Cardiac & Pulmonary Rehab    Staff Present Renita Papa, RN Odelia Gage, RN, Doyce Para, BS, ACSM CEP, Exercise Physiologist    Virtual Visit No    Medication changes reported     No    Fall or balance concerns reported    No    Warm-up and Cool-down Performed on first and last piece of equipment    Resistance Training Performed Yes    VAD Patient? No    PAD/SET Patient? No      Pain Assessment   Currently in Pain? No/denies                Social History   Tobacco Use  Smoking Status Never  Smokeless Tobacco Never    Goals Met:  Independence with exercise equipment Exercise tolerated well No report of concerns or symptoms today Strength training completed today  Goals Unmet:  Not Applicable  Comments: Pt able to follow exercise prescription today without complaint.  Will continue to monitor for progression.    Dr. Emily Filbert is Medical Director for [place].  Dr. Ottie Glazier is Medical Director for [facility] Pulmonary Rehabilitation.

## 2023-02-09 NOTE — Patient Instructions (Signed)
Discharge Patient Instructions  Patient Details  Name: Tammie Smith MRN: [id number] Date of Birth: [birth year] Referring Provider:  Kirk Ruths, MD   Number of Visits: 36  Reason for Discharge:  Patient reached a stable level of exercise. Patient independent in their exercise. Patient has met program and personal goals.  Diagnosis:  NSTEMI (non-ST elevated myocardial infarction) Tammie Smith)  Status post coronary artery stent placement  Initial Exercise Prescription:  Initial Exercise Prescription - 11/24/22 1100       Date of Initial Exercise RX and Referring Provider   Date 11/24/22    Referring Provider End      Oxygen   Maintain Oxygen Saturation 88% or higher      Treadmill   MPH 3    Grade 1    Minutes 15    METs 3.71      Recumbant Bike   Level 2    RPM 50    Minutes 15    METs 3.35      Arm Ergometer   Level 1    RPM 30    Minutes 15    METs 3.35      REL-XR   Level 2    Speed 50    Minutes 15    METs 3.35      Track   Laps 42    Minutes 15    METs 3.25      Prescription Details   Frequency (times per week) 3    Duration Progress to 30 minutes of continuous aerobic without signs/symptoms of physical distress      Intensity   THRR 40-80% of Max Heartrate 112-140    Ratings of Perceived Exertion 11-13    Perceived Dyspnea 0-4      Progression   Progression Continue to progress workloads to maintain intensity without signs/symptoms of physical distress.      Resistance Training   Training Prescription Yes    Weight 3    Reps 10-15             Discharge Exercise Prescription (Final Exercise Prescription Changes):  Exercise Prescription Changes - 02/07/23 1100       Response to Exercise   Blood Pressure (Admit) 104/60    Blood Pressure (Exit) 112/82    Heart Rate (Admit) 72 bpm    Heart Rate (Exercise) 125 bpm    Heart Rate (Exit) 88 bpm    Rating of Perceived Exertion (Exercise) 12    Symptoms none    Duration  Continue with 30 min of aerobic exercise without signs/symptoms of physical distress.    Intensity THRR unchanged      Progression   Progression Continue to progress workloads to maintain intensity without signs/symptoms of physical distress.    Average METs 4.55      Resistance Training   Training Prescription Yes    Weight 3 lb    Reps 10-15      Interval Training   Interval Training No      Treadmill   MPH 3.5    Grade 1    Minutes 15    METs 4.16      REL-XR   Level 3    Minutes 15    METs 6      Home Exercise Plan   Plans to continue exercise at Home (comment)   walking, seated exercise machine   Frequency Add 2 additional days to program exercise sessions.    Initial Home Exercises Provided 12/20/22  Oxygen   Maintain Oxygen Saturation 88% or higher             Functional Capacity:  6 Minute Walk     Row Name 11/24/22 1143 02/09/23 1615       6 Minute Walk   Phase Initial Discharge    Distance 1370 feet 1845 feet    Distance % Change -- 34.7 %    Distance Feet Change -- 475 ft    Walk Time 6 minutes 6 minutes    # of Rest Breaks 0 0    MPH 2.59 3.49    METS 3.35 4.33    RPE 7 11    Perceived Dyspnea  0 0    VO2 Peak 11.72 15.17    Symptoms No No    Resting HR 84 bpm 78 bpm    Resting BP 112/62 112/72    Resting Oxygen Saturation  95 % 97 %    Exercise Oxygen Saturation  during 6 min walk 97 % 96 %    Max Ex. HR 105 bpm 120 bpm    Max Ex. BP 136/70 136/68    2 Minute Post BP 106/62 --            Nutrition & Weight - Outcomes:  Pre Biometrics - 11/24/22 1150       Pre Biometrics   Height 5' 3.25" (1.607 m)    Weight 146 lb (66.2 kg)    Waist Circumference 35 inches    Hip Circumference 40 inches    Waist to Hip Ratio 0.88 %    BMI (Calculated) 25.64    Single Leg Stand 3.44 seconds             Post Biometrics - 02/09/23 1620        Post  Biometrics   Height 5' 3.25" (1.607 m)    Weight 145 lb 6.4 oz (66 kg)    BMI  (Calculated) 25.54    Single Leg Stand 13.5 seconds             Nutrition:  Nutrition Therapy & Goals - 11/24/22 1114       Nutrition Therapy   Diet Heart healthy, low Na, T2DM    Drug/Food Interactions Statins/Certain Fruits    Protein (specify units) 55-65g    Fiber 25 grams    Whole Grain Foods 3 servings    Saturated Fats 12 max. grams    Fruits and Vegetables 8 servings/day    Sodium 2 grams      Personal Nutrition Goals   Nutrition Goal ST: review handouts LT: follow MyPlate guidelines, maintain A1C <7    Comments 67 y.o. F admitted to cardiac rehab s/p NSTEMI. PMHx includes HLD, DJD, T2DM, CAD, skin cancer (basal cell). Relevant medications reviewed. PYP Score: 65. Vegetables & Fruits 8/12. Breads, Grains & Cereals 8/12. Red & Processed Meat 10/12. Poultry 2/2. Fish & Shellfish 0/4. Beans, Nuts & Seeds 0/4. Milk & Dairy Foods 4/6. Toppings, Oils, Seasonings & Salt 16/20. Sweets, Snacks & Restaurant Food 7/14. Beverages 10/10. Tammie Smith reports that she is enjoying more bland food and she does not have much of an appetite. She reports getting back her normal eating pattern as her husband did not enjoy many heart healthy foods. B: 2 eggs and spinach frittata with rye toast and small amount of butter, hot oatbran cereal with blueberries.  L: example salad with baked chicken or [place] sandwich (arnolds whole grain bread with nuts in it). She  likes to treat herself to panera as well. D: sometimes does not eat. bowl of chicken noodle soup and bagel last night. She usually has sometime light. She reports her bigger meals are breakfast and lunch. Drinks: water. She uses olive oil most of the time. Tammie Smith reports cooking with some salt, but does not add any more. Discussed heart healthy eating and T2DM MNT.      Intervention Plan   Intervention Prescribe, educate and counsel regarding individualized specific dietary modifications aiming towards targeted core components such as weight,  hypertension, lipid management, diabetes, heart failure and other comorbidities.;Nutrition handout(s) given to patient.    Expected Outcomes Short Term Goal: Understand basic principles of dietary content, such as calories, fat, sodium, cholesterol and nutrients.;Short Term Goal: A plan has been developed with personal nutrition goals set during dietitian appointment.;Long Term Goal: Adherence to prescribed nutrition plan.

## 2023-02-14 ENCOUNTER — Encounter: Payer: Managed Care, Other (non HMO) | Admitting: *Deleted

## 2023-02-14 DIAGNOSIS — I214 Non-ST elevation (NSTEMI) myocardial infarction: Secondary | ICD-10-CM

## 2023-02-14 DIAGNOSIS — I252 Old myocardial infarction: Secondary | ICD-10-CM | POA: Diagnosis not present

## 2023-02-14 DIAGNOSIS — Z955 Presence of coronary angioplasty implant and graft: Secondary | ICD-10-CM

## 2023-02-14 NOTE — Progress Notes (Signed)
Daily Session Note  Patient Details  Name: Tammie Smith MRN: [id number] Date of Birth: June 24, 1956 Referring Provider:   Flowsheet Row Cardiac Rehab from 11/24/2022 in [facility] Cardiac and Pulmonary Rehab  Referring Provider End       Encounter Date: 02/14/2023  Check In:  Session Check In - 02/14/23 1710       Check-In   Supervising physician immediately available to respond to emergencies See telemetry face sheet for immediately available ER MD    Location [facility]-Cardiac & Pulmonary Rehab    Staff Present Justin Mend, RCP,RRT,BSRT;Noah Tickle, BS, Exercise Physiologist;Ferlin [place] Tamala Julian, RN, ADN    Virtual Visit No    Medication changes reported     No    Fall or balance concerns reported    No    Warm-up and Cool-down Performed on first and last piece of equipment    Resistance Training Performed Yes    VAD Patient? No    PAD/SET Patient? No      Pain Assessment   Currently in Pain? No/denies                Social History   Tobacco Use  Smoking Status Never  Smokeless Tobacco Never    Goals Met:  Independence with exercise equipment Exercise tolerated well No report of concerns or symptoms today Strength training completed today  Goals Unmet:  Not Applicable  Comments: Pt able to follow exercise prescription today without complaint.  Will continue to monitor for progression.    Dr. Emily Filbert is Medical Director for [place].  Dr. Ottie Glazier is Medical Director for [facility] Pulmonary Rehabilitation.

## 2023-02-16 ENCOUNTER — Encounter: Payer: Managed Care, Other (non HMO) | Admitting: *Deleted

## 2023-02-16 DIAGNOSIS — Z955 Presence of coronary angioplasty implant and graft: Secondary | ICD-10-CM

## 2023-02-16 DIAGNOSIS — I252 Old myocardial infarction: Secondary | ICD-10-CM | POA: Diagnosis not present

## 2023-02-16 DIAGNOSIS — I214 Non-ST elevation (NSTEMI) myocardial infarction: Secondary | ICD-10-CM

## 2023-02-16 NOTE — Progress Notes (Signed)
Daily Session Note  Patient Details  Name: Tammie Smith MRN: [id number] Date of Birth: 03-09-1956 Referring Provider:   Flowsheet Row Cardiac Rehab from 11/24/2022 in [facility] Cardiac and Pulmonary Rehab  Referring Provider End       Encounter Date: 02/16/2023  Check In:  Session Check In - 02/16/23 [name]       Check-In   Supervising physician immediately available to respond to emergencies See telemetry face sheet for immediately available ER MD    Location [facility]-Cardiac & Pulmonary Rehab    Staff Present Renita Papa, RN BSN;Megan Tamala Julian, RN, Terie Purser, RCP,RRT,BSRT    Virtual Visit No    Medication changes reported     No    Fall or balance concerns reported    No    Warm-up and Cool-down Performed on first and last piece of equipment    Resistance Training Performed Yes    VAD Patient? No    PAD/SET Patient? No      Pain Assessment   Currently in Pain? No/denies                Social History   Tobacco Use  Smoking Status Never  Smokeless Tobacco Never    Goals Met:  Independence with exercise equipment Exercise tolerated well No report of concerns or symptoms today Strength training completed today  Goals Unmet:  Not Applicable  Comments: Pt able to follow exercise prescription today without complaint.  Will continue to monitor for progression.    Dr. Emily Filbert is Medical Director for [place].  Dr. Ottie Glazier is Medical Director for [facility] Pulmonary Rehabilitation.

## 2023-02-21 ENCOUNTER — Encounter: Payer: Managed Care, Other (non HMO) | Admitting: *Deleted

## 2023-02-21 DIAGNOSIS — Z955 Presence of coronary angioplasty implant and graft: Secondary | ICD-10-CM

## 2023-02-21 DIAGNOSIS — I214 Non-ST elevation (NSTEMI) myocardial infarction: Secondary | ICD-10-CM

## 2023-02-21 DIAGNOSIS — I252 Old myocardial infarction: Secondary | ICD-10-CM | POA: Diagnosis not present

## 2023-02-21 NOTE — Progress Notes (Signed)
Daily Session Note  Patient Details  Name: Tammie Smith MRN: [id number] Date of Birth: [birth year] Referring Provider:   Flowsheet Row Cardiac Rehab from 11/24/2022 in [facility] Cardiac and Pulmonary Rehab  Referring Provider End       Encounter Date: 02/21/2023  Check In:  Session Check In - 02/21/23 1617       Check-In   Supervising physician immediately available to respond to emergencies See telemetry face sheet for immediately available ER MD    Location [facility]-Cardiac & Pulmonary Rehab    Staff Present Renita Papa, RN BSN;Joseph Tessie Fass, RCP,RRT,BSRT;Noah [place], [place], Exercise Physiologist    Virtual Visit No    Medication changes reported     No    Fall or balance concerns reported    No    Warm-up and Cool-down Performed on first and last piece of equipment    Resistance Training Performed Yes    VAD Patient? No    PAD/SET Patient? No      Pain Assessment   Currently in Pain? No/denies                Social History   Tobacco Use  Smoking Status Never  Smokeless Tobacco Never    Goals Met:  Independence with exercise equipment Exercise tolerated well Strength training completed today  Goals Unmet:  Not Applicable  Comments: Pt able to follow exercise prescription today without complaint.  Will continue to monitor for progression.    Dr. Emily Filbert is Medical Director for [name].  Dr. Ottie Glazier is Medical Director for [facility] Pulmonary Rehabilitation.

## 2023-02-23 ENCOUNTER — Encounter: Payer: Self-pay | Admitting: *Deleted

## 2023-02-23 ENCOUNTER — Encounter: Payer: Managed Care, Other (non HMO) | Admitting: *Deleted

## 2023-02-23 DIAGNOSIS — Z955 Presence of coronary angioplasty implant and graft: Secondary | ICD-10-CM

## 2023-02-23 DIAGNOSIS — I214 Non-ST elevation (NSTEMI) myocardial infarction: Secondary | ICD-10-CM

## 2023-02-23 DIAGNOSIS — I252 Old myocardial infarction: Secondary | ICD-10-CM | POA: Diagnosis not present

## 2023-02-23 NOTE — Progress Notes (Signed)
Cardiac Individual Treatment Plan  Patient Details  Name: Tammie Smith MRN: [id number] Date of Birth: [birth year] Referring Provider:   Flowsheet Row Cardiac Rehab from 11/24/2022 in [facility] Cardiac and Pulmonary Rehab  Referring Provider End       Initial Encounter Date:  Flowsheet Row Cardiac Rehab from 11/24/2022 in [facility] Cardiac and Pulmonary Rehab  Date 11/24/22       Visit Diagnosis: NSTEMI (non-ST elevated myocardial infarction) [facility])  Status post coronary artery stent placement  Patient's Home Medications on Admission:  Current Outpatient Medications:    aspirin EC 81 MG tablet, Take 1 tablet (81 mg total) by mouth daily. Swallow whole., Disp: 90 tablet, Rfl: 3   atorvastatin (LIPITOR) 80 MG tablet, Take 1 tablet (80 mg total) by mouth daily., Disp: 90 tablet, Rfl: 3   DULoxetine (CYMBALTA) 30 MG capsule, Take 30 mg by mouth in the morning., Disp: , Rfl:    isosorbide mononitrate (IMDUR) 30 MG 24 hr tablet, Take 0.5 tablets (15 mg total) by mouth daily., Disp: 45 tablet, Rfl: 3   metFORMIN (GLUCOPHAGE-XR) 500 MG 24 hr tablet, Take 1 tablet (500 mg total) by mouth daily with supper., Disp: , Rfl:    nitroGLYCERIN (NITROSTAT) 0.4 MG SL tablet, Place 1 tablet (0.4 mg total) under the tongue every 5 (five) minutes as needed for chest pain., Disp: 90 tablet, Rfl: 3   propranolol (INDERAL) 40 MG tablet, Take 40 mg by mouth 2 (two) times daily., Disp: , Rfl:    RABEprazole (ACIPHEX) 20 MG tablet, Take 20 mg by mouth at bedtime., Disp: , Rfl:    ticagrelor (BRILINTA) 90 MG TABS tablet, Take 1 tablet (90 mg total) by mouth 2 (two) times daily., Disp: 180 tablet, Rfl: 3  Past Medical History: Past Medical History:  Diagnosis Date   Borderline glaucoma    Cancer ([place])    skin cancer basal cell   Coronary artery disease 08/16/2022   NSTEMI s/p PCI to OM1   Degenerative disc disease, lumbar    Gestational diabetes    Hyperlipemia    Right-sided carotid artery disease (HCC)     echo done    Tobacco Use: Social History   Tobacco Use  Smoking Status Never  Smokeless Tobacco Never    Labs: Review Flowsheet       Latest Ref Rng & Units 08/16/2022 08/18/2022 10/21/2022  Labs for ITP Cardiac and Pulmonary Rehab  Cholestrol 100 - 199 mg/dL - 248  181   LDL (calc) 0 - 99 mg/dL - 167  113   HDL-C >39 mg/dL - 37  41   Trlycerides 0 - 149 mg/dL - 219  155   Hemoglobin A1c 4.8 - 5.6 % 6.9  - -     Exercise Target Goals: Exercise Program Goal: Individual exercise prescription set using results from initial 6 min walk test and THRR while considering  patient's activity barriers and safety.   Exercise Prescription Goal: Initial exercise prescription builds to 30-45 minutes a day of aerobic activity, 2-3 days per week.  Home exercise guidelines will be given to patient during program as part of exercise prescription that the participant will acknowledge.   Education: Aerobic Exercise: - Group verbal and visual presentation on the components of exercise prescription. Introduces F.I.T.T principle from ACSM for exercise prescriptions.  Reviews F.I.T.T. principles of aerobic exercise including progression. Written material given at graduation.   Education: Resistance Exercise: - Group verbal and visual presentation on the components of exercise prescription. Introduces  F.I.T.T principle from ACSM for exercise prescriptions  Reviews F.I.T.T. principles of resistance exercise including progression. Written material given at graduation.    Education: Exercise & Equipment Safety: - Individual verbal instruction and demonstration of equipment use and safety with use of the equipment. Flowsheet Row Cardiac Rehab from 12/08/2022 in [facility] Cardiac and Pulmonary Rehab  Date 11/24/22  Educator [facility]  Instruction Review Code 1- Verbalizes Understanding       Education: Exercise Physiology & General Exercise Guidelines: - Group verbal and written instruction with models to  review the exercise physiology of the cardiovascular system and associated critical values. Provides general exercise guidelines with specific guidelines to those with heart or lung disease.    Education: Flexibility, Balance, Mind/Body Relaxation: - Group verbal and visual presentation with interactive activity on the components of exercise prescription. Introduces F.I.T.T principle from ACSM for exercise prescriptions. Reviews F.I.T.T. principles of flexibility and balance exercise training including progression. Also discusses the mind body connection.  Reviews various relaxation techniques to help reduce and manage stress (i.e. Deep breathing, progressive muscle relaxation, and visualization). Balance handout provided to take home. Written material given at graduation.   Activity Barriers & Risk Stratification:  Activity Barriers & Cardiac Risk Stratification - 11/24/22 1145       Activity Barriers & Cardiac Risk Stratification   Activity Barriers Joint Problems;Back Problems    Cardiac Risk Stratification High             6 Minute Walk:  6 Minute Walk     Row Name 11/24/22 1143 02/09/23 1615       6 Minute Walk   Phase Initial Discharge    Distance 1370 feet 1845 feet    Distance % Change -- 34.7 %    Distance Feet Change -- 475 ft    Walk Time 6 minutes 6 minutes    # of Rest Breaks 0 0    MPH 2.59 3.49    METS 3.35 4.33    RPE 7 11    Perceived Dyspnea  0 0    VO2 Peak 11.72 15.17    Symptoms No No    Resting HR 84 bpm 78 bpm    Resting BP 112/62 112/72    Resting Oxygen Saturation  95 % 97 %    Exercise Oxygen Saturation  during 6 min walk 97 % 96 %    Max Ex. HR 105 bpm 120 bpm    Max Ex. BP 136/70 136/68    2 Minute Post BP 106/62 --             Oxygen Initial Assessment:   Oxygen Re-Evaluation:   Oxygen Discharge (Final Oxygen Re-Evaluation):   Initial Exercise Prescription:  Initial Exercise Prescription - 11/24/22 1100       Date of  Initial Exercise RX and Referring Provider   Date 11/24/22    Referring Provider End      Oxygen   Maintain Oxygen Saturation 88% or higher      Treadmill   MPH 3    Grade 1    Minutes 15    METs 3.71      Recumbant Bike   Level 2    RPM 50    Minutes 15    METs 3.35      Arm Ergometer   Level 1    RPM 30    Minutes 15    METs 3.35      REL-XR   Level 2  Speed 50    Minutes 15    METs 3.35      Track   Laps 42    Minutes 15    METs 3.25      Prescription Details   Frequency (times per week) 3    Duration Progress to 30 minutes of continuous aerobic without signs/symptoms of physical distress      Intensity   THRR 40-80% of Max Heartrate 112-140    Ratings of Perceived Exertion 11-13    Perceived Dyspnea 0-4      Progression   Progression Continue to progress workloads to maintain intensity without signs/symptoms of physical distress.      Resistance Training   Training Prescription Yes    Weight 3    Reps 10-15             Perform Capillary Blood Glucose checks as needed.  Exercise Prescription Changes:   Exercise Prescription Changes     Row Name 11/24/22 1100 11/30/22 1300 12/13/22 1100 12/20/22 1600 12/27/22 1400     Response to Exercise   Blood Pressure (Admit) 112/62 102/60 126/68 -- 102/60   Blood Pressure (Exercise) 136/70 142/62 128/58 -- 142/68   Blood Pressure (Exit) 106/62 112/60 122/60 -- 112/60   Heart Rate (Admit) 84 bpm 82 bpm 77 bpm -- 70 bpm   Heart Rate (Exercise) 105 bpm 126 bpm 133 bpm -- 135 bpm   Heart Rate (Exit) 85 bpm 102 bpm 90 bpm -- 87 bpm   Oxygen Saturation (Admit) 95 % -- -- -- --   Oxygen Saturation (Exercise) 97 % -- -- -- --   Oxygen Saturation (Exit) 97 % -- -- -- --   Rating of Perceived Exertion (Exercise) 7 12 13  -- 12   Perceived Dyspnea (Exercise) 0 -- -- -- --   Symptoms none none none -- none   Comments 6 MWT results First full day of exercise -- -- --   Duration -- Progress to 30 minutes of   aerobic without signs/symptoms of physical distress Continue with 30 min of aerobic exercise without signs/symptoms of physical distress. -- Continue with 30 min of aerobic exercise without signs/symptoms of physical distress.   Intensity -- THRR unchanged THRR unchanged -- THRR unchanged     Progression   Progression -- Continue to progress workloads to maintain intensity without signs/symptoms of physical distress. Continue to progress workloads to maintain intensity without signs/symptoms of physical distress. -- Continue to progress workloads to maintain intensity without signs/symptoms of physical distress.   Average METs -- 3.56 3.93 -- 3.99     Resistance Training   Training Prescription -- Yes Yes -- Yes   Weight -- 3 3 lb -- 3 lb   Reps -- 10-15 10-15 -- 10-15     Interval Training   Interval Training -- No No -- No     Treadmill   MPH -- 3 3 -- 3.2   Grade -- 1 1 -- 1   Minutes -- 15 15 -- 15   METs -- 3.71 3.71 -- 3.89     Recumbant Bike   Level -- -- 2 -- 3   Watts -- -- 30 -- 25   Minutes -- -- 15 -- 15   METs -- -- 3.45 -- 3.19     REL-XR   Level -- 2 2 -- 2   Minutes -- 15 15 -- 15   METs -- 3.4 4.6 -- 5     Home Exercise Plan  Plans to continue exercise at -- -- -- Home (comment)  walking, seated exercise machine Home (comment)  walking, seated exercise machine   Frequency -- -- -- Add 2 additional days to program exercise sessions. Add 2 additional days to program exercise sessions.   Initial Home Exercises Provided -- -- -- 12/20/22 12/20/22     Oxygen   Maintain Oxygen Saturation -- 88% or higher 88% or higher 88% or higher 88% or higher    Row Name 01/10/23 1100 01/24/23 1300 02/07/23 1100 02/21/23 1400       Response to Exercise   Blood Pressure (Admit) 124/72 118/60 104/60 102/60    Blood Pressure (Exercise) 128/62 -- -- --    Blood Pressure (Exit) 112/62 120/62 112/82 104/60    Heart Rate (Admit) 80 bpm 78 bpm 72 bpm 84 bpm    Heart Rate  (Exercise) 122 bpm 129 bpm 125 bpm 125 bpm    Heart Rate (Exit) 90 bpm 84 bpm 88 bpm 91 bpm    Rating of Perceived Exertion (Exercise) 13 13 12 12     Symptoms none none none none    Duration Continue with 30 min of aerobic exercise without signs/symptoms of physical distress. Continue with 30 min of aerobic exercise without signs/symptoms of physical distress. Continue with 30 min of aerobic exercise without signs/symptoms of physical distress. Continue with 30 min of aerobic exercise without signs/symptoms of physical distress.    Intensity THRR unchanged THRR unchanged THRR unchanged THRR unchanged      Progression   Progression Continue to progress workloads to maintain intensity without signs/symptoms of physical distress. Continue to progress workloads to maintain intensity without signs/symptoms of physical distress. Continue to progress workloads to maintain intensity without signs/symptoms of physical distress. Continue to progress workloads to maintain intensity without signs/symptoms of physical distress.    Average METs 4.48 4.41 4.55 4.54      Resistance Training   Training Prescription Yes Yes Yes Yes    Weight 3 lb 3 lb 3 lb 3 lb    Reps 10-15 10-15 10-15 10-15      Interval Training   Interval Training No No No No      Treadmill   MPH 3.5 3.5 3.5 3.5    Grade 1 1 1 1     Minutes 15 15 15 15     METs 4.16 4.16 4.16 4.16      Recumbant Bike   Level 3 3 -- --    Watts 30 32 -- --    Minutes 15 15 -- --    METs 3.42 -- -- --      NuStep   Level -- 3 -- --    Minutes -- 15 -- --    METs -- 3.2 -- --      REL-XR   Level 3 3 3 3     Minutes 15 15 15 15     METs 4.8 5.8 6 6.1      Home Exercise Plan   Plans to continue exercise at Home (comment)  walking, seated exercise machine Home (comment)  walking, seated exercise machine Home (comment)  walking, seated exercise machine Home (comment)  walking, seated exercise machine    Frequency Add 2 additional days to program  exercise sessions. Add 2 additional days to program exercise sessions. Add 2 additional days to program exercise sessions. Add 2 additional days to program exercise sessions.    Initial Home Exercises Provided 12/20/22 12/20/22 12/20/22 12/20/22  Oxygen   Maintain Oxygen Saturation 88% or higher 88% or higher 88% or higher 88% or higher             Exercise Comments:   Exercise Comments     Row Name 11/25/22 1558           Exercise Comments First full day of exercise!  Patient was oriented to gym and equipment including functions, settings, policies, and procedures.  Patient's individual exercise prescription and treatment plan were reviewed.  All starting workloads were established based on the results of the 6 minute walk test done at initial orientation visit.  The plan for exercise progression was also introduced and progression will be customized based on patient's performance and goals.                Exercise Goals and Review:   Exercise Goals     Row Name 11/24/22 1149             Exercise Goals   Increase Physical Activity Yes       Intervention Provide advice, education, support and counseling about physical activity/exercise needs.;Develop an individualized exercise prescription for aerobic and resistive training based on initial evaluation findings, risk stratification, comorbidities and participant's personal goals.       Expected Outcomes Short Term: Attend rehab on a regular basis to increase amount of physical activity.;Long Term: Add in home exercise to make exercise part of routine and to increase amount of physical activity.;Long Term: Exercising regularly at least 3-5 days a week.       Increase Strength and Stamina Yes       Intervention Provide advice, education, support and counseling about physical activity/exercise needs.;Develop an individualized exercise prescription for aerobic and resistive training based on initial evaluation findings,  risk stratification, comorbidities and participant's personal goals.       Expected Outcomes Short Term: Increase workloads from initial exercise prescription for resistance, speed, and METs.;Short Term: Perform resistance training exercises routinely during rehab and add in resistance training at home;Long Term: Improve cardiorespiratory fitness, muscular endurance and strength as measured by increased METs and functional capacity (6MWT)       Able to understand and use rate of perceived exertion (RPE) scale Yes       Intervention Provide education and explanation on how to use RPE scale       Expected Outcomes Short Term: Able to use RPE daily in rehab to express subjective intensity level;Long Term:  Able to use RPE to guide intensity level when exercising independently       Able to understand and use Dyspnea scale Yes       Intervention Provide education and explanation on how to use Dyspnea scale       Expected Outcomes Long Term: Able to use Dyspnea scale to guide intensity level when exercising independently;Short Term: Able to use Dyspnea scale daily in rehab to express subjective sense of shortness of breath during exertion       Knowledge and understanding of Target Heart Rate Range (THRR) Yes       Intervention Provide education and explanation of THRR including how the numbers were predicted and where they are located for reference       Expected Outcomes Short Term: Able to state/look up THRR;Long Term: Able to use THRR to govern intensity when exercising independently;Short Term: Able to use daily as guideline for intensity in rehab       Able to check pulse independently Yes  Intervention Provide education and demonstration on how to check pulse in carotid and radial arteries.;Review the importance of being able to check your own pulse for safety during independent exercise       Expected Outcomes Short Term: Able to explain why pulse checking is important during independent  exercise       Understanding of Exercise Prescription Yes       Intervention Provide education, explanation, and written materials on patient's individual exercise prescription       Expected Outcomes Short Term: Able to explain program exercise prescription;Long Term: Able to explain home exercise prescription to exercise independently                Exercise Goals Re-Evaluation :  Exercise Goals Re-Evaluation     Row Name 11/25/22 1600 11/30/22 1343 12/13/22 1128 12/20/22 1637 12/27/22 1447     Exercise Goal Re-Evaluation   Exercise Goals Review Increase Physical Activity;Able to understand and use rate of perceived exertion (RPE) scale;Knowledge and understanding of Target Heart Rate Range (THRR);Understanding of Exercise Prescription;Increase Strength and Stamina;Able to check pulse independently Increase Physical Activity;Understanding of Exercise Prescription;Increase Strength and Stamina Increase Physical Activity;Understanding of Exercise Prescription;Increase Strength and Stamina Increase Physical Activity;Understanding of Exercise Prescription;Increase Strength and Stamina;Able to understand and use rate of perceived exertion (RPE) scale;Able to check pulse independently;Knowledge and understanding of Target Heart Rate Range (THRR);Able to understand and use Dyspnea scale Increase Physical Activity;Increase Strength and Stamina;Understanding of Exercise Prescription   Comments Reviewed RPE scale, THR and program prescription with pt today.  Pt voiced understanding and was given a copy of goals to take home. Suzonne is off to a good start in rehab. She did well during her first session in rehab. She has an average MET level of 3.56 METs on the treadmill and XR. She did well with both machines at her initial exercise prescription. We will continue to monitor her progress in the program. Dominika continues to do well in rehab. She has continued to work on her inital exercise prescription  workloads and would benefit from starting to increase. Staff will remind patient to start moving her levels up. She is hitting her THR each session and RPEs are appropriate. Will continue to monitor. Reviewed home exercise with pt today.  Pt plans to walk and use her seated exercise machine for exercise. She does not know the name of it but states it is whole body where it utilizes both her arms and legs.  Reviewed THR, pulse, RPE, sign and symptoms, pulse oximetery and when to call 911 or MD.  Also discussed weather considerations and indoor options.  Pt voiced understanding. Judean continues to do well in rehab. She recently increased her overall average MET level to 3.99 METs. She also improved to level 3 on the recumbent bike. She increased her speed on the treadmill to 3.2 mph while maintaining an incline of 1% as well. We will continue to monitor her progress.   Expected Outcomes Short: Use RPE daily to regulate intensity.  Long: Follow program prescription in THR. Short: Continue to follow current exercise prescription. Long: Continue to improve strength and stamina. Short: Start to increase workloads on all equipment (XR and RB to level 3) Long: Continue to increase overall MET level Short: Start watching HR during exercise Long: Continue to exercise independently at home Short: Continue to increase treadmill workload, incline. Long: Continue to improve strength and stamina.    [place] Name 01/10/23 1129 01/10/23 1605 01/24/23 1348 02/07/23 1110  02/16/23 1612     Exercise Goal Re-Evaluation   Exercise Goals Review Increase Physical Activity;Increase Strength and Stamina;Understanding of Exercise Prescription Increase Physical Activity;Increase Strength and Stamina;Understanding of Exercise Prescription Increase Physical Activity;Increase Strength and Stamina;Understanding of Exercise Prescription Increase Physical Activity;Increase Strength and Stamina;Understanding of Exercise Prescription Increase  Physical Activity;Increase Strength and Stamina;Understanding of Exercise Prescription   Comments Aretzy continues to do well in rehab. She increased on her treadmill speed to a 3.5 mph. She also increased to level 3 on the XR and is now working up to 30 watts on the recumbent bike! She continues to reach her THR. She would definititely benefit from increasing to 4 lbs for handweights. We will continue to monitor. Keina is doing well in rehab.  She continues to do her walk with her dog in the mornings and then goes for a walk in the afternoon as well. She does 1.3 miles daily up a hill one way and back down to ease back home.  She has learned her limits and feels that her stamina is recovering some. [place] continues to do well in rehab. She has consistently worked at a MET level above 4 METs. She has also stayed consistent with her treadmill workload at a speed of 3.5 mph and an incline of 1%. She has done well with level 3 on the XR, recumbent bike, and T4 Nustep as well. We will continue to monitor her progress in the program. Larice continues to do well in rehab. She has stayed consistent at her treadmill workload and should be encouraged to increase it. She has also been consistent at level 3 on the XR and could benefit from going to level 4. She did work up to 6 METS on this last session! She continues to hit her THR. She is due for her post 6MWT and we hope to see improvement. Lakedia is getting ready to graduate in the next couple of weeks. She also improved on her post 6MWT by 34%!  She walks several days per week for about 1.3 miles with some incline outside. It usually takes her about 30 minutes at a time. She can tell she is getting quicker. She got a Fitbit for Christmas and has been checking her HR. We reviewed THR and she knows its usually within her range. She will continue to walk at home after she graduates.   Expected Outcomes Short: Increase to 4 lb handweights Long: Continue to increase overall MET  level and stamina short; Conitnue to walk daily and buld stamina Long: continue to improve strength and stamina overall Short: Continue to increase workloads. Long: Continue to improve strength and stamina. Short: Improve on post 6MWT Long: Continue to increase overall MET level and stamina Short: Graduate Long: Continue to exercise independently at appropriate prescription    Row Name 02/21/23 1425             Exercise Goal Re-Evaluation   Exercise Goals Review Increase Physical Activity;Increase Strength and Stamina;Understanding of Exercise Prescription       Comments Ajala is doing well in the program and is close to graduating. She recently completed her post 6MWT and improved by 34.7%! She also has continued to walk the treadmill at a speed of 3.5 mph with an incline of 1%. She also has been consistent on the XR at level 3. We will continue to monitor her progress until she graduates from the program.       Expected Outcomes Short: Graduate. Long: Continue to exercise  independently.                Discharge Exercise Prescription (Final Exercise Prescription Changes):  Exercise Prescription Changes - 02/21/23 1400       Response to Exercise   Blood Pressure (Admit) 102/60    Blood Pressure (Exit) 104/60    Heart Rate (Admit) 84 bpm    Heart Rate (Exercise) 125 bpm    Heart Rate (Exit) 91 bpm    Rating of Perceived Exertion (Exercise) 12    Symptoms none    Duration Continue with 30 min of aerobic exercise without signs/symptoms of physical distress.    Intensity THRR unchanged      Progression   Progression Continue to progress workloads to maintain intensity without signs/symptoms of physical distress.    Average METs 4.54      Resistance Training   Training Prescription Yes    Weight 3 lb    Reps 10-15      Interval Training   Interval Training No      Treadmill   MPH 3.5    Grade 1    Minutes 15    METs 4.16      REL-XR   Level 3    Minutes 15    METs  6.1      Home Exercise Plan   Plans to continue exercise at Home (comment)   walking, seated exercise machine   Frequency Add 2 additional days to program exercise sessions.    Initial Home Exercises Provided 12/20/22      Oxygen   Maintain Oxygen Saturation 88% or higher             Nutrition:  Target Goals: Understanding of nutrition guidelines, daily intake of sodium 1500mg , cholesterol 200mg , calories 30% from fat and 7% or less from saturated fats, daily to have 5 or more servings of fruits and vegetables.  Education: All About Nutrition: -Group instruction provided by verbal, written material, interactive activities, discussions, models, and posters to present general guidelines for heart healthy nutrition including fat, fiber, MyPlate, the role of sodium in heart healthy nutrition, utilization of the nutrition label, and utilization of this knowledge for meal planning. Follow up email sent as well. Written material given at graduation.   Biometrics:  Pre Biometrics - 11/24/22 1150       Pre Biometrics   Height 5' 3.25" (1.607 m)    Weight 146 lb (66.2 kg)    Waist Circumference 35 inches    Hip Circumference 40 inches    Waist to Hip Ratio 0.88 %    BMI (Calculated) 25.64    Single Leg Stand 3.44 seconds             Post Biometrics - 02/09/23 1620        Post  Biometrics   Height 5' 3.25" (1.607 m)    Weight 145 lb 6.4 oz (66 kg)    BMI (Calculated) 25.54    Single Leg Stand 13.5 seconds             Nutrition Therapy Plan and Nutrition Goals:  Nutrition Therapy & Goals - 11/24/22 1114       Nutrition Therapy   Diet Heart healthy, low Na, T2DM    Drug/Food Interactions Statins/Certain Fruits    Protein (specify units) 55-65g    Fiber 25 grams    Whole Grain Foods 3 servings    Saturated Fats 12 max. grams    Fruits and Vegetables 8 servings/day  Sodium 2 grams      Personal Nutrition Goals   Nutrition Goal ST: review handouts LT: follow  MyPlate guidelines, maintain A1C <7    Comments 67 y.o. F admitted to cardiac rehab s/p NSTEMI. PMHx includes HLD, DJD, T2DM, CAD, skin cancer (basal cell). Relevant medications reviewed. PYP Score: 65. Vegetables & Fruits 8/12. Breads, Grains & Cereals 8/12. Red & Processed Meat 10/12. Poultry 2/2. Fish & Shellfish 0/4. Beans, Nuts & Seeds 0/4. Milk & Dairy Foods 4/6. Toppings, Oils, Seasonings & Salt 16/20. Sweets, Snacks & Restaurant Food 7/14. Beverages 10/10. Parilee reports that she is enjoying more bland food and she does not have much of an appetite. She reports getting back her normal eating pattern as her husband did not enjoy many heart healthy foods. B: 2 eggs and spinach frittata with rye toast and small amount of butter, hot oatbran cereal with blueberries.  L: example salad with baked chicken or [place] sandwich (arnolds whole grain bread with nuts in it). She likes to treat herself to panera as well. D: sometimes does not eat. bowl of chicken noodle soup and bagel last night. She usually has sometime light. She reports her bigger meals are breakfast and lunch. Drinks: water. She uses olive oil most of the time. Madalen reports cooking with some salt, but does not add any more. Discussed heart healthy eating and T2DM MNT.      Intervention Plan   Intervention Prescribe, educate and counsel regarding individualized specific dietary modifications aiming towards targeted core components such as weight, hypertension, lipid management, diabetes, heart failure and other comorbidities.;Nutrition handout(s) given to patient.    Expected Outcomes Short Term Goal: Understand basic principles of dietary content, such as calories, fat, sodium, cholesterol and nutrients.;Short Term Goal: A plan has been developed with personal nutrition goals set during dietitian appointment.;Long Term Goal: Adherence to prescribed nutrition plan.             Nutrition Assessments:  MEDIFICTS Score Key: ?70 Need to make  dietary changes  40-70 Heart Healthy Diet ? 40 Therapeutic Level Cholesterol Diet  Flowsheet Row Cardiac Rehab from 02/14/2023 in [facility] Cardiac and Pulmonary Rehab  Picture Your Plate Total Score on Discharge 71      Picture Your Plate Scores: [id number] Unhealthy dietary pattern with much room for improvement. 41-50 Dietary pattern unlikely to meet recommendations for good health and room for improvement. 51-60 More healthful dietary pattern, with some room for improvement.  >60 Healthy dietary pattern, although there may be some specific behaviors that could be improved.    Nutrition Goals Re-Evaluation:  Nutrition Goals Re-Evaluation     [place] Name 12/20/22 1619 01/10/23 1609 02/16/23 1623         Goals   Nutrition Goal ST: review handouts LT: follow MyPlate guidelines, maintain A1C <7 Short: Continue to increase more vegetable intake Long: Continue to eat heart healthy diet Short: Continue to increase more vegetable intake Long: Continue to eat heart healthy diet     Comment Yanci states she has been eating better overall. She has increased more vegetables by substituting her chips for "veggie" chips. She seasons them to taste as close as possible but enjoys doing it. Lately, she has been eating hot oatbrand cereal and adds mixed fresh berries to it for breakfast. Her recent  A1C was 6.9% and hopes to keep it below 7%. Chauntee is doing well in rehab. She continues to work on her diet.  She is trying to get in more  vegetables.  She does not eat a lot of meat so she tries not to stress over it. If she has meat, it's usually chicken or [place].  She does like shrimp on occasion as well.  She does not check her sugar, but has not been feeling any swings and tries to keep her diet balanced. Esta admits she has not been eating good but has been sick the last couple of weeks so she hasn't been on her normal schedule. Normally before then, she was making changes and still working on increase vegetables, she  usually roasts them.  She is getting her A1C checked in April. Last one was 6.8%. She is graduating in the next couple of weeks and will continue to follow her recommendations.     Expected Outcome Short: Continue to increase more vegetable intake Long: Continue to eat heart healthy diet Short; Continue to add in variety of fruit and vegetables Long: continue to focus on heart healthy eating Short: Graduate Long: Continue to eat heart healthy diet              Nutrition Goals Discharge (Final Nutrition Goals Re-Evaluation):  Nutrition Goals Re-Evaluation - 02/16/23 1623       Goals   Nutrition Goal Short: Continue to increase more vegetable intake Long: Continue to eat heart healthy diet    Comment Hillery admits she has not been eating good but has been sick the last couple of weeks so she hasn't been on her normal schedule. Normally before then, she was making changes and still working on increase vegetables, she usually roasts them.  She is getting her A1C checked in April. Last one was 6.8%. She is graduating in the next couple of weeks and will continue to follow her recommendations.    Expected Outcome Short: Graduate Long: Continue to eat heart healthy diet             Psychosocial: Target Goals: Acknowledge presence or absence of significant depression and/or stress, maximize coping skills, provide positive support system. Participant is able to verbalize types and ability to use techniques and skills needed for reducing stress and depression.   Education: Stress, Anxiety, and Depression - Group verbal and visual presentation to define topics covered.  Reviews how body is impacted by stress, anxiety, and depression.  Also discusses healthy ways to reduce stress and to treat/manage anxiety and depression.  Written material given at graduation. Flowsheet Row Cardiac Rehab from 12/08/2022 in [facility] Cardiac and Pulmonary Rehab  Date 12/08/22  Educator KW  Instruction Review Code 1-  [employer] Understanding       Education: Sleep Hygiene -Provides group verbal and written instruction about how sleep can affect your health.  Define sleep hygiene, discuss sleep cycles and impact of sleep habits. Review good sleep hygiene tips.    Initial Review & Psychosocial Screening:  Initial Psych Review & Screening - 11/23/22 1008       Initial Review   Current issues with Current Stress Concerns;Current Depression    Source of Stress Concerns Family    Comments husband recently passed after a 2 year battle with esophagus cancer      Family Dynamics   Good Support System? Yes   three children     Barriers   Psychosocial barriers to participate in program There are no identifiable barriers or psychosocial needs.;The patient should benefit from training in stress management and relaxation.      Screening Interventions   Interventions Encouraged to exercise;Provide feedback about  the scores to participant;To provide support and resources with identified psychosocial needs    Expected Outcomes Short Term goal: Utilizing psychosocial counselor, staff and physician to assist with identification of specific Stressors or current issues interfering with healing process. Setting desired goal for each stressor or current issue identified.;Long Term Goal: Stressors or current issues are controlled or eliminated.;Short Term goal: Identification and review with participant of any Quality of Life or Depression concerns found by scoring the questionnaire.;Long Term goal: The participant improves quality of Life and PHQ9 Scores as seen by post scores and/or verbalization of changes             Quality of Life Scores:   Quality of Life - 02/14/23 1745       Quality of Life Scores   Health/Function Pre 27.17 %    Health/Function Post 28.29 %    Health/Function % Change 4.12 %    Socioeconomic Pre 23.94 %    Socioeconomic Post 28.71 %    Socioeconomic % Change  19.92 %     Psych/Spiritual Pre 27.57 %    Psych/Spiritual Post 25.71 %    Psych/Spiritual % Change -6.75 %    Family Pre 27 %    Family Post 30 %    Family % Change 11.11 %    GLOBAL Pre 26.49 %    GLOBAL Post 28.03 %    GLOBAL % Change 5.81 %            Scores of 19 and below usually indicate a poorer quality of life in these areas.  A difference of  2-3 points is a clinically meaningful difference.  A difference of 2-3 points in the total score of the Quality of Life Index has been associated with significant improvement in overall quality of life, self-image, physical symptoms, and general health in studies assessing change in quality of life.  PHQ-9: Review Flowsheet       02/14/2023 12/20/2022 11/24/2022  Depression screen PHQ 2/9  Decreased Interest 1 3 3   Down, Depressed, Hopeless 1 2 2   PHQ - 2 Score 2 5 5   Altered sleeping 2 2 2   Tired, decreased energy 1 3 3   Change in appetite 2 2 3   Feeling bad or failure about yourself  0 1 0  Trouble concentrating 1 1 1   Moving slowly or fidgety/restless 0 0 0  Suicidal thoughts 0 0 0  PHQ-9 Score 8 14 14   Difficult doing work/chores - Very difficult Very difficult   Interpretation of Total Score  Total Score Depression Severity:  1-4 = Minimal depression, 5-9 = Mild depression, 10-14 = Moderate depression, 15-19 = Moderately severe depression, 20-27 = Severe depression   Psychosocial Evaluation and Intervention:  Psychosocial Evaluation - 11/23/22 1026       Psychosocial Evaluation & Interventions   Interventions Encouraged to exercise with the program and follow exercise prescription;Stress management education;Relaxation education    Comments Teria is coming to cardiac rehab after stents that were done at two separate procedures. Her first stent was in September and was the week after her husband passed away after battling 2 years with esophagus cancer. She was his main caretaker and mentioned her whole schedule was around his needs.  So now she is trying to find a "new normal." She is still working and has her 3 children as her main support system. Her heart trouble started right after the passing of her husband so she is tired and ready for a new  routine where she can feel better. Her sleep has suffered for a while now, where she is up every few hours. She thinks that might have been because she had to be up giving him pain medicine and such, but now her body just can't rest longer than about 4 hours. She is hoping this program will help boost her stamina and increase her knowledge on heart healthy living.    Expected Outcomes Short: attend cardiac rehab for education and exercise. Long: develop and maintain positive self care habits.    Continue Psychosocial Services  Follow up required by staff             Psychosocial Re-Evaluation:  Psychosocial Re-Evaluation     [place] Name 12/20/22 1618 01/10/23 1607 02/16/23 1620         Psychosocial Re-Evaluation   Current issues with Current Stress Concerns;Current Depression Current Stress Concerns;Current Depression Current Stress Concerns;Current Depression     Comments Carol-Ann has been going through a tough time recently due to her dog. He had to get surgery which was already high risk and is in the process of recovering which is very stressful on her right now. She is still waiting to hear back if its cancer. She shared this dog with her husband who recently passed which is why this is emotional for her. She said she felt a little more positive today as the dog seemed more spry so she has high hopes he is feeling better. She says coming to rehab and exercising at a higher workloads helps her relieve her stress. She takes Cymbalta for depression and feels it does help her. She has great support from her kids. Shya is doing well in rehab.  She is doing well mentally.  She has an answer to her dog and has come to terms with his cancer diagnosis and is prepared to manage it.  She is  sleeping pretty well. She is still taking the Cymbalta  but not sure if it working.  She as going to come off of it before finding out about her dog, so she decided to stay on it. Adalind is doing well mentally. Her dog did pass weeks ago but she said it has been a relief as she does not have to worry about taking care of him anymore. She is enjoying her walking at home daily. She gets involved in yardwork. Continues to take Cymbalta and declines other problems at this time. She is graduating in a couple weeks and loved the program now that she can do more physically!     Expected Outcomes Short: Enjoy time with dog, continue coming to rehab for stress management Long: Continue to maintain positive attitude and stay compliant with medication short; continue to enjoy time with dog Long: Continue to use cymbalta to help cope with dog Short: Graduate Long: Continue to stay compliant with medications and utilize exercise for stress management     Interventions Encouraged to attend Cardiac Rehabilitation for the exercise Encouraged to attend Cardiac Rehabilitation for the exercise Encouraged to attend Cardiac Rehabilitation for the exercise     Continue Psychosocial Services  Follow up required by staff Follow up required by staff Follow up required by staff              Psychosocial Discharge (Final Psychosocial Re-Evaluation):  Psychosocial Re-Evaluation - 02/16/23 1620       Psychosocial Re-Evaluation   Current issues with Current Stress Concerns;Current Depression    Comments Romika is doing well  mentally. Her dog did pass weeks ago but she said it has been a relief as she does not have to worry about taking care of him anymore. She is enjoying her walking at home daily. She gets involved in yardwork. Continues to take Cymbalta and declines other problems at this time. She is graduating in a couple weeks and loved the program now that she can do more physically!    Expected Outcomes Short: Graduate  Long: Continue to stay compliant with medications and utilize exercise for stress management    Interventions Encouraged to attend Cardiac Rehabilitation for the exercise    Continue Psychosocial Services  Follow up required by staff             Vocational Rehabilitation: Provide vocational rehab assistance to qualifying candidates.   Vocational Rehab Evaluation & Intervention:  Vocational Rehab - 11/23/22 1008       Initial Vocational Rehab Evaluation & Intervention   Assessment shows need for Vocational Rehabilitation No             Education: Education Goals: Education classes will be provided on a variety of topics geared toward better understanding of heart health and risk factor modification. Participant will state understanding/return demonstration of topics presented as noted by education test scores.  Learning Barriers/Preferences:  Learning Barriers/Preferences - 11/23/22 1008       Learning Barriers/Preferences   Learning Barriers None    Learning Preferences None             General Cardiac Education Topics:  AED/CPR: - Group verbal and written instruction with the use of models to demonstrate the basic use of the AED with the basic ABC's of resuscitation.   Anatomy and Cardiac Procedures: - Group verbal and visual presentation and models provide information about basic cardiac anatomy and function. Reviews the testing methods done to diagnose heart disease and the outcomes of the test results. Describes the treatment choices: Medical Management, Angioplasty, or Coronary Bypass Surgery for treating various heart conditions including Myocardial Infarction, Angina, Valve Disease, and Cardiac Arrhythmias.  Written material given at graduation. Flowsheet Row Cardiac Rehab from 12/08/2022 in [facility] Cardiac and Pulmonary Rehab  Education need identified 11/24/22       Medication Safety: - Group verbal and visual instruction to review commonly prescribed  medications for heart and lung disease. Reviews the medication, class of the drug, and side effects. Includes the steps to properly store meds and maintain the prescription regimen.  Written material given at graduation.   Intimacy: - Group verbal instruction through game format to discuss how heart and lung disease can affect sexual intimacy. Written material given at graduation..   Know Your Numbers and Heart Failure: - Group verbal and visual instruction to discuss disease risk factors for cardiac and pulmonary disease and treatment options.  Reviews associated critical values for Overweight/Obesity, Hypertension, Cholesterol, and Diabetes.  Discusses basics of heart failure: signs/symptoms and treatments.  Introduces Heart Failure Zone chart for action plan for heart failure.  Written material given at graduation.   Infection Prevention: - Provides verbal and written material to individual with discussion of infection control including proper hand washing and proper equipment cleaning during exercise session. Flowsheet Row Cardiac Rehab from 12/08/2022 in [facility] Cardiac and Pulmonary Rehab  Date 11/24/22  Educator [facility]  Instruction Review Code 1- Verbalizes Understanding       Falls Prevention: - Provides verbal and written material to individual with discussion of falls prevention and safety. Flowsheet Row Cardiac  Rehab from 12/08/2022 in [facility] Cardiac and Pulmonary Rehab  Date 11/24/22  Educator [facility]  Instruction Review Code 1- Verbalizes Understanding       Other: -Provides group and verbal instruction on various topics (see comments)   Knowledge Questionnaire Score:  Knowledge Questionnaire Score - 02/14/23 1744       Knowledge Questionnaire Score   Pre Score 25/26    Post Score 25/26             Core Components/Risk Factors/Patient Goals at Admission:  Personal Goals and Risk Factors at Admission - 11/24/22 1151       Core Components/Risk Factors/Patient Goals on  Admission    Weight Management Yes    Intervention Weight Management: Develop a combined nutrition and exercise program designed to reach desired caloric intake, while maintaining appropriate intake of nutrient and fiber, sodium and fats, and appropriate energy expenditure required for the weight goal.;Weight Management: Provide education and appropriate resources to help participant work on and attain dietary goals.    Admit Weight 146 lb (66.2 kg)    Goal Weight: Short Term 146 lb (66.2 kg)    Goal Weight: Long Term 146 lb (66.2 kg)    Expected Outcomes Short Term: Continue to assess and modify interventions until short term weight is achieved;Long Term: Adherence to nutrition and physical activity/exercise program aimed toward attainment of established weight goal;Weight Maintenance: Understanding of the daily nutrition guidelines, which includes 25-35% calories from fat, 7% or less cal from saturated fats, less than 200mg  cholesterol, less than 1.5gm of sodium, & 5 or more servings of fruits and vegetables daily;Understanding recommendations for meals to include 15-35% energy as protein, 25-35% energy from fat, 35-60% energy from carbohydrates, less than 200mg  of dietary cholesterol, 20-35 gm of total fiber daily;Understanding of distribution of calorie intake throughout the day with the consumption of 4-5 meals/snacks    Diabetes Yes    Intervention Provide education about signs/symptoms and action to take for hypo/hyperglycemia.;Provide education about proper nutrition, including hydration, and aerobic/resistive exercise prescription along with prescribed medications to achieve blood glucose in normal ranges: Fasting glucose 65-99 mg/dL    Expected Outcomes Short Term: Participant verbalizes understanding of the signs/symptoms and immediate care of hyper/hypoglycemia, proper foot care and importance of medication, aerobic/resistive exercise and nutrition plan for blood glucose control.;Long Term:  Attainment of HbA1C < 7%.    Hypertension Yes    Intervention Provide education on lifestyle modifcations including regular physical activity/exercise, weight management, moderate sodium restriction and increased consumption of fresh fruit, vegetables, and low fat dairy, alcohol moderation, and smoking cessation.;Monitor prescription use compliance.    Expected Outcomes Short Term: Continued assessment and intervention until BP is < 140/44mm HG in hypertensive participants. < 130/52mm HG in hypertensive participants with diabetes, heart failure or chronic kidney disease.;Long Term: Maintenance of blood pressure at goal levels.    Lipids Yes    Intervention Provide education and support for participant on nutrition & aerobic/resistive exercise along with prescribed medications to achieve LDL 70mg , HDL >40mg .    Expected Outcomes Short Term: Participant states understanding of desired cholesterol values and is compliant with medications prescribed. Participant is following exercise prescription and nutrition guidelines.;Long Term: Cholesterol controlled with medications as prescribed, with individualized exercise RX and with personalized nutrition plan. Value goals: LDL < 70mg , HDL > 40 mg.             Education:Diabetes - Individual verbal and written instruction to review signs/symptoms of diabetes, desired ranges of glucose  level fasting, after meals and with exercise. Acknowledge that pre and post exercise glucose checks will be done for 3 sessions at entry of program. Alice from 12/08/2022 in [facility] Cardiac and Pulmonary Rehab  Date 11/24/22  Educator [facility]  Instruction Review Code 1- Verbalizes Understanding       Core Components/Risk Factors/Patient Goals Review:   Goals and Risk Factor Review     Row Name 12/20/22 1621 01/10/23 1612 02/16/23 1625         Core Components/Risk Factors/Patient Goals Review   Personal Goals Review Weight  Management/Obesity;Diabetes;Hypertension Weight Management/Obesity;Diabetes;Hypertension Weight Management/Obesity;Diabetes;Hypertension     Review Anacamila states her A1C stays below 7%, last check in September it was 6.9%. She states she was never told she needed to check her sugars and was no resources to do so. Encouraged her to reach out to her doctor to make sure what she should be doing with her sugars. She takes Metformin for DM.  She generally doesnt eat a lot of high sugar foods or other food that would spike her sugar. She does not check weight but does not have any goals associated with weight. She does want to maintain but is not interested in taking any other action. She is taking all medicaitons as prescribed with no issues. Roselyne is doing well in rehab.  She has not had any swings with her sugar, unless she eats a donut.  Her weight is staying steady.  Her pressures are doing well and she does check those at home.  She does not have any medicaiton problems and feels pretty well managed. Vania states she hasn't really checked her sugars, as she states her doctor doesn't need to but, encouraged her to double check- sees PCP in May, encouraged to call as well. Her BP at rehab has been stable and will start checking those at home after she graduates in a couple weeks. Still taking all medications. Will start weighing everyday after she graduates. Plans to maintain while trying to comply with diet changes. Currnently weights 142-145 lb. She will be graduating in the next couple of weeks and continue to monitor her weight, BP, and sugars.     Expected Outcomes Short: Check with doctor on blood sugars Long: Continue to manage lifestyle risk factors long-term short: Continue to maintain weight lOng: continue to monitor risk factors. Short: Graduate Long: Continue to manage lifestyle risk factors              Core Components/Risk Factors/Patient Goals at Discharge (Final Review):   Goals and Risk  Factor Review - 02/16/23 1625       Core Components/Risk Factors/Patient Goals Review   Personal Goals Review Weight Management/Obesity;Diabetes;Hypertension    Review Cookie states she hasn't really checked her sugars, as she states her doctor doesn't need to but, encouraged her to double check- sees PCP in May, encouraged to call as well. Her BP at rehab has been stable and will start checking those at home after she graduates in a couple weeks. Still taking all medications. Will start weighing everyday after she graduates. Plans to maintain while trying to comply with diet changes. Currnently weights 142-145 lb. She will be graduating in the next couple of weeks and continue to monitor her weight, BP, and sugars.    Expected Outcomes Short: Graduate Long: Continue to manage lifestyle risk factors             ITP Comments:  ITP Comments     Row  Name 11/23/22 1026 11/24/22 1143 11/25/22 1558 12/01/22 1032 12/29/22 0952   ITP Comments Initial phone call completed. Diagnosis can be found in [facility] 9/11. EP Orientation scheduled for Wednesday 12/20 at 1:30. Completed 6MWT and gym orientation. Initial ITP created and sent for review to Dr. Emily Filbert, Medical Director. First full day of exercise!  Patient was oriented to gym and equipment including functions, settings, policies, and procedures.  Patient's individual exercise prescription and treatment plan were reviewed.  All starting workloads were established based on the results of the 6 minute walk test done at initial orientation visit.  The plan for exercise progression was also introduced and progression will be customized based on patient's performance and goals. 30 Day review completed. Medical Director ITP review done, changes made as directed, and signed approval by Medical Director.     new to program 30 Day review completed. Medical Director ITP review done, changes made as directed, and signed approval by Medical Director.    [place] Name  01/26/23 1420 02/23/23 0913         ITP Comments 30 day review completed. ITP sent to Dr. Emily Filbert, Medical Director of Cardiac Rehab. Continue with ITP unless changes are made by physician. 30 Day review completed. Medical Director ITP review done, changes made as directed, and signed approval by Medical Director.               Comments:

## 2023-02-23 NOTE — Progress Notes (Signed)
Daily Session Note  Patient Details  Name: Tammie Smith MRN: [id number] Date of Birth: 1956/08/22 Referring Provider:   Flowsheet Row Cardiac Rehab from 11/24/2022 in [facility] Cardiac and Pulmonary Rehab  Referring Provider End       Encounter Date: 02/23/2023  Check In:  Session Check In - 02/23/23 1609       Check-In   Supervising physician immediately available to respond to emergencies See telemetry face sheet for immediately available ER MD    Location [facility]-Cardiac & Pulmonary Rehab    Staff Present Renita Papa, RN BSN;Joseph Tessie Fass, Ernestina Patches, RN, [place]    Virtual Visit No    Medication changes reported     No    Fall or balance concerns reported    No    Warm-up and Cool-down Performed on first and last piece of equipment    Resistance Training Performed Yes    VAD Patient? No    PAD/SET Patient? No      Pain Assessment   Currently in Pain? No/denies                Social History   Tobacco Use  Smoking Status Never  Smokeless Tobacco Never    Goals Met:  Independence with exercise equipment Exercise tolerated well No report of concerns or symptoms today Strength training completed today  Goals Unmet:  Not Applicable  Comments: Pt able to follow exercise prescription today without complaint.  Will continue to monitor for progression.    Dr. Emily Filbert is Medical Director for [place].  Dr. Ottie Glazier is Medical Director for [facility] Pulmonary Rehabilitation.

## 2023-02-24 ENCOUNTER — Encounter: Payer: Managed Care, Other (non HMO) | Admitting: *Deleted

## 2023-02-24 DIAGNOSIS — Z955 Presence of coronary angioplasty implant and graft: Secondary | ICD-10-CM

## 2023-02-24 DIAGNOSIS — I252 Old myocardial infarction: Secondary | ICD-10-CM | POA: Diagnosis not present

## 2023-02-24 DIAGNOSIS — I214 Non-ST elevation (NSTEMI) myocardial infarction: Secondary | ICD-10-CM

## 2023-02-24 NOTE — Progress Notes (Signed)
Daily Session Note  Patient Details  Name: Tammie Smith MRN: [id number] Date of Birth: 22-Sep-1956 Referring Provider:   Flowsheet Row Cardiac Rehab from 11/24/2022 in [facility] Cardiac and Pulmonary Rehab  Referring Provider End       Encounter Date: 02/24/2023  Check In:  Session Check In - 02/24/23 1558       Check-In   Supervising physician immediately available to respond to emergencies See telemetry face sheet for immediately available ER MD    Location [facility]-Cardiac & Pulmonary Rehab    Staff Present Renita Papa, RN BSN;Noah Tickle, BS, Exercise Physiologist;Joseph Tessie Fass, [place]    Virtual Visit No    Medication changes reported     No    Fall or balance concerns reported    No    Warm-up and Cool-down Performed on first and last piece of equipment    Resistance Training Performed Yes    VAD Patient? No    PAD/SET Patient? No      Pain Assessment   Currently in Pain? No/denies                Social History   Tobacco Use  Smoking Status Never  Smokeless Tobacco Never    Goals Met:  Independence with exercise equipment Exercise tolerated well No report of concerns or symptoms today Strength training completed today  Goals Unmet:  Not Applicable  Comments: Pt able to follow exercise prescription today without complaint.  Will continue to monitor for progression.    Dr. Emily Filbert is Medical Director for [place].  Dr. Ottie Glazier is Medical Director for [facility] Pulmonary Rehabilitation.

## 2023-02-28 ENCOUNTER — Encounter: Payer: Managed Care, Other (non HMO) | Admitting: *Deleted

## 2023-02-28 DIAGNOSIS — I214 Non-ST elevation (NSTEMI) myocardial infarction: Secondary | ICD-10-CM

## 2023-02-28 DIAGNOSIS — I252 Old myocardial infarction: Secondary | ICD-10-CM | POA: Diagnosis not present

## 2023-02-28 NOTE — Progress Notes (Signed)
Daily Session Note  Patient Details  Name: Tammie Smith MRN: [id number] Date of Birth: 1956/10/03 Referring Provider:   Flowsheet Row Cardiac Rehab from 11/24/2022 in [facility] Cardiac and Pulmonary Rehab  Referring Provider End       Encounter Date: 02/28/2023  Check In:  Session Check In - 02/28/23 1653       Check-In   Supervising physician immediately available to respond to emergencies See telemetry face sheet for immediately available ER MD    Location [facility]-Cardiac & Pulmonary Rehab    Staff Present Darlyne Russian, RN, ADN;Joseph Tessie Fass, RCP,RRT,BSRT;Noah Tickle, BS, Exercise Physiologist    Virtual Visit No    Medication changes reported     No    Fall or balance concerns reported    No    Warm-up and Cool-down Performed on first and last piece of equipment    Resistance Training Performed Yes    VAD Patient? No    PAD/SET Patient? No      Pain Assessment   Currently in Pain? No/denies                Social History   Tobacco Use  Smoking Status Never  Smokeless Tobacco Never    Goals Met:  Independence with exercise equipment Exercise tolerated well No report of concerns or symptoms today Strength training completed today  Goals Unmet:  Not Applicable  Comments: Pt able to follow exercise prescription today without complaint.  Will continue to monitor for progression.    Dr. Emily Filbert is Medical Director for [place].  Dr. Ottie Glazier is Medical Director for [facility] Pulmonary Rehabilitation.

## 2023-03-02 ENCOUNTER — Encounter: Payer: Managed Care, Other (non HMO) | Admitting: *Deleted

## 2023-03-02 DIAGNOSIS — I214 Non-ST elevation (NSTEMI) myocardial infarction: Secondary | ICD-10-CM

## 2023-03-02 DIAGNOSIS — I252 Old myocardial infarction: Secondary | ICD-10-CM | POA: Diagnosis not present

## 2023-03-02 DIAGNOSIS — Z955 Presence of coronary angioplasty implant and graft: Secondary | ICD-10-CM

## 2023-03-02 NOTE — Progress Notes (Signed)
Discharge Summary  Tammie Smith DOB: [birth year]  Tammie Smith graduated today from  rehab with 36 sessions completed.  Details of the patient's exercise prescription and what She needs to do in order to continue the prescription and progress were discussed with patient.  Patient was given a copy of prescription and goals.  Patient verbalized understanding.  Tammie Smith plans to continue to exercise by walking.   [place] Name 11/24/22 1143 02/09/23 1615       6 Minute Walk   Phase Initial Discharge    Distance 1370 feet 1845 feet    Distance % Change -- 34.7 %    Distance Feet Change -- 475 ft    Walk Time 6 minutes 6 minutes    # of Rest Breaks 0 0    MPH 2.59 3.49    METS 3.35 4.33    RPE 7 11    Perceived Dyspnea  0 0    VO2 Peak 11.72 15.17    Symptoms No No    Resting HR 84 bpm 78 bpm    Resting BP 112/62 112/72    Resting Oxygen Saturation  95 % 97 %    Exercise Oxygen Saturation  during 6 min walk 97 % 96 %    Max Ex. HR 105 bpm 120 bpm    Max Ex. BP 136/70 136/68    2 Minute Post BP 106/62 --

## 2023-03-02 NOTE — Progress Notes (Signed)
Daily Session Note  Patient Details  Name: Tammie Smith MRN: [id number] Date of Birth: 07/11/1956 Referring Provider:   Flowsheet Row Cardiac Rehab from 11/24/2022 in [facility] Cardiac and Pulmonary Rehab  Referring Provider End       Encounter Date: 03/02/2023  Check In:  Session Check In - 03/02/23 [place]       Check-In   Supervising physician immediately available to respond to emergencies See telemetry face sheet for immediately available ER MD    Location [facility]-Cardiac & Pulmonary Rehab    Staff Present Renita Papa, RN BSN;Megan Tamala Julian, RN, Terie Purser, RCP,RRT,BSRT    Virtual Visit No    Medication changes reported     No    Fall or balance concerns reported    No    Warm-up and Cool-down Performed on first and last piece of equipment    Resistance Training Performed Yes    VAD Patient? No    PAD/SET Patient? No      Pain Assessment   Currently in Pain? No/denies                Social History   Tobacco Use  Smoking Status Never  Smokeless Tobacco Never    Goals Met:  Independence with exercise equipment Exercise tolerated well No report of concerns or symptoms today Strength training completed today  Goals Unmet:  Not Applicable  Comments:  Annessa graduated today from  rehab with 36 sessions completed.  Details of the patient's exercise prescription and what She needs to do in order to continue the prescription and progress were discussed with patient.  Patient was given a copy of prescription and goals.  Patient verbalized understanding.  [place] plans to continue to exercise by walking.     Dr. Emily Filbert is Medical Director for [place].  Dr. Ottie Glazier is Medical Director for [facility] Pulmonary Rehabilitation.

## 2023-03-02 NOTE — Progress Notes (Signed)
Cardiac Individual Treatment Plan  Patient Details  Name: Tammie Smith MRN: [id number] Date of Birth: [birth year] Referring Provider:   Flowsheet Row Cardiac Rehab from 11/24/2022 in [facility] Cardiac and Pulmonary Rehab  Referring Provider End       Initial Encounter Date:  Flowsheet Row Cardiac Rehab from 11/24/2022 in [facility] Cardiac and Pulmonary Rehab  Date 11/24/22       Visit Diagnosis: NSTEMI (non-ST elevated myocardial infarction) [facility])  Status post coronary artery stent placement  Patient's Home Medications on Admission:  Current Outpatient Medications:    aspirin EC 81 MG tablet, Take 1 tablet (81 mg total) by mouth daily. Swallow whole., Disp: 90 tablet, Rfl: 3   atorvastatin (LIPITOR) 80 MG tablet, Take 1 tablet (80 mg total) by mouth daily., Disp: 90 tablet, Rfl: 3   DULoxetine (CYMBALTA) 30 MG capsule, Take 30 mg by mouth in the morning., Disp: , Rfl:    isosorbide mononitrate (IMDUR) 30 MG 24 hr tablet, Take 0.5 tablets (15 mg total) by mouth daily., Disp: 45 tablet, Rfl: 3   metFORMIN (GLUCOPHAGE-XR) 500 MG 24 hr tablet, Take 1 tablet (500 mg total) by mouth daily with supper., Disp: , Rfl:    nitroGLYCERIN (NITROSTAT) 0.4 MG SL tablet, Place 1 tablet (0.4 mg total) under the tongue every 5 (five) minutes as needed for chest pain., Disp: 90 tablet, Rfl: 3   propranolol (INDERAL) 40 MG tablet, Take 40 mg by mouth 2 (two) times daily., Disp: , Rfl:    RABEprazole (ACIPHEX) 20 MG tablet, Take 20 mg by mouth at bedtime., Disp: , Rfl:    ticagrelor (BRILINTA) 90 MG TABS tablet, Take 1 tablet (90 mg total) by mouth 2 (two) times daily., Disp: 180 tablet, Rfl: 3  Past Medical History: Past Medical History:  Diagnosis Date   Borderline glaucoma    Cancer ([place])    skin cancer basal cell   Coronary artery disease 08/16/2022   NSTEMI s/p PCI to OM1   Degenerative disc disease, lumbar    Gestational diabetes    Hyperlipemia    Right-sided carotid artery disease (H[facility])     echo done    Tobacco Use: Social History   Tobacco Use  Smoking Status Never  Smokeless Tobacco Never    Labs: Review Flowsheet       Latest Ref Rng & Units 08/16/2022 08/18/2022 10/21/2022  Labs for ITP Cardiac and Pulmonary Rehab  Cholestrol 100 - 199 mg/dL - 248  181   LDL (calc) 0 - 99 mg/dL - 167  113   HDL-C >39 mg/dL - 37  41   Trlycerides 0 - 149 mg/dL - 219  155   Hemoglobin A1c 4.8 - 5.6 % 6.9  - -     Exercise Target Goals: Exercise Program Goal: Individual exercise prescription set using results from initial 6 min walk test and THRR while considering  patient's activity barriers and safety.   Exercise Prescription Goal: Initial exercise prescription builds to 30-45 minutes a day of aerobic activity, 2-3 days per week.  Home exercise guidelines will be given to patient during program as part of exercise prescription that the participant will acknowledge.   Education: Aerobic Exercise: - Group verbal and visual presentation on the components of exercise prescription. Introduces F.I.T.T principle from ACSM for exercise prescriptions.  Reviews F.I.T.T. principles of aerobic exercise including progression. Written material given at graduation.   Education: Resistance Exercise: - Group verbal and visual presentation on the components of exercise prescription. Introduces  F.I.T.T principle from ACSM for exercise prescriptions  Reviews F.I.T.T. principles of resistance exercise including progression. Written material given at graduation.    Education: Exercise & Equipment Safety: - Individual verbal instruction and demonstration of equipment use and safety with use of the equipment. Flowsheet Row Cardiac Rehab from 12/08/2022 in [facility] Cardiac and Pulmonary Rehab  Date 11/24/22  Educator [facility]  Instruction Review Code 1- Verbalizes Understanding       Education: Exercise Physiology & General Exercise Guidelines: - Group verbal and written instruction with models to  review the exercise physiology of the cardiovascular system and associated critical values. Provides general exercise guidelines with specific guidelines to those with heart or lung disease.    Education: Flexibility, Balance, Mind/Body Relaxation: - Group verbal and visual presentation with interactive activity on the components of exercise prescription. Introduces F.I.T.T principle from ACSM for exercise prescriptions. Reviews F.I.T.T. principles of flexibility and balance exercise training including progression. Also discusses the mind body connection.  Reviews various relaxation techniques to help reduce and manage stress (i.e. Deep breathing, progressive muscle relaxation, and visualization). Balance handout provided to take home. Written material given at graduation.   Activity Barriers & Risk Stratification:  Activity Barriers & Cardiac Risk Stratification - 11/24/22 1145       Activity Barriers & Cardiac Risk Stratification   Activity Barriers Joint Problems;Back Problems    Cardiac Risk Stratification High             6 Minute Walk:  6 Minute Walk     Row Name 11/24/22 1143 02/09/23 1615       6 Minute Walk   Phase Initial Discharge    Distance 1370 feet 1845 feet    Distance % Change -- 34.7 %    Distance Feet Change -- 475 ft    Walk Time 6 minutes 6 minutes    # of Rest Breaks 0 0    MPH 2.59 3.49    METS 3.35 4.33    RPE 7 11    Perceived Dyspnea  0 0    VO2 Peak 11.72 15.17    Symptoms No No    Resting HR 84 bpm 78 bpm    Resting BP 112/62 112/72    Resting Oxygen Saturation  95 % 97 %    Exercise Oxygen Saturation  during 6 min walk 97 % 96 %    Max Ex. HR 105 bpm 120 bpm    Max Ex. BP 136/70 136/68    2 Minute Post BP 106/62 --             Oxygen Initial Assessment:   Oxygen Re-Evaluation:   Oxygen Discharge (Final Oxygen Re-Evaluation):   Initial Exercise Prescription:  Initial Exercise Prescription - 11/24/22 1100       Date of  Initial Exercise RX and Referring Provider   Date 11/24/22    Referring Provider End      Oxygen   Maintain Oxygen Saturation 88% or higher      Treadmill   MPH 3    Grade 1    Minutes 15    METs 3.71      Recumbant Bike   Level 2    RPM 50    Minutes 15    METs 3.35      Arm Ergometer   Level 1    RPM 30    Minutes 15    METs 3.35      REL-XR   Level 2  Speed 50    Minutes 15    METs 3.35      Track   Laps 42    Minutes 15    METs 3.25      Prescription Details   Frequency (times per week) 3    Duration Progress to 30 minutes of continuous aerobic without signs/symptoms of physical distress      Intensity   THRR 40-80% of Max Heartrate 112-140    Ratings of Perceived Exertion 11-13    Perceived Dyspnea 0-4      Progression   Progression Continue to progress workloads to maintain intensity without signs/symptoms of physical distress.      Resistance Training   Training Prescription Yes    Weight 3    Reps 10-15             Perform Capillary Blood Glucose checks as needed.  Exercise Prescription Changes:   Exercise Prescription Changes     Row Name 11/24/22 1100 11/30/22 1300 12/13/22 1100 12/20/22 1600 12/27/22 1400     Response to Exercise   Blood Pressure (Admit) 112/62 102/60 126/68 -- 102/60   Blood Pressure (Exercise) 136/70 142/62 128/58 -- 142/68   Blood Pressure (Exit) 106/62 112/60 122/60 -- 112/60   Heart Rate (Admit) 84 bpm 82 bpm 77 bpm -- 70 bpm   Heart Rate (Exercise) 105 bpm 126 bpm 133 bpm -- 135 bpm   Heart Rate (Exit) 85 bpm 102 bpm 90 bpm -- 87 bpm   Oxygen Saturation (Admit) 95 % -- -- -- --   Oxygen Saturation (Exercise) 97 % -- -- -- --   Oxygen Saturation (Exit) 97 % -- -- -- --   Rating of Perceived Exertion (Exercise) 7 12 13  -- 12   Perceived Dyspnea (Exercise) 0 -- -- -- --   Symptoms none none none -- none   Comments 6 MWT results First full day of exercise -- -- --   Duration -- Progress to 30 minutes of   aerobic without signs/symptoms of physical distress Continue with 30 min of aerobic exercise without signs/symptoms of physical distress. -- Continue with 30 min of aerobic exercise without signs/symptoms of physical distress.   Intensity -- THRR unchanged THRR unchanged -- THRR unchanged     Progression   Progression -- Continue to progress workloads to maintain intensity without signs/symptoms of physical distress. Continue to progress workloads to maintain intensity without signs/symptoms of physical distress. -- Continue to progress workloads to maintain intensity without signs/symptoms of physical distress.   Average METs -- 3.56 3.93 -- 3.99     Resistance Training   Training Prescription -- Yes Yes -- Yes   Weight -- 3 3 lb -- 3 lb   Reps -- 10-15 10-15 -- 10-15     Interval Training   Interval Training -- No No -- No     Treadmill   MPH -- 3 3 -- 3.2   Grade -- 1 1 -- 1   Minutes -- 15 15 -- 15   METs -- 3.71 3.71 -- 3.89     Recumbant Bike   Level -- -- 2 -- 3   Watts -- -- 30 -- 25   Minutes -- -- 15 -- 15   METs -- -- 3.45 -- 3.19     REL-XR   Level -- 2 2 -- 2   Minutes -- 15 15 -- 15   METs -- 3.4 4.6 -- 5     Home Exercise Plan  Plans to continue exercise at -- -- -- Home (comment)  walking, seated exercise machine Home (comment)  walking, seated exercise machine   Frequency -- -- -- Add 2 additional days to program exercise sessions. Add 2 additional days to program exercise sessions.   Initial Home Exercises Provided -- -- -- 12/20/22 12/20/22     Oxygen   Maintain Oxygen Saturation -- 88% or higher 88% or higher 88% or higher 88% or higher    Row Name 01/10/23 1100 01/24/23 1300 02/07/23 1100 02/21/23 1400       Response to Exercise   Blood Pressure (Admit) 124/72 118/60 104/60 102/60    Blood Pressure (Exercise) 128/62 -- -- --    Blood Pressure (Exit) 112/62 120/62 112/82 104/60    Heart Rate (Admit) 80 bpm 78 bpm 72 bpm 84 bpm    Heart Rate  (Exercise) 122 bpm 129 bpm 125 bpm 125 bpm    Heart Rate (Exit) 90 bpm 84 bpm 88 bpm 91 bpm    Rating of Perceived Exertion (Exercise) 13 13 12 12     Symptoms none none none none    Duration Continue with 30 min of aerobic exercise without signs/symptoms of physical distress. Continue with 30 min of aerobic exercise without signs/symptoms of physical distress. Continue with 30 min of aerobic exercise without signs/symptoms of physical distress. Continue with 30 min of aerobic exercise without signs/symptoms of physical distress.    Intensity THRR unchanged THRR unchanged THRR unchanged THRR unchanged      Progression   Progression Continue to progress workloads to maintain intensity without signs/symptoms of physical distress. Continue to progress workloads to maintain intensity without signs/symptoms of physical distress. Continue to progress workloads to maintain intensity without signs/symptoms of physical distress. Continue to progress workloads to maintain intensity without signs/symptoms of physical distress.    Average METs 4.48 4.41 4.55 4.54      Resistance Training   Training Prescription Yes Yes Yes Yes    Weight 3 lb 3 lb 3 lb 3 lb    Reps 10-15 10-15 10-15 10-15      Interval Training   Interval Training No No No No      Treadmill   MPH 3.5 3.5 3.5 3.5    Grade 1 1 1 1     Minutes 15 15 15 15     METs 4.16 4.16 4.16 4.16      Recumbant Bike   Level 3 3 -- --    Watts 30 32 -- --    Minutes 15 15 -- --    METs 3.42 -- -- --      NuStep   Level -- 3 -- --    Minutes -- 15 -- --    METs -- 3.2 -- --      REL-XR   Level 3 3 3 3     Minutes 15 15 15 15     METs 4.8 5.8 6 6.1      Home Exercise Plan   Plans to continue exercise at Home (comment)  walking, seated exercise machine Home (comment)  walking, seated exercise machine Home (comment)  walking, seated exercise machine Home (comment)  walking, seated exercise machine    Frequency Add 2 additional days to program  exercise sessions. Add 2 additional days to program exercise sessions. Add 2 additional days to program exercise sessions. Add 2 additional days to program exercise sessions.    Initial Home Exercises Provided 12/20/22 12/20/22 12/20/22 12/20/22  Oxygen   Maintain Oxygen Saturation 88% or higher 88% or higher 88% or higher 88% or higher             Exercise Comments:   Exercise Comments     Row Name 11/25/22 1558           Exercise Comments First full day of exercise!  Patient was oriented to gym and equipment including functions, settings, policies, and procedures.  Patient's individual exercise prescription and treatment plan were reviewed.  All starting workloads were established based on the results of the 6 minute walk test done at initial orientation visit.  The plan for exercise progression was also introduced and progression will be customized based on patient's performance and goals.                Exercise Goals and Review:   Exercise Goals     Row Name 11/24/22 1149             Exercise Goals   Increase Physical Activity Yes       Intervention Provide advice, education, support and counseling about physical activity/exercise needs.;Develop an individualized exercise prescription for aerobic and resistive training based on initial evaluation findings, risk stratification, comorbidities and participant's personal goals.       Expected Outcomes Short Term: Attend rehab on a regular basis to increase amount of physical activity.;Long Term: Add in home exercise to make exercise part of routine and to increase amount of physical activity.;Long Term: Exercising regularly at least 3-5 days a week.       Increase Strength and Stamina Yes       Intervention Provide advice, education, support and counseling about physical activity/exercise needs.;Develop an individualized exercise prescription for aerobic and resistive training based on initial evaluation findings,  risk stratification, comorbidities and participant's personal goals.       Expected Outcomes Short Term: Increase workloads from initial exercise prescription for resistance, speed, and METs.;Short Term: Perform resistance training exercises routinely during rehab and add in resistance training at home;Long Term: Improve cardiorespiratory fitness, muscular endurance and strength as measured by increased METs and functional capacity (6MWT)       Able to understand and use rate of perceived exertion (RPE) scale Yes       Intervention Provide education and explanation on how to use RPE scale       Expected Outcomes Short Term: Able to use RPE daily in rehab to express subjective intensity level;Long Term:  Able to use RPE to guide intensity level when exercising independently       Able to understand and use Dyspnea scale Yes       Intervention Provide education and explanation on how to use Dyspnea scale       Expected Outcomes Long Term: Able to use Dyspnea scale to guide intensity level when exercising independently;Short Term: Able to use Dyspnea scale daily in rehab to express subjective sense of shortness of breath during exertion       Knowledge and understanding of Target Heart Rate Range (THRR) Yes       Intervention Provide education and explanation of THRR including how the numbers were predicted and where they are located for reference       Expected Outcomes Short Term: Able to state/look up THRR;Long Term: Able to use THRR to govern intensity when exercising independently;Short Term: Able to use daily as guideline for intensity in rehab       Able to check pulse independently Yes  Intervention Provide education and demonstration on how to check pulse in carotid and radial arteries.;Review the importance of being able to check your own pulse for safety during independent exercise       Expected Outcomes Short Term: Able to explain why pulse checking is important during independent  exercise       Understanding of Exercise Prescription Yes       Intervention Provide education, explanation, and written materials on patient's individual exercise prescription       Expected Outcomes Short Term: Able to explain program exercise prescription;Long Term: Able to explain home exercise prescription to exercise independently                Exercise Goals Re-Evaluation :  Exercise Goals Re-Evaluation     Row Name 11/25/22 1600 11/30/22 1343 12/13/22 1128 12/20/22 1637 12/27/22 1447     Exercise Goal Re-Evaluation   Exercise Goals Review Increase Physical Activity;Able to understand and use rate of perceived exertion (RPE) scale;Knowledge and understanding of Target Heart Rate Range (THRR);Understanding of Exercise Prescription;Increase Strength and Stamina;Able to check pulse independently Increase Physical Activity;Understanding of Exercise Prescription;Increase Strength and Stamina Increase Physical Activity;Understanding of Exercise Prescription;Increase Strength and Stamina Increase Physical Activity;Understanding of Exercise Prescription;Increase Strength and Stamina;Able to understand and use rate of perceived exertion (RPE) scale;Able to check pulse independently;Knowledge and understanding of Target Heart Rate Range (THRR);Able to understand and use Dyspnea scale Increase Physical Activity;Increase Strength and Stamina;Understanding of Exercise Prescription   Comments Reviewed RPE scale, THR and program prescription with pt today.  Pt voiced understanding and was given a copy of goals to take home. Turkessa is off to a good start in rehab. She did well during her first session in rehab. She has an average MET level of 3.56 METs on the treadmill and XR. She did well with both machines at her initial exercise prescription. We will continue to monitor her progress in the program. Chantia continues to do well in rehab. She has continued to work on her inital exercise prescription  workloads and would benefit from starting to increase. Staff will remind patient to start moving her levels up. She is hitting her THR each session and RPEs are appropriate. Will continue to monitor. Reviewed home exercise with pt today.  Pt plans to walk and use her seated exercise machine for exercise. She does not know the name of it but states it is whole body where it utilizes both her arms and legs.  Reviewed THR, pulse, RPE, sign and symptoms, pulse oximetery and when to call 911 or MD.  Also discussed weather considerations and indoor options.  Pt voiced understanding. Shariece continues to do well in rehab. She recently increased her overall average MET level to 3.99 METs. She also improved to level 3 on the recumbent bike. She increased her speed on the treadmill to 3.2 mph while maintaining an incline of 1% as well. We will continue to monitor her progress.   Expected Outcomes Short: Use RPE daily to regulate intensity.  Long: Follow program prescription in THR. Short: Continue to follow current exercise prescription. Long: Continue to improve strength and stamina. Short: Start to increase workloads on all equipment (XR and RB to level 3) Long: Continue to increase overall MET level Short: Start watching HR during exercise Long: Continue to exercise independently at home Short: Continue to increase treadmill workload, incline. Long: Continue to improve strength and stamina.    [place] Name 01/10/23 1129 01/10/23 1605 01/24/23 1348 02/07/23 1110  02/16/23 1612     Exercise Goal Re-Evaluation   Exercise Goals Review Increase Physical Activity;Increase Strength and Stamina;Understanding of Exercise Prescription Increase Physical Activity;Increase Strength and Stamina;Understanding of Exercise Prescription Increase Physical Activity;Increase Strength and Stamina;Understanding of Exercise Prescription Increase Physical Activity;Increase Strength and Stamina;Understanding of Exercise Prescription Increase  Physical Activity;Increase Strength and Stamina;Understanding of Exercise Prescription   Comments Oceana continues to do well in rehab. She increased on her treadmill speed to a 3.5 mph. She also increased to level 3 on the XR and is now working up to 30 watts on the recumbent bike! She continues to reach her THR. She would definititely benefit from increasing to 4 lbs for handweights. We will continue to monitor. Sundee is doing well in rehab.  She continues to do her walk with her dog in the mornings and then goes for a walk in the afternoon as well. She does 1.3 miles daily up a hill one way and back down to ease back home.  She has learned her limits and feels that her stamina is recovering some. Infantof continues to do well in rehab. She has consistently worked at a MET level above 4 METs. She has also stayed consistent with her treadmill workload at a speed of 3.5 mph and an incline of 1%. She has done well with level 3 on the XR, recumbent bike, and T4 Nustep as well. We will continue to monitor her progress in the program. Yaffa continues to do well in rehab. She has stayed consistent at her treadmill workload and should be encouraged to increase it. She has also been consistent at level 3 on the XR and could benefit from going to level 4. She did work up to 6 METS on this last session! She continues to hit her THR. She is due for her post 6MWT and we hope to see improvement. Mauriyah is getting ready to graduate in the next couple of weeks. She also improved on her post 6MWT by 34%!  She walks several days per week for about 1.3 miles with some incline outside. It usually takes her about 30 minutes at a time. She can tell she is getting quicker. She got a Fitbit for Christmas and has been checking her HR. We reviewed THR and she knows its usually within her range. She will continue to walk at home after she graduates.   Expected Outcomes Short: Increase to 4 lb handweights Long: Continue to increase overall MET  level and stamina short; Conitnue to walk daily and buld stamina Long: continue to improve strength and stamina overall Short: Continue to increase workloads. Long: Continue to improve strength and stamina. Short: Improve on post 6MWT Long: Continue to increase overall MET level and stamina Short: Graduate Long: Continue to exercise independently at appropriate prescription    Row Name 02/21/23 1425             Exercise Goal Re-Evaluation   Exercise Goals Review Increase Physical Activity;Increase Strength and Stamina;Understanding of Exercise Prescription       Comments Laronda is doing well in the program and is close to graduating. She recently completed her post 6MWT and improved by 34.7%! She also has continued to walk the treadmill at a speed of 3.5 mph with an incline of 1%. She also has been consistent on the XR at level 3. We will continue to monitor her progress until she graduates from the program.       Expected Outcomes Short: Graduate. Long: Continue to exercise  independently.                Discharge Exercise Prescription (Final Exercise Prescription Changes):  Exercise Prescription Changes - 02/21/23 1400       Response to Exercise   Blood Pressure (Admit) 102/60    Blood Pressure (Exit) 104/60    Heart Rate (Admit) 84 bpm    Heart Rate (Exercise) 125 bpm    Heart Rate (Exit) 91 bpm    Rating of Perceived Exertion (Exercise) 12    Symptoms none    Duration Continue with 30 min of aerobic exercise without signs/symptoms of physical distress.    Intensity THRR unchanged      Progression   Progression Continue to progress workloads to maintain intensity without signs/symptoms of physical distress.    Average METs 4.54      Resistance Training   Training Prescription Yes    Weight 3 lb    Reps 10-15      Interval Training   Interval Training No      Treadmill   MPH 3.5    Grade 1    Minutes 15    METs 4.16      REL-XR   Level 3    Minutes 15    METs  6.1      Home Exercise Plan   Plans to continue exercise at Home (comment)   walking, seated exercise machine   Frequency Add 2 additional days to program exercise sessions.    Initial Home Exercises Provided 12/20/22      Oxygen   Maintain Oxygen Saturation 88% or higher             Nutrition:  Target Goals: Understanding of nutrition guidelines, daily intake of sodium 1500mg , cholesterol 200mg , calories 30% from fat and 7% or less from saturated fats, daily to have 5 or more servings of fruits and vegetables.  Education: All About Nutrition: -Group instruction provided by verbal, written material, interactive activities, discussions, models, and posters to present general guidelines for heart healthy nutrition including fat, fiber, MyPlate, the role of sodium in heart healthy nutrition, utilization of the nutrition label, and utilization of this knowledge for meal planning. Follow up email sent as well. Written material given at graduation.   Biometrics:  Pre Biometrics - 11/24/22 1150       Pre Biometrics   Height 5' 3.25" (1.607 m)    Weight 146 lb (66.2 kg)    Waist Circumference 35 inches    Hip Circumference 40 inches    Waist to Hip Ratio 0.88 %    BMI (Calculated) 25.64    Single Leg Stand 3.44 seconds             Post Biometrics - 02/09/23 1620        Post  Biometrics   Height 5' 3.25" (1.607 m)    Weight 145 lb 6.4 oz (66 kg)    BMI (Calculated) 25.54    Single Leg Stand 13.5 seconds             Nutrition Therapy Plan and Nutrition Goals:  Nutrition Therapy & Goals - 11/24/22 1114       Nutrition Therapy   Diet Heart healthy, low Na, T2DM    Drug/Food Interactions Statins/Certain Fruits    Protein (specify units) 55-65g    Fiber 25 grams    Whole Grain Foods 3 servings    Saturated Fats 12 max. grams    Fruits and Vegetables 8 servings/day  Sodium 2 grams      Personal Nutrition Goals   Nutrition Goal ST: review handouts LT: follow  MyPlate guidelines, maintain A1C <7    Comments 67 y.o. F admitted to cardiac rehab s/p NSTEMI. PMHx includes HLD, DJD, T2DM, CAD, skin cancer (basal cell). Relevant medications reviewed. PYP Score: 65. Vegetables & Fruits 8/12. Breads, Grains & Cereals 8/12. Red & Processed Meat 10/12. Poultry 2/2. Fish & Shellfish 0/4. Beans, Nuts & Seeds 0/4. Milk & Dairy Foods 4/6. Toppings, Oils, Seasonings & Salt 16/20. Sweets, Snacks & Restaurant Food 7/14. Beverages 10/10. Apolline reports that she is enjoying more bland food and she does not have much of an appetite. She reports getting back her normal eating pattern as her husband did not enjoy many heart healthy foods. B: 2 eggs and spinach frittata with rye toast and small amount of butter, hot oatbran cereal with blueberries.  L: example salad with baked chicken or [place] sandwich (arnolds whole grain bread with nuts in it). She likes to treat herself to panera as well. D: sometimes does not eat. bowl of chicken noodle soup and bagel last night. She usually has sometime light. She reports her bigger meals are breakfast and lunch. Drinks: water. She uses olive oil most of the time. Lazelle reports cooking with some salt, but does not add any more. Discussed heart healthy eating and T2DM MNT.      Intervention Plan   Intervention Prescribe, educate and counsel regarding individualized specific dietary modifications aiming towards targeted core components such as weight, hypertension, lipid management, diabetes, heart failure and other comorbidities.;Nutrition handout(s) given to patient.    Expected Outcomes Short Term Goal: Understand basic principles of dietary content, such as calories, fat, sodium, cholesterol and nutrients.;Short Term Goal: A plan has been developed with personal nutrition goals set during dietitian appointment.;Long Term Goal: Adherence to prescribed nutrition plan.             Nutrition Assessments:  MEDIFICTS Score Key: ?70 Need to make  dietary changes  40-70 Heart Healthy Diet ? 40 Therapeutic Level Cholesterol Diet  Flowsheet Row Cardiac Rehab from 02/14/2023 in [facility] Cardiac and Pulmonary Rehab  Picture Your Plate Total Score on Discharge 71      Picture Your Plate Scores: [id number] Unhealthy dietary pattern with much room for improvement. 41-50 Dietary pattern unlikely to meet recommendations for good health and room for improvement. 51-60 More healthful dietary pattern, with some room for improvement.  >60 Healthy dietary pattern, although there may be some specific behaviors that could be improved.    Nutrition Goals Re-Evaluation:  Nutrition Goals Re-Evaluation     Cash Name 12/20/22 1619 01/10/23 1609 02/16/23 1623         Goals   Nutrition Goal ST: review handouts LT: follow MyPlate guidelines, maintain A1C <7 Short: Continue to increase more vegetable intake Long: Continue to eat heart healthy diet Short: Continue to increase more vegetable intake Long: Continue to eat heart healthy diet     Comment Kynnlee states she has been eating better overall. She has increased more vegetables by substituting her chips for "veggie" chips. She seasons them to taste as close as possible but enjoys doing it. Lately, she has been eating hot oatbrand cereal and adds mixed fresh berries to it for breakfast. Her recent  A1C was 6.9% and hopes to keep it below 7%. Avynn is doing well in rehab. She continues to work on her diet.  She is trying to get in more  vegetables.  She does not eat a lot of meat so she tries not to stress over it. If she has meat, it's usually chicken or [place].  She does like shrimp on occasion as well.  She does not check her sugar, but has not been feeling any swings and tries to keep her diet balanced. Esta admits she has not been eating good but has been sick the last couple of weeks so she hasn't been on her normal schedule. Normally before then, she was making changes and still working on increase vegetables, she  usually roasts them.  She is getting her A1C checked in April. Last one was 6.8%. She is graduating in the next couple of weeks and will continue to follow her recommendations.     Expected Outcome Short: Continue to increase more vegetable intake Long: Continue to eat heart healthy diet Short; Continue to add in variety of fruit and vegetables Long: continue to focus on heart healthy eating Short: Graduate Long: Continue to eat heart healthy diet              Nutrition Goals Discharge (Final Nutrition Goals Re-Evaluation):  Nutrition Goals Re-Evaluation - 02/16/23 1623       Goals   Nutrition Goal Short: Continue to increase more vegetable intake Long: Continue to eat heart healthy diet    Comment Hillery admits she has not been eating good but has been sick the last couple of weeks so she hasn't been on her normal schedule. Normally before then, she was making changes and still working on increase vegetables, she usually roasts them.  She is getting her A1C checked in April. Last one was 6.8%. She is graduating in the next couple of weeks and will continue to follow her recommendations.    Expected Outcome Short: Graduate Long: Continue to eat heart healthy diet             Psychosocial: Target Goals: Acknowledge presence or absence of significant depression and/or stress, maximize coping skills, provide positive support system. Participant is able to verbalize types and ability to use techniques and skills needed for reducing stress and depression.   Education: Stress, Anxiety, and Depression - Group verbal and visual presentation to define topics covered.  Reviews how body is impacted by stress, anxiety, and depression.  Also discusses healthy ways to reduce stress and to treat/manage anxiety and depression.  Written material given at graduation. Flowsheet Row Cardiac Rehab from 12/08/2022 in [facility] Cardiac and Pulmonary Rehab  Date 12/08/22  Educator KW  Instruction Review Code 1-  [employer] Understanding       Education: Sleep Hygiene -Provides group verbal and written instruction about how sleep can affect your health.  Define sleep hygiene, discuss sleep cycles and impact of sleep habits. Review good sleep hygiene tips.    Initial Review & Psychosocial Screening:  Initial Psych Review & Screening - 11/23/22 1008       Initial Review   Current issues with Current Stress Concerns;Current Depression    Source of Stress Concerns Family    Comments husband recently passed after a 2 year battle with esophagus cancer      Family Dynamics   Good Support System? Yes   three children     Barriers   Psychosocial barriers to participate in program There are no identifiable barriers or psychosocial needs.;The patient should benefit from training in stress management and relaxation.      Screening Interventions   Interventions Encouraged to exercise;Provide feedback about  the scores to participant;To provide support and resources with identified psychosocial needs    Expected Outcomes Short Term goal: Utilizing psychosocial counselor, staff and physician to assist with identification of specific Stressors or current issues interfering with healing process. Setting desired goal for each stressor or current issue identified.;Long Term Goal: Stressors or current issues are controlled or eliminated.;Short Term goal: Identification and review with participant of any Quality of Life or Depression concerns found by scoring the questionnaire.;Long Term goal: The participant improves quality of Life and PHQ9 Scores as seen by post scores and/or verbalization of changes             Quality of Life Scores:   Quality of Life - 02/14/23 1745       Quality of Life Scores   Health/Function Pre 27.17 %    Health/Function Post 28.29 %    Health/Function % Change 4.12 %    Socioeconomic Pre 23.94 %    Socioeconomic Post 28.71 %    Socioeconomic % Change  19.92 %     Psych/Spiritual Pre 27.57 %    Psych/Spiritual Post 25.71 %    Psych/Spiritual % Change -6.75 %    Family Pre 27 %    Family Post 30 %    Family % Change 11.11 %    GLOBAL Pre 26.49 %    GLOBAL Post 28.03 %    GLOBAL % Change 5.81 %            Scores of 19 and below usually indicate a poorer quality of life in these areas.  A difference of  2-3 points is a clinically meaningful difference.  A difference of 2-3 points in the total score of the Quality of Life Index has been associated with significant improvement in overall quality of life, self-image, physical symptoms, and general health in studies assessing change in quality of life.  PHQ-9: Review Flowsheet       02/14/2023 12/20/2022 11/24/2022  Depression screen PHQ 2/9  Decreased Interest 1 3 3   Down, Depressed, Hopeless 1 2 2   PHQ - 2 Score 2 5 5   Altered sleeping 2 2 2   Tired, decreased energy 1 3 3   Change in appetite 2 2 3   Feeling bad or failure about yourself  0 1 0  Trouble concentrating 1 1 1   Moving slowly or fidgety/restless 0 0 0  Suicidal thoughts 0 0 0  PHQ-9 Score 8 14 14   Difficult doing work/chores - Very difficult Very difficult   Interpretation of Total Score  Total Score Depression Severity:  1-4 = Minimal depression, 5-9 = Mild depression, 10-14 = Moderate depression, 15-19 = Moderately severe depression, 20-27 = Severe depression   Psychosocial Evaluation and Intervention:  Psychosocial Evaluation - 11/23/22 1026       Psychosocial Evaluation & Interventions   Interventions Encouraged to exercise with the program and follow exercise prescription;Stress management education;Relaxation education    Comments Erleen is coming to cardiac rehab after stents that were done at two separate procedures. Her first stent was in September and was the week after her husband passed away after battling 2 years with esophagus cancer. She was his main caretaker and mentioned her whole schedule was around his needs.  So now she is trying to find a "new normal." She is still working and has her 3 children as her main support system. Her heart trouble started right after the passing of her husband so she is tired and ready for a new  routine where she can feel better. Her sleep has suffered for a while now, where she is up every few hours. She thinks that might have been because she had to be up giving him pain medicine and such, but now her body just can't rest longer than about 4 hours. She is hoping this program will help boost her stamina and increase her knowledge on heart healthy living.    Expected Outcomes Short: attend cardiac rehab for education and exercise. Long: develop and maintain positive self care habits.    Continue Psychosocial Services  Follow up required by staff             Psychosocial Re-Evaluation:  Psychosocial Re-Evaluation     Wilder Name 12/20/22 1618 01/10/23 1607 02/16/23 1620         Psychosocial Re-Evaluation   Current issues with Current Stress Concerns;Current Depression Current Stress Concerns;Current Depression Current Stress Concerns;Current Depression     Comments Anishia has been going through a tough time recently due to her dog. He had to get surgery which was already high risk and is in the process of recovering which is very stressful on her right now. She is still waiting to hear back if its cancer. She shared this dog with her husband who recently passed which is why this is emotional for her. She said she felt a little more positive today as the dog seemed more spry so she has high hopes he is feeling better. She says coming to rehab and exercising at a higher workloads helps her relieve her stress. She takes Cymbalta for depression and feels it does help her. She has great support from her kids. Katiana is doing well in rehab.  She is doing well mentally.  She has an answer to her dog and has come to terms with his cancer diagnosis and is prepared to manage it.  She is  sleeping pretty well. She is still taking the Cymbalta  but not sure if it working.  She as going to come off of it before finding out about her dog, so she decided to stay on it. Shaunika is doing well mentally. Her dog did pass weeks ago but she said it has been a relief as she does not have to worry about taking care of him anymore. She is enjoying her walking at home daily. She gets involved in yardwork. Continues to take Cymbalta and declines other problems at this time. She is graduating in a couple weeks and loved the program now that she can do more physically!     Expected Outcomes Short: Enjoy time with dog, continue coming to rehab for stress management Long: Continue to maintain positive attitude and stay compliant with medication short; continue to enjoy time with dog Long: Continue to use cymbalta to help cope with dog Short: Graduate Long: Continue to stay compliant with medications and utilize exercise for stress management     Interventions Encouraged to attend Cardiac Rehabilitation for the exercise Encouraged to attend Cardiac Rehabilitation for the exercise Encouraged to attend Cardiac Rehabilitation for the exercise     Continue Psychosocial Services  Follow up required by staff Follow up required by staff Follow up required by staff              Psychosocial Discharge (Final Psychosocial Re-Evaluation):  Psychosocial Re-Evaluation - 02/16/23 1620       Psychosocial Re-Evaluation   Current issues with Current Stress Concerns;Current Depression    Comments Chelsye is doing well  mentally. Her dog did pass weeks ago but she said it has been a relief as she does not have to worry about taking care of him anymore. She is enjoying her walking at home daily. She gets involved in yardwork. Continues to take Cymbalta and declines other problems at this time. She is graduating in a couple weeks and loved the program now that she can do more physically!    Expected Outcomes Short: Graduate  Long: Continue to stay compliant with medications and utilize exercise for stress management    Interventions Encouraged to attend Cardiac Rehabilitation for the exercise    Continue Psychosocial Services  Follow up required by staff             Vocational Rehabilitation: Provide vocational rehab assistance to qualifying candidates.   Vocational Rehab Evaluation & Intervention:  Vocational Rehab - 11/23/22 1008       Initial Vocational Rehab Evaluation & Intervention   Assessment shows need for Vocational Rehabilitation No             Education: Education Goals: Education classes will be provided on a variety of topics geared toward better understanding of heart health and risk factor modification. Participant will state understanding/return demonstration of topics presented as noted by education test scores.  Learning Barriers/Preferences:  Learning Barriers/Preferences - 11/23/22 1008       Learning Barriers/Preferences   Learning Barriers None    Learning Preferences None             General Cardiac Education Topics:  AED/CPR: - Group verbal and written instruction with the use of models to demonstrate the basic use of the AED with the basic ABC's of resuscitation.   Anatomy and Cardiac Procedures: - Group verbal and visual presentation and models provide information about basic cardiac anatomy and function. Reviews the testing methods done to diagnose heart disease and the outcomes of the test results. Describes the treatment choices: Medical Management, Angioplasty, or Coronary Bypass Surgery for treating various heart conditions including Myocardial Infarction, Angina, Valve Disease, and Cardiac Arrhythmias.  Written material given at graduation. Flowsheet Row Cardiac Rehab from 12/08/2022 in [facility] Cardiac and Pulmonary Rehab  Education need identified 11/24/22       Medication Safety: - Group verbal and visual instruction to review commonly prescribed  medications for heart and lung disease. Reviews the medication, class of the drug, and side effects. Includes the steps to properly store meds and maintain the prescription regimen.  Written material given at graduation.   Intimacy: - Group verbal instruction through game format to discuss how heart and lung disease can affect sexual intimacy. Written material given at graduation..   Know Your Numbers and Heart Failure: - Group verbal and visual instruction to discuss disease risk factors for cardiac and pulmonary disease and treatment options.  Reviews associated critical values for Overweight/Obesity, Hypertension, Cholesterol, and Diabetes.  Discusses basics of heart failure: signs/symptoms and treatments.  Introduces Heart Failure Zone chart for action plan for heart failure.  Written material given at graduation.   Infection Prevention: - Provides verbal and written material to individual with discussion of infection control including proper hand washing and proper equipment cleaning during exercise session. Flowsheet Row Cardiac Rehab from 12/08/2022 in [facility] Cardiac and Pulmonary Rehab  Date 11/24/22  Educator [facility]  Instruction Review Code 1- Verbalizes Understanding       Falls Prevention: - Provides verbal and written material to individual with discussion of falls prevention and safety. Flowsheet Row Cardiac  Rehab from 12/08/2022 in [facility] Cardiac and Pulmonary Rehab  Date 11/24/22  Educator [facility]  Instruction Review Code 1- Verbalizes Understanding       Other: -Provides group and verbal instruction on various topics (see comments)   Knowledge Questionnaire Score:  Knowledge Questionnaire Score - 02/14/23 1744       Knowledge Questionnaire Score   Pre Score 25/26    Post Score 25/26             Core Components/Risk Factors/Patient Goals at Admission:  Personal Goals and Risk Factors at Admission - 11/24/22 1151       Core Components/Risk Factors/Patient Goals on  Admission    Weight Management Yes    Intervention Weight Management: Develop a combined nutrition and exercise program designed to reach desired caloric intake, while maintaining appropriate intake of nutrient and fiber, sodium and fats, and appropriate energy expenditure required for the weight goal.;Weight Management: Provide education and appropriate resources to help participant work on and attain dietary goals.    Admit Weight 146 lb (66.2 kg)    Goal Weight: Short Term 146 lb (66.2 kg)    Goal Weight: Long Term 146 lb (66.2 kg)    Expected Outcomes Short Term: Continue to assess and modify interventions until short term weight is achieved;Long Term: Adherence to nutrition and physical activity/exercise program aimed toward attainment of established weight goal;Weight Maintenance: Understanding of the daily nutrition guidelines, which includes 25-35% calories from fat, 7% or less cal from saturated fats, less than 200mg  cholesterol, less than 1.5gm of sodium, & 5 or more servings of fruits and vegetables daily;Understanding recommendations for meals to include 15-35% energy as protein, 25-35% energy from fat, 35-60% energy from carbohydrates, less than 200mg  of dietary cholesterol, 20-35 gm of total fiber daily;Understanding of distribution of calorie intake throughout the day with the consumption of 4-5 meals/snacks    Diabetes Yes    Intervention Provide education about signs/symptoms and action to take for hypo/hyperglycemia.;Provide education about proper nutrition, including hydration, and aerobic/resistive exercise prescription along with prescribed medications to achieve blood glucose in normal ranges: Fasting glucose 65-99 mg/dL    Expected Outcomes Short Term: Participant verbalizes understanding of the signs/symptoms and immediate care of hyper/hypoglycemia, proper foot care and importance of medication, aerobic/resistive exercise and nutrition plan for blood glucose control.;Long Term:  Attainment of HbA1C < 7%.    Hypertension Yes    Intervention Provide education on lifestyle modifcations including regular physical activity/exercise, weight management, moderate sodium restriction and increased consumption of fresh fruit, vegetables, and low fat dairy, alcohol moderation, and smoking cessation.;Monitor prescription use compliance.    Expected Outcomes Short Term: Continued assessment and intervention until BP is < 140/47mm HG in hypertensive participants. < 130/64mm HG in hypertensive participants with diabetes, heart failure or chronic kidney disease.;Long Term: Maintenance of blood pressure at goal levels.    Lipids Yes    Intervention Provide education and support for participant on nutrition & aerobic/resistive exercise along with prescribed medications to achieve LDL 70mg , HDL >40mg .    Expected Outcomes Short Term: Participant states understanding of desired cholesterol values and is compliant with medications prescribed. Participant is following exercise prescription and nutrition guidelines.;Long Term: Cholesterol controlled with medications as prescribed, with individualized exercise RX and with personalized nutrition plan. Value goals: LDL < 70mg , HDL > 40 mg.             Education:Diabetes - Individual verbal and written instruction to review signs/symptoms of diabetes, desired ranges of glucose  level fasting, after meals and with exercise. Acknowledge that pre and post exercise glucose checks will be done for 3 sessions at entry of program. [place] from 12/08/2022 in [facility] Cardiac and Pulmonary Rehab  Date 11/24/22  Educator [facility]  Instruction Review Code 1- Verbalizes Understanding       Core Components/Risk Factors/Patient Goals Review:   Goals and Risk Factor Review     Row Name 12/20/22 1621 01/10/23 1612 02/16/23 1625         Core Components/Risk Factors/Patient Goals Review   Personal Goals Review Weight  Management/Obesity;Diabetes;Hypertension Weight Management/Obesity;Diabetes;Hypertension Weight Management/Obesity;Diabetes;Hypertension     Review Shaquaya states her A1C stays below 7%, last check in September it was 6.9%. She states she was never told she needed to check her sugars and was no resources to do so. Encouraged her to reach out to her doctor to make sure what she should be doing with her sugars. She takes Metformin for DM.  She generally doesnt eat a lot of high sugar foods or other food that would spike her sugar. She does not check weight but does not have any goals associated with weight. She does want to maintain but is not interested in taking any other action. She is taking all medicaitons as prescribed with no issues. Shanielle is doing well in rehab.  She has not had any swings with her sugar, unless she eats a donut.  Her weight is staying steady.  Her pressures are doing well and she does check those at home.  She does not have any medicaiton problems and feels pretty well managed. Steele states she hasn't really checked her sugars, as she states her doctor doesn't need to but, encouraged her to double check- sees PCP in May, encouraged to call as well. Her BP at rehab has been stable and will start checking those at home after she graduates in a couple weeks. Still taking all medications. Will start weighing everyday after she graduates. Plans to maintain while trying to comply with diet changes. Currnently weights 142-145 lb. She will be graduating in the next couple of weeks and continue to monitor her weight, BP, and sugars.     Expected Outcomes Short: Check with doctor on blood sugars Long: Continue to manage lifestyle risk factors long-term short: Continue to maintain weight lOng: continue to monitor risk factors. Short: Graduate Long: Continue to manage lifestyle risk factors              Core Components/Risk Factors/Patient Goals at Discharge (Final Review):   Goals and Risk  Factor Review - 02/16/23 1625       Core Components/Risk Factors/Patient Goals Review   Personal Goals Review Weight Management/Obesity;Diabetes;Hypertension    Review Bunnie states she hasn't really checked her sugars, as she states her doctor doesn't need to but, encouraged her to double check- sees PCP in May, encouraged to call as well. Her BP at rehab has been stable and will start checking those at home after she graduates in a couple weeks. Still taking all medications. Will start weighing everyday after she graduates. Plans to maintain while trying to comply with diet changes. Currnently weights 142-145 lb. She will be graduating in the next couple of weeks and continue to monitor her weight, BP, and sugars.    Expected Outcomes Short: Graduate Long: Continue to manage lifestyle risk factors             ITP Comments:  ITP Comments     Row  Name 11/23/22 1026 11/24/22 1143 11/25/22 1558 12/01/22 1032 12/29/22 0952   ITP Comments Initial phone call completed. Diagnosis can be found in [facility] 9/11. EP Orientation scheduled for Wednesday 12/20 at 1:30. Completed 6MWT and gym orientation. Initial ITP created and sent for review to Dr. Emily Filbert, Medical Director. First full day of exercise!  Patient was oriented to gym and equipment including functions, settings, policies, and procedures.  Patient's individual exercise prescription and treatment plan were reviewed.  All starting workloads were established based on the results of the 6 minute walk test done at initial orientation visit.  The plan for exercise progression was also introduced and progression will be customized based on patient's performance and goals. 30 Day review completed. Medical Director ITP review done, changes made as directed, and signed approval by Medical Director.     new to program 30 Day review completed. Medical Director ITP review done, changes made as directed, and signed approval by Medical Director.    [place]wood Name  01/26/23 1420 02/23/23 0913 03/02/23 1608       ITP Comments 30 day review completed. ITP sent to Dr. Emily Filbert, Medical Director of Cardiac Rehab. Continue with ITP unless changes are made by physician. 30 Day review completed. Medical Director ITP review done, changes made as directed, and signed approval by Medical Director. Aylanie graduated today from  rehab with 36 sessions completed.  Details of the patient's exercise prescription and what She needs to do in order to continue the prescription and progress were discussed with patient.  Patient was given a copy of prescription and goals.  Patient verbalized understanding.  Sumaia plans to continue to exercise by walking.              Comments: Discharge ITP

## 2023-03-17 LAB — HEPATIC FUNCTION PANEL
ALT: 42 IU/L — ABNORMAL HIGH (ref 0–32)
AST: 26 IU/L (ref 0–40)
Albumin: 4 g/dL (ref 3.9–4.9)
Alkaline Phosphatase: 69 IU/L (ref 44–121)
Bilirubin Total: <0.2 mg/dL (ref 0.0–1.2)
Bilirubin, Direct: <0.1 mg/dL (ref 0.00–0.40)
Total Protein: 6 g/dL (ref 6.0–8.5)

## 2023-03-17 LAB — LIPID PANEL
Chol/HDL Ratio: 3.4 ratio (ref 0.0–4.4)
Cholesterol, Total: 138 mg/dL (ref 100–199)
HDL: 41 mg/dL (ref >39)
LDL Chol Calc (NIH): 76 mg/dL (ref 0–99)
Triglycerides: 116 mg/dL (ref 0–149)
VLDL Cholesterol Cal: 21 mg/dL (ref 5–40)

## 2023-03-18 ENCOUNTER — Other Ambulatory Visit: Payer: Self-pay

## 2023-03-18 DIAGNOSIS — Z79899 Other long term (current) drug therapy: Secondary | ICD-10-CM

## 2023-03-25 ENCOUNTER — Encounter: Payer: Self-pay | Admitting: Internal Medicine

## 2023-03-25 ENCOUNTER — Ambulatory Visit: Payer: Managed Care, Other (non HMO) | Attending: Medical | Admitting: Internal Medicine

## 2023-03-25 VITALS — BP 106/76 | HR 80 | Ht 63.0 in | Wt 143.0 lb

## 2023-03-25 DIAGNOSIS — I25118 Atherosclerotic heart disease of native coronary artery with other forms of angina pectoris: Secondary | ICD-10-CM

## 2023-03-25 DIAGNOSIS — E782 Mixed hyperlipidemia: Secondary | ICD-10-CM | POA: Diagnosis not present

## 2023-03-25 DIAGNOSIS — Z79899 Other long term (current) drug therapy: Secondary | ICD-10-CM | POA: Diagnosis not present

## 2023-03-25 DIAGNOSIS — I255 Ischemic cardiomyopathy: Secondary | ICD-10-CM

## 2023-03-25 MED ORDER — EZETIMIBE 10 MG PO TABS: 10.0000 mg | ORAL_TABLET | Freq: Every day | ORAL | 3 refills | Status: DC

## 2023-03-25 NOTE — Patient Instructions (Signed)
Medication Instructions:  START TAKING: Zetia 10 mg by mouth daily  *If you need a refill on your cardiac medications before your next appointment, please call your pharmacy*   Lab Work: Your physician recommends that you return for lab work in 6 weeks (LFT, Lipid)  If you have labs (blood work) drawn today and your tests are completely normal, you will receive your results only by: MyChart Message (if you have MyChart) OR A paper copy in the mail If you have any lab test that is abnormal or we need to change your treatment, we will call you to review the results.   Testing/Procedures: Your physician has requested that you have an Limited Echocardiogram. Echocardiography is a painless test that uses sound waves to create images of your heart. It provides your doctor with information about the size and shape of your heart and how well your heart's chambers and valves are working.   You may receive an ultrasound enhancing agent through an IV if needed to better visualize your heart during the echo. This procedure takes approximately one hour.  There are no restrictions for this procedure.  This will take place at [address] [address], [place] [address]    Follow-Up: At [facility], you and your health needs are our priority.  As part of our continuing mission to provide you with exceptional heart care, we have created designated Provider Care Teams.  These Care Teams include your primary Cardiologist (physician) and Advanced Practice Providers (APPs -  Physician Assistants and Nurse Practitioners) who all work together to provide you with the care you need, when you need it.  We recommend signing up for the patient portal called "MyChart".  Sign up information is provided on this After Visit Summary.  MyChart is used to connect with patients for Virtual Visits (Telemedicine).  Patients are able to view lab/test results, encounter notes, upcoming  appointments, etc.  Non-urgent messages can be sent to your provider as well.   To learn more about what you can do with MyChart, go to [email or web address].    Your next appointment:   5 month(s)  Provider:   You may see Yvonne Kendall, MD or one of the following Advanced Practice Providers on your designated Care Team:   Nicolasa Ducking, NP Eula Listen, PA-C Cadence Fransico Michael, PA-C Charlsie Quest, NP

## 2023-03-25 NOTE — Progress Notes (Unsigned)
Follow-up Outpatient Visit Date: 03/25/2023  Primary Care Provider: Lauro Regulus, MD [address] [facility] [place] [place] [address]  Chief Complaint: Follow-up coronary artery disease  HPI:  Tammie Smith is a 68 y.o. female with history of coronary artery disease with NSTEMI in 08/2022 post staged multivessel PCI, hyperlipidemia, type 2 diabetes mellitus, carotid artery disease, degenerative disc disease, and esophagitis, who presents for follow-up of coronary artery disease.  She was last seen in the office in December by Terrilee Croak, [place], at which time she was feeling well.  Atorvastatin was increased due to suboptimal LDL control.  Follow-up lipid panel earlier this month showed improved LDL from 113->76.  Today, Tammie Smith reports that she is feeling fairly well though she is still recovering from a recent sinus infection.  She is also bothered by seasonal allergies.  This has prompted her to take Excedrin on a regular basis.  She does not take her baby aspirin on days that she uses Excedrin.  She has not had any chest pain but complains of some intermittent discomfort in her left flank.  This is not reminiscent of what she experienced at the time of her NSTEMI in September.  She has not had any shortness of breath, palpitations, lightheadedness, or edema.  She has completed cardiac rehab and continues to exercise regularly.   --------------------------------------------------------------------------------------------------  Past Medical History:  Diagnosis Date   Borderline glaucoma    Cancer    skin cancer basal cell   Coronary artery disease 08/16/2022   NSTEMI s/p PCI to OM1   Degenerative disc disease, lumbar    Gestational diabetes    Hyperlipemia    Right-sided carotid artery disease    echo done   Past Surgical History:  Procedure Laterality Date   ABDOMINAL HYSTERECTOMY     APPENDECTOMY     AUGMENTATION MAMMAPLASTY Bilateral 04/05/1984    Pt had implants removed last time   CHOLECYSTECTOMY     COLONOSCOPY WITH PROPOFOL N/A 01/12/2016   Procedure: COLONOSCOPY WITH PROPOFOL;  Surgeon: Christena Deem, MD;  Location: [facility] ENDOSCOPY;  Service: Endoscopy;  Laterality: N/A;   CORONARY STENT INTERVENTION N/A 08/17/2022   Procedure: CORONARY STENT INTERVENTION;  Surgeon: Yvonne Kendall, MD;  Location: [facility] INVASIVE CV LAB;  Service: Cardiovascular;  Laterality: N/A;   CORONARY STENT INTERVENTION N/A 09/13/2022   Procedure: CORONARY STENT INTERVENTION;  Surgeon: Yvonne Kendall, MD;  Location: [facility] INVASIVE CV LAB;  Service: Cardiovascular;  Laterality: N/A;   CORONARY ULTRASOUND/IVUS N/A 09/13/2022   Procedure: Intravascular Ultrasound/IVUS;  Surgeon: Yvonne Kendall, MD;  Location: [facility] INVASIVE CV LAB;  Service: Cardiovascular;  Laterality: N/A;   ESOPHAGOGASTRODUODENOSCOPY (EGD) WITH PROPOFOL N/A 09/18/2019   Procedure: ESOPHAGOGASTRODUODENOSCOPY (EGD) WITH PROPOFOL;  Surgeon: Christena Deem, MD;  Location: [facility] ENDOSCOPY;  Service: Endoscopy;  Laterality: N/A;   LEFT HEART CATH AND CORONARY ANGIOGRAPHY N/A 08/17/2022   Procedure: LEFT HEART CATH AND CORONARY ANGIOGRAPHY;  Surgeon: Yvonne Kendall, MD;  Location: [facility] INVASIVE CV LAB;  Service: Cardiovascular;  Laterality: N/A;   LEFT HEART CATH AND CORONARY ANGIOGRAPHY N/A 09/13/2022   Procedure: LEFT HEART CATH AND CORONARY ANGIOGRAPHY;  Surgeon: Yvonne Kendall, MD;  Location: [facility] INVASIVE CV LAB;  Service: Cardiovascular;  Laterality: N/A;    Current Meds  Medication Sig   aspirin EC 81 MG tablet Take 1 tablet (81 mg total) by mouth daily. Swallow whole.   atorvastatin (LIPITOR) 80 MG tablet Take 1 tablet (80 mg total) by mouth  daily.   DULoxetine (CYMBALTA) 30 MG capsule Take 30 mg by mouth in the morning.   isosorbide mononitrate (IMDUR) 30 MG 24 hr tablet Take 0.5 tablets (15 mg total) by mouth daily.   metFORMIN (GLUCOPHAGE-XR) 500 MG 24 hr tablet Take 1 tablet (500 mg  total) by mouth daily with supper.   nitroGLYCERIN (NITROSTAT) 0.4 MG SL tablet Place 1 tablet (0.4 mg total) under the tongue every 5 (five) minutes as needed for chest pain.   propranolol (INDERAL) 40 MG tablet Take 40 mg by mouth 2 (two) times daily.   ticagrelor (BRILINTA) 90 MG TABS tablet Take 1 tablet (90 mg total) by mouth 2 (two) times daily.    Allergies: Demerol [meperidine], Codeine, Crestor [rosuvastatin], and Prilosec [omeprazole]  Social History   Tobacco Use   Smoking status: Never   Smokeless tobacco: Never  Vaping Use   Vaping Use: Never used  Substance Use Topics   Alcohol use: No   Drug use: Never    Family History  Problem Relation Age of Onset   Breast cancer Neg Hx     Review of Systems: A 12-system review of systems was performed and was negative except as noted in the HPI.  --------------------------------------------------------------------------------------------------  Physical Exam: BP 106/76 (BP Location: Left Arm, Patient Position: Sitting, Cuff Size: Normal)   Pulse 80   Ht  (1.6 m)   Wt 143 lb (64.9 kg)   SpO2 99%   BMI 25.33 kg/m   General:  NAD. Neck: No JVD or HJR. Lungs: Clear to auscultation bilaterally without wheezes or crackles. Heart: Regular rate and rhythm without murmurs, rubs, or gallops. Abdomen: Soft, nontender, nondistended. Extremities: No lower extremity edema.  EKG: Normal sinus rhythm with possible anterior infarct.  Poor R wave progression more pronounced than on prior tracing from 09/27/2022, which may reflect lead placement.  Otherwise, no significant interval change.  Lab Results  Component Value Date   WBC 5.0 11/11/2022   HGB 13.1 11/11/2022   HCT 38.8 11/11/2022   MCV 89 11/11/2022   PLT 312 11/11/2022    Lab Results  Component Value Date   NA 138 08/18/2022   K 4.2 08/18/2022   CL 109 08/18/2022   CO2 22 08/18/2022   BUN 14 08/18/2022   CREATININE 0.75 08/18/2022   GLUCOSE 131 (H)  08/18/2022   ALT 42 (H) 03/16/2023    Lab Results  Component Value Date   CHOL 138 03/16/2023   HDL 41 03/16/2023   LDLCALC 76 03/16/2023   TRIG 116 03/16/2023   CHOLHDL 3.4 03/16/2023    --------------------------------------------------------------------------------------------------  ASSESSMENT AND PLAN: Coronary artery disease with stable angina and hyperlipidemia: No angina reported.  Will plan to continue 12 months of dual antiplatelet therapy from the time of her initial event in 08/2022 at which time Tammie Smith underwent PCI to occluded OM branch.  She subsequently underwent staged PCI to high-grade LAD disease and has continued to do well.  Given recent lipid panel with LDL still above goal at 76, we have agreed to add ezetimibe 10 mg daily to her current regimen of atorvastatin 80 mg daily.  Will plan to repeat hepatic function panel and lipid panel in about 6 weeks.  Continue propranolol and isosorbide mononitrate for antianginal therapy.  Ischemic cardiomyopathy: Tammie Smith appears euvolemic and is without heart failure symptoms.  Last echo at the time of her NSTEMI showed preserved LVEF but OM territory wall motion abnormality.  We have agreed to repeat  an echo to ensure that her LVEF is stable/improved.  If there has been a decline in LVEF, we will need to consider switching propranolol to an evidence-based beta-blocker and also discussed challenging her with an ACE inhibitor or ARB if her blood pressure allows.  Follow-up: Return to clinic in early September.  Yvonne Kendall, MD 03/25/2023 8:44 AM

## 2023-03-26 ENCOUNTER — Encounter: Payer: Self-pay | Admitting: Internal Medicine

## 2023-03-26 DIAGNOSIS — I255 Ischemic cardiomyopathy: Secondary | ICD-10-CM | POA: Insufficient documentation

## 2023-04-05 ENCOUNTER — Telehealth: Payer: Self-pay | Admitting: *Deleted

## 2023-04-05 LAB — LIPID PANEL
Chol/HDL Ratio: 3 ratio (ref 0.0–4.4)
Cholesterol, Total: 107 mg/dL (ref 100–199)
HDL: 36 mg/dL — ABNORMAL LOW (ref >39)
LDL Chol Calc (NIH): 50 mg/dL (ref 0–99)
Triglycerides: 115 mg/dL (ref 0–149)
VLDL Cholesterol Cal: 21 mg/dL (ref 5–40)

## 2023-04-05 LAB — HEPATIC FUNCTION PANEL
ALT: 31 IU/L (ref 0–32)
AST: 21 IU/L (ref 0–40)
Albumin: 4.2 g/dL (ref 3.9–4.9)
Alkaline Phosphatase: 70 IU/L (ref 44–121)
Bilirubin Total: 0.3 mg/dL (ref 0.0–1.2)
Bilirubin, Direct: 0.11 mg/dL (ref 0.00–0.40)
Total Protein: 6.1 g/dL (ref 6.0–8.5)

## 2023-04-05 NOTE — Telephone Encounter (Signed)
Since Tammie Smith did not tolerate ezetimibe, I recommend that we recheck a lipid panel in about 3 months.  If LDL is back above goal, we will need to consider other strategies to help improve her cholesterol.  Tammie Kendall, MD [facility]

## 2023-04-05 NOTE — Telephone Encounter (Signed)
-----   Message from Yvonne Kendall, MD sent at 04/05/2023  1:58 PM EDT ----- Please let Ms. Bruneau know that her cholesterol has improved and that her LDL is now at goal.  Liver function tests are normal.  She should continue her current medications and follow-up as previously arranged.

## 2023-04-05 NOTE — Telephone Encounter (Signed)
Patient has been made aware of results. She stated that she stopped the Zetia last week due to "spasms" in her liver.   She took the Zetia for 6 days.

## 2023-04-06 NOTE — Telephone Encounter (Signed)
Patient has been made aware. She will come back in 3 months for labs.

## 2023-04-20 ENCOUNTER — Encounter: Payer: Self-pay | Admitting: Internal Medicine

## 2023-04-26 ENCOUNTER — Ambulatory Visit: Payer: Managed Care, Other (non HMO) | Attending: Internal Medicine

## 2023-04-26 DIAGNOSIS — I255 Ischemic cardiomyopathy: Secondary | ICD-10-CM | POA: Diagnosis not present

## 2023-04-27 LAB — ECHOCARDIOGRAM LIMITED
Area-P 1/2: 3.12 cm2
Calc EF: 52.8 %
S' Lateral: 2.7 cm
Single Plane A2C EF: 51.9 %
Single Plane A4C EF: 54.5 %

## 2023-05-03 IMAGING — MG MM DIGITAL SCREENING BILAT W/ TOMO AND CAD
6 of 10 series · 6 of 30 positions shown · non-contrast
Comparison: Previous exam(s).

CLINICAL DATA: Screening.

EXAM:
DIGITAL SCREENING BILATERAL MAMMOGRAM WITH TOMOSYNTHESIS AND CAD
TECHNIQUE: Bilateral screening digital craniocaudal and mediolateral oblique
mammograms were obtained. Bilateral screening digital breast
tomosynthesis was performed. The images were evaluated with
computer-aided detection.

[R MLO synth-2D]
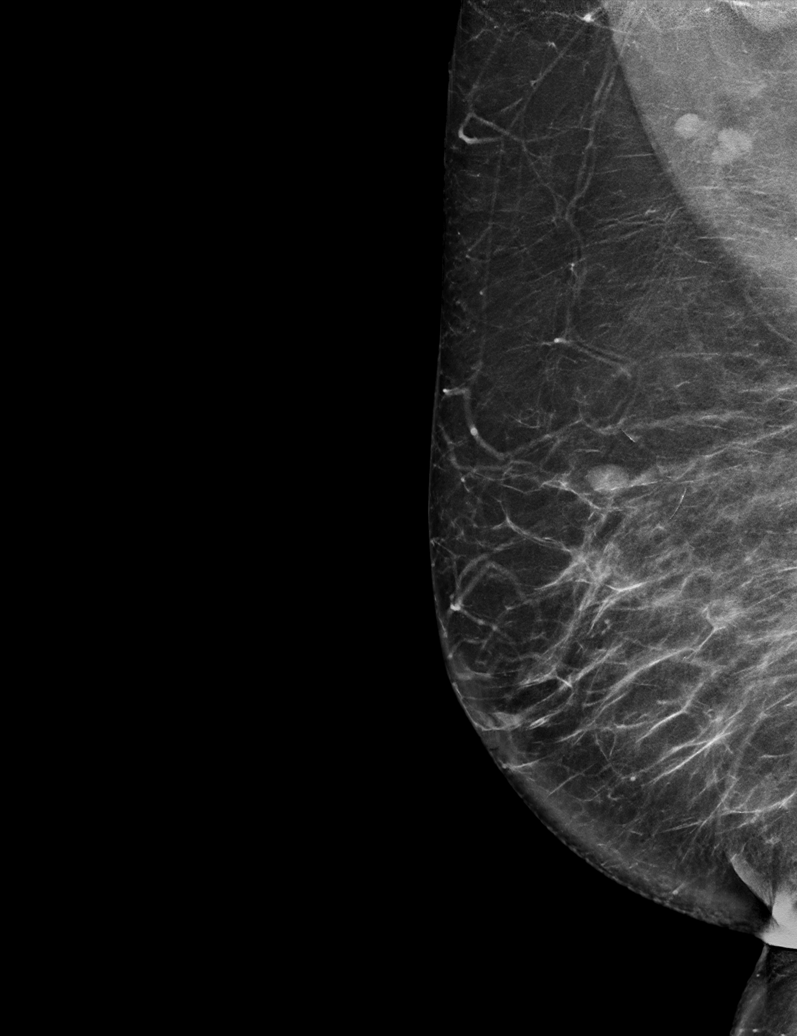

[L CC synth-2D]
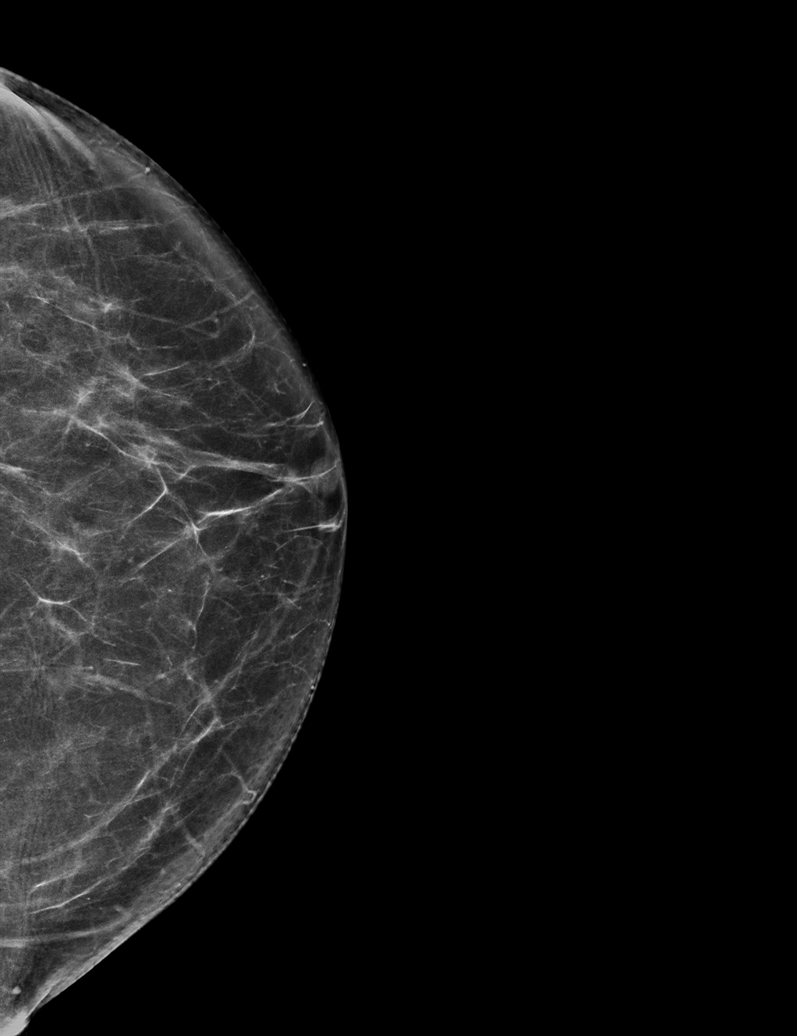

[L MLO synth-2D (1 of 2)]
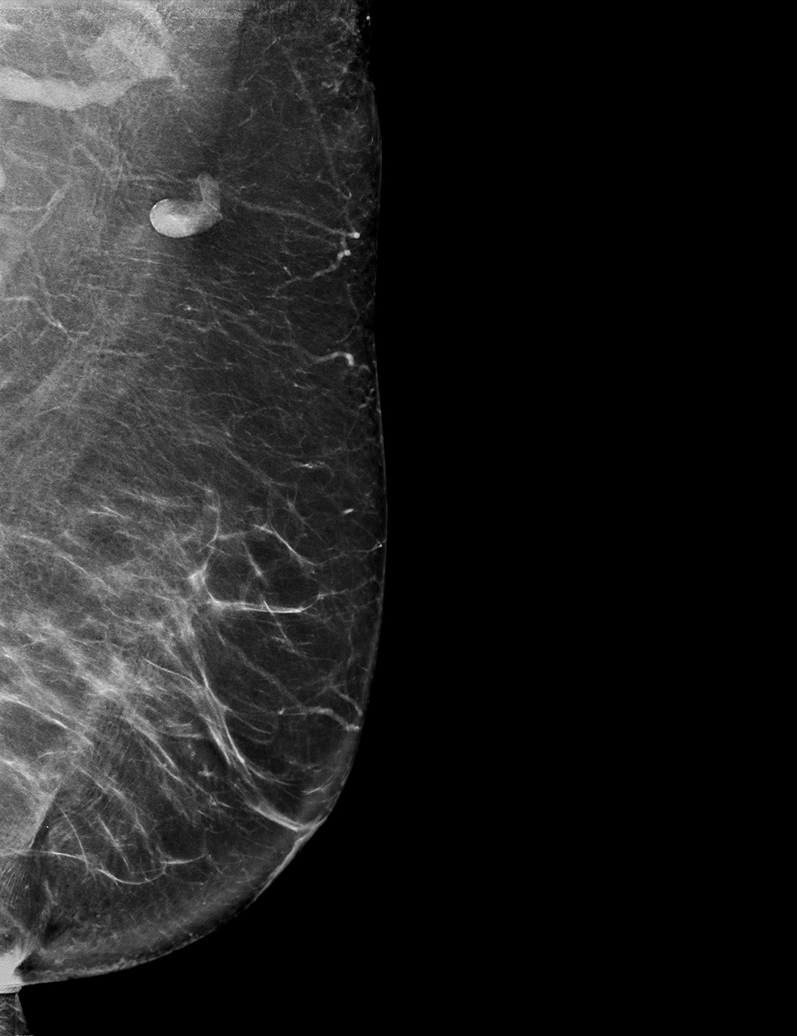

[L MLO synth-2D (2 of 2)]
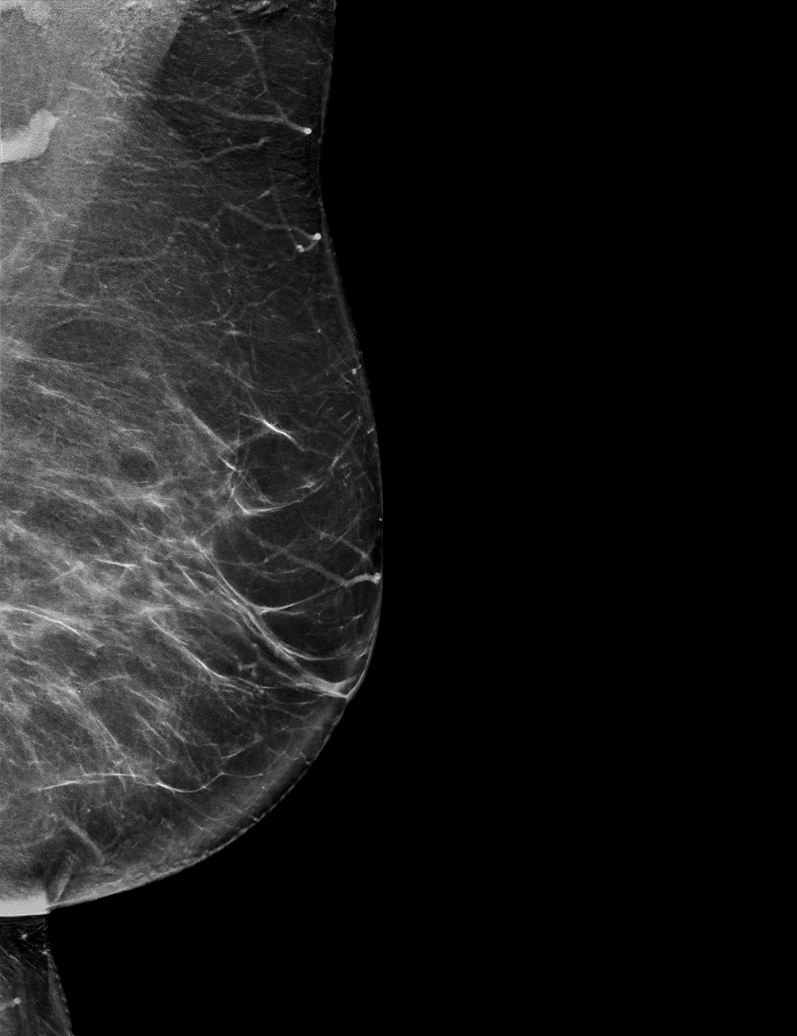

[R CC synth-2D]
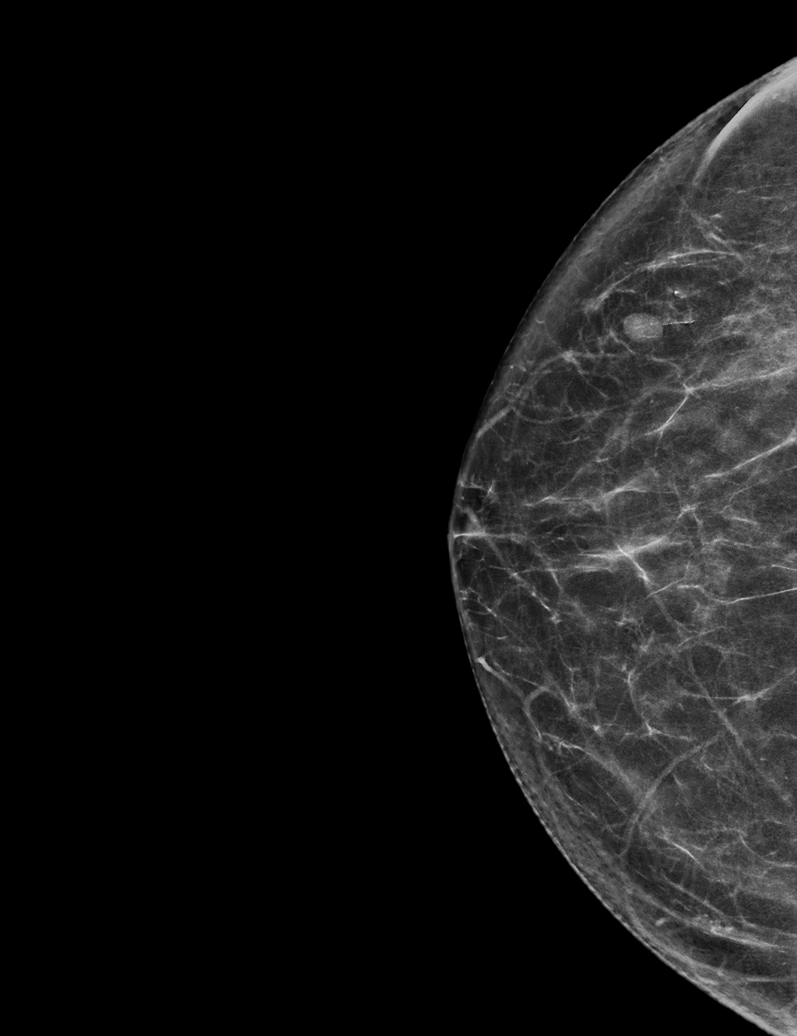

[L MLO tomo · tomo slice 40/79.0]
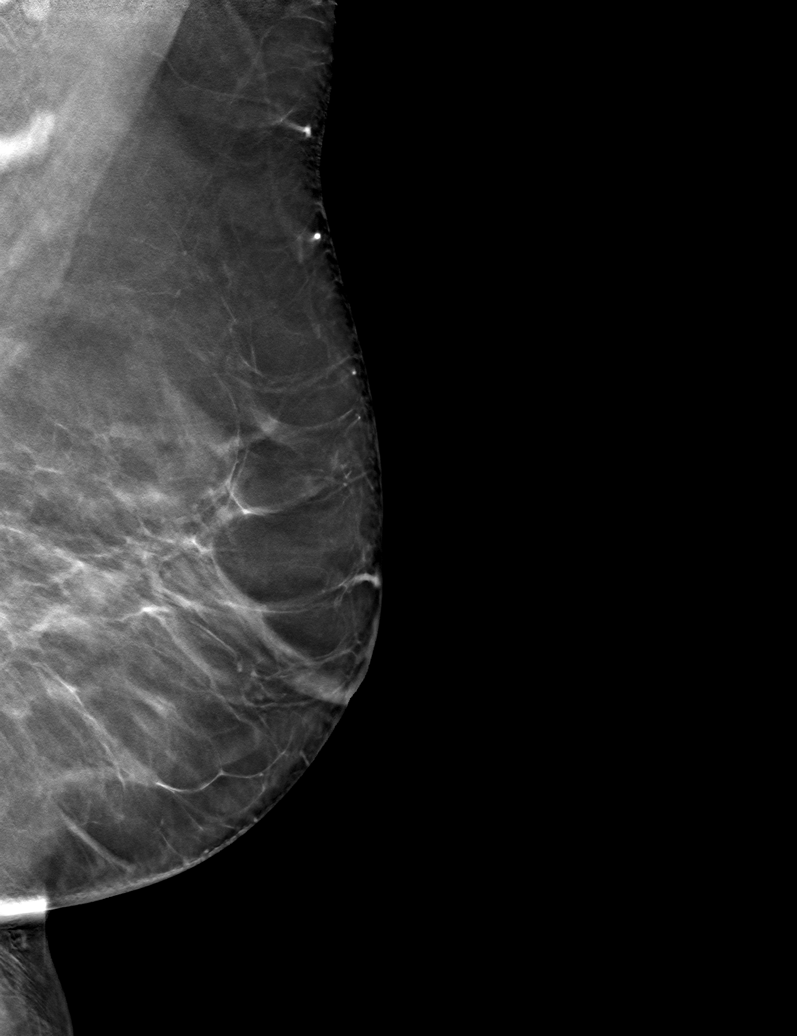

[6 of 30 positions shown; findings below may reference images not displayed]

ACR Breast Density Category b: There are scattered areas of
fibroglandular density.
FINDINGS: There are no findings suspicious for malignancy.
IMPRESSION: No mammographic evidence of malignancy. A result letter of this
screening mammogram will be mailed directly to the patient.

RECOMMENDATION:
Screening mammogram in one year. (Code:51-O-LD2)

BI-RADS CATEGORY  1: Negative.

## 2023-07-21 ENCOUNTER — Other Ambulatory Visit: Payer: Self-pay

## 2023-07-21 ENCOUNTER — Other Ambulatory Visit: Payer: Self-pay | Admitting: Internal Medicine

## 2023-07-21 DIAGNOSIS — Z79899 Other long term (current) drug therapy: Secondary | ICD-10-CM

## 2023-08-09 LAB — LIPID PANEL
Chol/HDL Ratio: 3.3 ratio (ref 0.0–4.4)
Cholesterol, Total: 140 mg/dL (ref 100–199)
HDL: 43 mg/dL (ref >39)
LDL Chol Calc (NIH): 76 mg/dL (ref 0–99)
Triglycerides: 115 mg/dL (ref 0–149)
VLDL Cholesterol Cal: 21 mg/dL (ref 5–40)

## 2023-08-11 ENCOUNTER — Ambulatory Visit: Payer: Managed Care, Other (non HMO) | Admitting: Gastroenterology

## 2023-08-11 ENCOUNTER — Encounter: Payer: Self-pay | Admitting: Gastroenterology

## 2023-08-11 VITALS — BP 132/81 | HR 69 | Temp 98.5°F | Ht 63.0 in | Wt 144.0 lb

## 2023-08-11 DIAGNOSIS — K219 Gastro-esophageal reflux disease without esophagitis: Secondary | ICD-10-CM | POA: Diagnosis not present

## 2023-08-11 DIAGNOSIS — R0789 Other chest pain: Secondary | ICD-10-CM

## 2023-08-11 MED ORDER — PANTOPRAZOLE SODIUM 40 MG PO TBEC: 40.0000 mg | DELAYED_RELEASE_TABLET | Freq: Every day | ORAL | 3 refills | Status: DC

## 2023-08-11 NOTE — Progress Notes (Signed)
Primary Care Physician: Lauro Regulus, MD  Primary Gastroenterologist:  Dr. Midge Minium  Chief Complaint  Patient presents with   Cough    dry    HPI: Tammie Smith is a 67 y.o. female here with a history of heartburn and has been treated with Aciphex in the past.  The patient reports that last year she had 3 stents placed in her heart after having a heart attack.  The patient also lost her husband around that same time.  She reports that she stopped her rabeprazole because she ran out of it and started to have some chest discomfort and would take her nitrates given to her by her cardiologist but they gave her severe headaches and she already has a history of headaches.  The patient states that she has been avoiding taking that medication and thinks that her symptoms may be related to her GI tract since she is no longer taking a PPI.  She also states that when she was on the Aciphex she would have to intermittently take Pepcid to supplement her acid suppression.  Past Medical History:  Diagnosis Date   Borderline glaucoma    Cancer (HCC)    skin cancer basal cell   Coronary artery disease 08/16/2022   NSTEMI s/p PCI to OM1   Degenerative disc disease, lumbar    Gestational diabetes    Hyperlipemia    Right-sided carotid artery disease (HCC)    echo done    Current Outpatient Medications  Medication Sig Dispense Refill   aspirin EC 81 MG tablet Take 1 tablet (81 mg total) by mouth daily. Swallow whole. 90 tablet 3   atorvastatin (LIPITOR) 80 MG tablet TAKE 1 TABLET(80 MG) BY MOUTH DAILY 90 tablet 0   ezetimibe (ZETIA) 10 MG tablet Take 1 tablet (10 mg total) by mouth daily. 90 tablet 3   JARDIANCE 10 MG TABS tablet Take 10 mg by mouth daily.     nitroGLYCERIN (NITROSTAT) 0.4 MG SL tablet Place 1 tablet (0.4 mg total) under the tongue every 5 (five) minutes as needed for chest pain. 90 tablet 3   propranolol (INDERAL) 40 MG tablet Take 40 mg by mouth 2 (two) times daily.      RABEprazole (ACIPHEX) 20 MG tablet Take 20 mg by mouth daily.     ticagrelor (BRILINTA) 90 MG TABS tablet Take 1 tablet (90 mg total) by mouth 2 (two) times daily. 180 tablet 3   DULoxetine (CYMBALTA) 30 MG capsule Take 30 mg by mouth in the morning. (Patient not taking: Reported on 08/11/2023)     isosorbide mononitrate (IMDUR) 30 MG 24 hr tablet Take 0.5 tablets (15 mg total) by mouth daily. (Patient not taking: Reported on 08/11/2023) 45 tablet 3   No current facility-administered medications for this visit.    Allergies as of 08/11/2023 - Review Complete 08/11/2023  Allergen Reaction Noted   Demerol [meperidine] Nausea And Vomiting 12/12/2015   Codeine Nausea And Vomiting 12/05/2018   Crestor [rosuvastatin]  09/17/2019   Prilosec [omeprazole]  09/17/2019    ROS:  General: Negative for anorexia, weight loss, fever, chills, fatigue, weakness. ENT: Negative for hoarseness, difficulty swallowing , nasal congestion. CV: Negative for chest pain, angina, palpitations, dyspnea on exertion, peripheral edema.  Respiratory: Negative for dyspnea at rest, dyspnea on exertion, cough, sputum, wheezing.  GI: See history of present illness. GU:  Negative for dysuria, hematuria, urinary incontinence, urinary frequency, nocturnal urination.  Endo: Negative for unusual weight change.  Physical Examination:   BP 132/81 (BP Location: Left Arm, Patient Position: Sitting, Cuff Size: Normal)   Pulse 69   Temp 98.5 F (36.9 C) (Oral)   Ht 5\' 3"  (1.6 m)   Wt 144 lb (65.3 kg)   BMI 25.51 kg/m   General: Well-nourished, well-developed in no acute distress.  Eyes: No icterus. Conjunctivae pink. Neuro: Alert and oriented x 3.  Grossly intact. Psych: Alert and cooperative, normal mood and affect.  Labs:    Imaging Studies: No results found.  Assessment and Plan:   Tammie Smith is a 68 y.o. y/o female who comes in today with a history of chest discomfort but also has a history of reflux.   She states that she had 3 stents placed a year ago and is not doing well with nitrates due to the side effect of headaches.  The patient denies any dysphagia to solid foods although she does state that at one point she drank water and it came back up and came out of her nose which scared her.  The patient will be started on pantoprazole 40 mg once a day since she was having acid breakthrough on the Aciphex.  The patient will also let me know if her chest pains improve and if they do not on the pantoprazole that she has been instructed to talk to her cardiologist because her chest pain/discomfort may be from her heart.  The patient has been explained the plan and agrees with it.   Midge Minium, MD. Clementeen Graham    Note: This dictation was prepared with Dragon dictation along with smaller phrase technology. Any transcriptional errors that result from this process are unintentional.

## 2023-08-17 ENCOUNTER — Ambulatory Visit: Payer: Managed Care, Other (non HMO) | Admitting: Gastroenterology

## 2023-08-25 ENCOUNTER — Ambulatory Visit: Payer: Managed Care, Other (non HMO) | Admitting: Internal Medicine

## 2023-10-08 ENCOUNTER — Other Ambulatory Visit (HOSPITAL_BASED_OUTPATIENT_CLINIC_OR_DEPARTMENT_OTHER): Payer: Self-pay | Admitting: Internal Medicine

## 2023-10-16 NOTE — Progress Notes (Unsigned)
  Cardiology Office Note:  .   Date:  10/17/2023  ID:  Tammie Smith, DOB Apr 11, 1956, MRN [id number] PCP: Lauro Regulus, MD  [facility] Providers Cardiologist:  Yvonne Kendall, MD { Click to update primary MD,subspecialty MD or APP then REFRESH:1}    History of Present Illness: .   Tammie Smith is a 67 y.o. female with history of coronary artery disease with NSTEMI in 08/2022 post staged multivessel PCI, hyperlipidemia, type 2 diabetes mellitus, carotid artery disease, degenerative disc disease, and esophagitis, who presents for follow-up of coronary artery disease.  I last saw her in April, at which time she was recovering from a recent sinus infection but was otherwise feeling well.  Due to suboptimal lipids with LDL of 76 on high intensity statin therapy, we elected to add ezetimibe 10 mg daily, though she was intolerant due to "spasms" in her liver.  Repeat lipid panel in September showed that her LDL was still above goal.  ROS: See HPI  Studies Reviewed: Marland Kitchen   EKG Interpretation Date/Time:  Monday October 17 2023 15:10:46 EST Ventricular Rate:  81 PR Interval:  168 QRS Duration:  72 QT Interval:  372 QTC Calculation: 432 R Axis:   -4  Text Interpretation: Normal sinus rhythm Cannot rule out Anterior infarct , age undetermined Low voltage QRS Abnormal ECG When compared with ECG of 25-Mar-2023 No significant change was found Confirmed by Sirena Riddle, Cristal Deer [phone]) on 10/17/2023 3:14:47 PM    TTE (04/26/2023): Normal LV size with mild LVH.  LVEF 60 to 65% with normal wall motion and grade 1 diastolic dysfunction.  Normal RV size and function.  Normal biatrial size.  No pericardial effusion.  No significant valvular abnormality.  Normal CVP.  Risk Assessment/Calculations:   {Does this patient have ATRIAL FIBRILLATION?:[id number]}         Physical Exam:   VS:  BP 115/68 (BP Location: Left Arm, Patient Position: Sitting, Cuff Size: Normal)   Pulse 81   Ht 5\' 3"  (1.6  m)   Wt 146 lb 6.4 oz (66.4 kg)   SpO2 96%   BMI 25.93 kg/m    Wt Readings from Last 3 Encounters:  10/17/23 146 lb 6.4 oz (66.4 kg)  08/11/23 144 lb (65.3 kg)  03/25/23 143 lb (64.9 kg)    General:  NAD. Neck: No JVD or HJR. Lungs: Clear to auscultation bilaterally without wheezes or crackles. Heart: Regular rate and rhythm without murmurs, rubs, or gallops. Abdomen: Soft, nontender, nondistended. Extremities: No lower extremity edema.  ASSESSMENT AND PLAN: .    ***    {Are you ordering a CV Procedure (e.g. stress test, cath, DCCV, TEE, etc)?   Press F2        :[id number]}  Dispo: ***  Signed, Yvonne Kendall, MD

## 2023-10-17 ENCOUNTER — Encounter: Payer: Self-pay | Admitting: Internal Medicine

## 2023-10-17 ENCOUNTER — Ambulatory Visit: Payer: Managed Care, Other (non HMO) | Attending: Internal Medicine | Admitting: Internal Medicine

## 2023-10-17 VITALS — BP 115/68 | HR 81 | Ht 63.0 in | Wt 146.4 lb

## 2023-10-17 DIAGNOSIS — E785 Hyperlipidemia, unspecified: Secondary | ICD-10-CM

## 2023-10-17 DIAGNOSIS — I25118 Atherosclerotic heart disease of native coronary artery with other forms of angina pectoris: Secondary | ICD-10-CM

## 2023-10-17 DIAGNOSIS — E1169 Type 2 diabetes mellitus with other specified complication: Secondary | ICD-10-CM

## 2023-10-17 DIAGNOSIS — I255 Ischemic cardiomyopathy: Secondary | ICD-10-CM | POA: Diagnosis not present

## 2023-10-17 MED ORDER — ATORVASTATIN CALCIUM 80 MG PO TABS: 80.0000 mg | ORAL_TABLET | Freq: Every day | ORAL | 3 refills | Status: DC

## 2023-10-17 MED ORDER — METOPROLOL SUCCINATE ER 25 MG PO TB24: 25.0000 mg | ORAL_TABLET | Freq: Every day | ORAL | 3 refills | Status: DC

## 2023-10-17 NOTE — Patient Instructions (Signed)
Medication Instructions:  Your physician recommends the following medication changes.  STOP TAKING: Imdur daily  START TAKING: Metoprolol Succinate 25 mg by mouth daily  RESTART TAKING: Aspirin 81 mg by mouth daily  *If you need a refill on your cardiac medications before your next appointment, please call your pharmacy*   Lab Work: No labs ordered today    Testing/Procedures: No test ordered today    Follow-Up: At [facility], you and your health needs are our priority.  As part of our continuing mission to provide you with exceptional heart care, we have created designated Provider Care Teams.  These Care Teams include your primary Cardiologist (physician) and Advanced Practice Providers (APPs -  Physician Assistants and Nurse Practitioners) who all work together to provide you with the care you need, when you need it.  We recommend signing up for the patient portal called "MyChart".  Sign up information is provided on this After Visit Summary.  MyChart is used to connect with patients for Virtual Visits (Telemedicine).  Patients are able to view lab/test results, encounter notes, upcoming appointments, etc.  Non-urgent messages can be sent to your provider as well.   To learn more about what you can do with MyChart, go to [email or web address].    Your next appointment:   1 month(s)  Provider:   You will see one of the following Advanced Practice Providers on your designated Care Team:   Nicolasa Ducking, NP Eula Listen, PA-C Cadence Fransico Michael, PA-C Charlsie Quest, NP Carlos Levering, NP

## 2023-10-18 ENCOUNTER — Encounter: Payer: Self-pay | Admitting: Internal Medicine

## 2023-10-19 ENCOUNTER — Telehealth: Payer: Self-pay | Admitting: Internal Medicine

## 2023-10-19 MED ORDER — PROPRANOLOL HCL 80 MG PO TABS: 80.0000 mg | ORAL_TABLET | Freq: Two times a day (BID) | ORAL | 5 refills | Status: DC

## 2023-10-19 NOTE — Telephone Encounter (Signed)
I spoke with Ms. Peer regarding metoprolol and propranolol for BP and antianginal therapy.  We have agreed to stop metoprolol ordered yesterday and increase propranolol to 80 mg BID.  Yvonne Kendall, MD [facility]

## 2023-10-19 NOTE — Telephone Encounter (Signed)
Patient seen yesterday and started on metoprolol succinate for antianginal therapy given headaches with isosorbide mononitrate.  After completion of visit, it was noted that patient has already been on propranolol.  I attempted to reach the patient by phone to discuss which beta-blocker to use but did not receive an answer.  I have left a message on her voicemail for her to call back to the office so that we can discuss appropriate pharmacotherapy, as she does not need to be on 2 different beta-blockers.  Yvonne Kendall, MD [facility]

## 2023-10-24 ENCOUNTER — Other Ambulatory Visit: Payer: Self-pay | Admitting: Obstetrics and Gynecology

## 2023-10-24 DIAGNOSIS — Z1231 Encounter for screening mammogram for malignant neoplasm of breast: Secondary | ICD-10-CM

## 2023-11-07 ENCOUNTER — Telehealth: Payer: Self-pay | Admitting: Cardiovascular Disease

## 2023-11-07 MED ORDER — PROPRANOLOL HCL 80 MG PO TABS: 80.0000 mg | ORAL_TABLET | Freq: Two times a day (BID) | ORAL | 0 refills | Status: DC

## 2023-11-07 NOTE — Telephone Encounter (Signed)
Requested Prescriptions   Signed Prescriptions Disp Refills   propranolol (INDERAL) 80 MG tablet 180 tablet 0    Sig: Take 1 tablet (80 mg total) by mouth 2 (two) times daily.    Authorizing Provider: END, CHRISTOPHER    Ordering User: Kendrick Fries

## 2023-11-07 NOTE — Telephone Encounter (Signed)
*  STAT* If patient is at the pharmacy, call can be transferred to refill team.   1. Which medications need to be refilled? (please list name of each medication and dose if known)  propranolol (INDERAL) 80 MG tablet  2. Which pharmacy/location (including street and city if local pharmacy) is medication to be sent to? [id number] - [place], [place] - [address] AT [address]EC OF [address]    3. Do they need a 30 day or 90 day supply?   Patient states when her medication was increased her prescription was initially in for a 30 day supply instead of 90. She would like to have it increased to 90 and a call back to confirm if possible.

## 2023-11-16 ENCOUNTER — Ambulatory Visit: Payer: Managed Care, Other (non HMO) | Attending: Medical | Admitting: Medical

## 2023-11-16 ENCOUNTER — Encounter: Payer: Self-pay | Admitting: Medical

## 2023-11-16 VITALS — BP 124/78 | HR 75 | Ht 63.0 in | Wt 146.2 lb

## 2023-11-16 DIAGNOSIS — I255 Ischemic cardiomyopathy: Secondary | ICD-10-CM | POA: Diagnosis not present

## 2023-11-16 DIAGNOSIS — E1169 Type 2 diabetes mellitus with other specified complication: Secondary | ICD-10-CM

## 2023-11-16 DIAGNOSIS — I25118 Atherosclerotic heart disease of native coronary artery with other forms of angina pectoris: Secondary | ICD-10-CM | POA: Diagnosis not present

## 2023-11-16 DIAGNOSIS — E785 Hyperlipidemia, unspecified: Secondary | ICD-10-CM

## 2023-11-16 NOTE — Patient Instructions (Signed)
Medication Instructions:  - No changes *If you need a refill on your cardiac medications before your next appointment, please call your pharmacy*  Lab Work: - None ordered  Testing/Procedures: - None ordered  Follow-Up: At [facility], you and your health needs are our priority.  As part of our continuing mission to provide you with exceptional heart care, we have created designated Provider Care Teams.  These Care Teams include your primary Cardiologist (physician) and Advanced Practice Providers (APPs -  Physician Assistants and Nurse Practitioners) who all work together to provide you with the care you need, when you need it.  Your next appointment:   3 month(s)  Provider:   Terrilee Croak, PA-C

## 2023-11-16 NOTE — Progress Notes (Signed)
Cardiology Office Note:    Date:  11/16/2023   ID:  Tammie Smith, DOB October 28, 1956, MRN [id number]  PCP:  Lauro Regulus, MD  [facility] Cardiologist:  Yvonne Kendall, MD  [facility] Electrophysiologist:  None   Referring MD: Lauro Regulus, MD   Chief Complaint: 1 month follow-up  History of Present Illness:    Tammie Smith is a 67 y.o. female with a hx of CAD with NSTEMI in 08/2022 s/p staged multivessel PCI, HLD, DM2, carotid artery disease, degenerative disc disease, esophagitis who presents for follow-up.   The patient was last seen 10/2023 reporting headaches and chest pressure. She was going back and forth with taking Imdur. Imdur was stopped and metoprolol was started. He decided to not start metoprolol and increases propranolol instead.  Today, the patient reports chest pain, fatigue, and DOE. She is very stressed at work and has barely slept in the last week. GI started her on PPI. Chest pain is in the center of the chest and goes to the back. SOB with exertion. Also with dizziness. Chest pain is not worse when she walks. IT has been constant for a couple days. Increasing BB has not helped.   Past Medical History:  Diagnosis Date   Borderline glaucoma    Cancer (HCC)    skin cancer basal cell   Coronary artery disease 08/16/2022   NSTEMI s/p PCI to OM1   Degenerative disc disease, lumbar    Gestational diabetes    Hyperlipemia    Right-sided carotid artery disease (HCC)    echo done    Past Surgical History:  Procedure Laterality Date   ABDOMINAL HYSTERECTOMY     APPENDECTOMY     AUGMENTATION MAMMAPLASTY Bilateral 04/05/1984   Pt had implants removed last time   CHOLECYSTECTOMY     COLONOSCOPY WITH PROPOFOL N/A 01/12/2016   Procedure: COLONOSCOPY WITH PROPOFOL;  Surgeon: Christena Deem, MD;  Location: [facility] ENDOSCOPY;  Service: Endoscopy;  Laterality: N/A;   CORONARY STENT INTERVENTION N/A 08/17/2022   Procedure: CORONARY STENT  INTERVENTION;  Surgeon: Yvonne Kendall, MD;  Location: [facility] INVASIVE CV LAB;  Service: Cardiovascular;  Laterality: N/A;   CORONARY STENT INTERVENTION N/A 09/13/2022   Procedure: CORONARY STENT INTERVENTION;  Surgeon: Yvonne Kendall, MD;  Location: [facility] INVASIVE CV LAB;  Service: Cardiovascular;  Laterality: N/A;   CORONARY ULTRASOUND/IVUS N/A 09/13/2022   Procedure: Intravascular Ultrasound/IVUS;  Surgeon: Yvonne Kendall, MD;  Location: [facility] INVASIVE CV LAB;  Service: Cardiovascular;  Laterality: N/A;   ESOPHAGOGASTRODUODENOSCOPY (EGD) WITH PROPOFOL N/A 09/18/2019   Procedure: ESOPHAGOGASTRODUODENOSCOPY (EGD) WITH PROPOFOL;  Surgeon: Christena Deem, MD;  Location: [facility] ENDOSCOPY;  Service: Endoscopy;  Laterality: N/A;   LEFT HEART CATH AND CORONARY ANGIOGRAPHY N/A 08/17/2022   Procedure: LEFT HEART CATH AND CORONARY ANGIOGRAPHY;  Surgeon: Yvonne Kendall, MD;  Location: [facility] INVASIVE CV LAB;  Service: Cardiovascular;  Laterality: N/A;   LEFT HEART CATH AND CORONARY ANGIOGRAPHY N/A 09/13/2022   Procedure: LEFT HEART CATH AND CORONARY ANGIOGRAPHY;  Surgeon: Yvonne Kendall, MD;  Location: [facility] INVASIVE CV LAB;  Service: Cardiovascular;  Laterality: N/A;    Current Medications: Current Meds  Medication Sig   aspirin EC 81 MG tablet Take 1 tablet (81 mg total) by mouth daily. Swallow whole.   Aspirin-Acetaminophen-Caffeine (EXCEDRIN EXTRA STRENGTH PO) Take by mouth.   atorvastatin (LIPITOR) 80 MG tablet Take 1 tablet (80 mg total) by mouth daily.   DULoxetine (CYMBALTA) 30 MG capsule Take 30 mg by mouth in  the morning.   JARDIANCE 10 MG TABS tablet Take 25 mg by mouth daily. Total 25 mg   nitroGLYCERIN (NITROSTAT) 0.4 MG SL tablet Place 1 tablet (0.4 mg total) under the tongue every 5 (five) minutes as needed for chest pain.   pantoprazole (PROTONIX) 40 MG tablet Take 1 tablet (40 mg total) by mouth daily.   propranolol (INDERAL) 80 MG tablet Take 1 tablet (80 mg total) by mouth 2 (two) times  daily.   ticagrelor (BRILINTA) 90 MG TABS tablet TAKE 1 TABLET(90 MG) BY MOUTH TWICE DAILY     Allergies:   Demerol [meperidine], Codeine, Crestor [rosuvastatin], and Prilosec [omeprazole]   Social History   Socioeconomic History   Marital status: Widowed    Spouse name: Not on file   Number of children: Not on file   Years of education: Not on file   Highest education level: Not on file  Occupational History   Not on file  Tobacco Use   Smoking status: Never   Smokeless tobacco: Never  Vaping Use   Vaping status: Never Used  Substance and Sexual Activity   Alcohol use: No   Drug use: Never   Sexual activity: Not Currently  Other Topics Concern   Not on file  Social History Narrative   Not on file   Social Determinants of Health   Financial Resource Strain: Low Risk  (10/13/2023)   Received from [facility]   Overall Financial Resource Strain (CARDIA)    Difficulty of Paying Living Expenses: Not hard at all  Food Insecurity: No Food Insecurity (10/13/2023)   Received from [facility]   Hunger Vital Sign    Worried About Running Out of Food in the Last Year: Never true    Ran Out of Food in the Last Year: Never true  Transportation Needs: No Transportation Needs (10/13/2023)   Received from [facility] - Transportation    In the past 12 months, has lack of transportation kept you from medical appointments or from getting medications?: No    Lack of Transportation (Non-Medical): No  Physical Activity: Not on file  Stress: Not on file  Social Connections: Not on file     Family History: The patient's family history is negative for Breast cancer.  ROS:   Please see the history of present illness.     All other systems reviewed and are negative.  EKGs/Labs/Other Studies Reviewed:    The following studies were reviewed today:  Limited echo 04/2023  1. Left ventricular ejection fraction, by estimation,  is 60 to 65%. Left  ventricular ejection fraction by PLAX is 61 %. The left ventricle has  normal function. The left ventricle has no regional wall motion  abnormalities. There is mild left ventricular  hypertrophy. Left ventricular diastolic parameters are consistent with  Grade I diastolic dysfunction (impaired relaxation).   2. Right ventricular systolic function is normal. The right ventricular  size is normal. Tricuspid regurgitation signal is inadequate for assessing  PA pressure.   3. The mitral valve is normal in structure. No evidence of mitral valve  regurgitation. No evidence of mitral stenosis.   4. The aortic valve is tricuspid. Aortic valve regurgitation is mild. No  aortic stenosis is present.   5. The inferior vena cava is normal in size with greater than 50%  respiratory variability, suggesting right atrial pressure of 3 mmHg.   Coronary stent intervention 09/2022 Conclusions: Severe LAD disease with  sequential 50%, 80-90%, and 50% mid to distal vessel lesions as well as 90% stenosis involving small D3 branch. Widely patent overlapping OM1 stents. Normal left ventricular filling pressure (LVEDP 14 mmHg). Successful IVUS-guided PCI to 80-90% mid LAD stenosis using Synergy 2.75 x 20 mm drug-eluting stent (postdilated to 3.1 mm) with 0% residual stenosis and TIMI-3 flow.   Recommendations: Continue dual antiplatelet therapy with aspirin and ticagrelor for at least 12 months from recent NSTEMI. Decrease isosorbide mononitrate to 15 mg daily given significant headaches at higher doses. Aggressive secondary prevention of coronary artery disease. Anticipate same-day discharge if no complications during 6-hour post-PCI monitoring period.   Yvonne Kendall, MD [facility]      [facility] 08/2022 Conclusions: Severe three-vessel coronary artery disease, including long segment of calcified mid LAD disease of up to 80%, 50% distal LAD stenosis, subtotal occlusion (99% with TIMI-1  flow) of OM1, and 70% proximal stenosis of small, non-dominant RCA. Mildly elevated left ventricular filling pressure (LVEDP 17 mmHg). Successful PCI to OM1 using overlapping Onyx Frontier 2.25 x 22 mm and 2.0 x 12 mm drug-eluting stents with 0% residual stenosis and TIMI-3.  Initial stent placement was complicated by distal edge dissection successful treated with overlapping stent at the distal margin.   Recommendations: Dual antiplatelet therapy with aspirin and ticagrelor for at least 12 months. Aggressive secondary prevention.  We have agreed to rechallenge Ms. Yardley with statin therapy. Plan for medical management of LAD disease with further discussion of staged PCI to be done at follow-up.  We will continue recently added propranolol and also start isosorbide mononitrate. Outpatient cardiac rehab referral has been placed.   Yvonne Kendall, MD [facility]  Echo 08/2022  1. Left ventricular ejection fraction, by estimation, is 55 to 60%. The  left ventricle has normal function. The left ventricle demonstrates  regional wall motion abnormalities (see scoring diagram/findings for  description). There is mild left ventricular   hypertrophy. Left ventricular diastolic parameters are consistent with  Grade I diastolic dysfunction (impaired relaxation). Elevated left atrial  pressure. There is moderate hypokinesis of the left ventricular, basal-mid  inferolateral wall.   2. Right ventricular systolic function is normal. The right ventricular  size is normal. Tricuspid regurgitation signal is inadequate for assessing  PA pressure.   3. The mitral valve is normal in structure. Trivial mitral valve  regurgitation.   4. The aortic valve is tricuspid. There is mild thickening of the aortic  valve. Aortic valve regurgitation is mild.   5. The inferior vena cava is normal in size with <50% respiratory  variability, suggesting right atrial pressure of 8 mmHg.   EKG:  EKG is ordered today.   The ekg ordered today demonstrates NSR 75bpm, nonspecific ST changes  Recent Labs: 04/04/2023: ALT 31  Recent Lipid Panel    Component Value Date/Time   CHOL 140 08/09/2023 0820   TRIG 115 08/09/2023 0820   HDL 43 08/09/2023 0820   CHOLHDL 3.3 08/09/2023 0820   CHOLHDL 6.7 08/18/2022 0717   VLDL 44 (H) 08/18/2022 0717   LDLCALC 76 08/09/2023 0820   Physical Exam:    VS:  BP 124/78 (BP Location: Left Arm, Patient Position: Sitting, Cuff Size: Normal)   Pulse 75   Ht 5\' 3"  (1.6 m)   Wt 146 lb 3.2 oz (66.3 kg)   SpO2 96%   BMI 25.90 kg/m     Wt Readings from Last 3 Encounters:  11/16/23 146 lb 3.2 oz (66.3 kg)  10/17/23 146  lb 6.4 oz (66.4 kg)  08/11/23 144 lb (65.3 kg)     GEN:  Well nourished, well developed in no acute distress HEENT: Normal NECK: No JVD; No carotid bruits LYMPHATICS: No lymphadenopathy CARDIAC: RRR, no murmurs, rubs, gallops RESPIRATORY:  Clear to auscultation without rales, wheezing or rhonchi  ABDOMEN: Soft, non-tender, non-distended MUSCULOSKELETAL:  No edema; No deformity  SKIN: Warm and dry NEUROLOGIC:  Alert and oriented x 3 PSYCHIATRIC:  Normal affect   ASSESSMENT:    1. Coronary artery disease of native artery of native heart with stable angina pectoris (HCC)   2. Hyperlipidemia associated with type 2 diabetes mellitus (HCC)   3. Ischemic cardiomyopathy    PLAN:    In order of problems listed above:  Chest pain CAD  Patient reports chest pain in the setting of severe stress at work. She has not slept well in 5 days. Says pain is similar to prior stenting, but thinks it is mostly from the stress at work. She has occasional chest pain when she is not stressed. She did not tolerate Imdur due to headaches. She is taking propranolol with little improvement in symptoms. We discussed antianginal therapy vs stress test, but she would like to wait at this time. It has been a year since the last stent, but we will continue ASA and Brilinta in the  setting of chest pain. Continue Aspirin, Brilinta, Lipitor, SL NTG and BB therapy.   HLD with DM2 LDL 76, which is just above goal. She did not tolerate Zetia. Continue Lipitor 80mg  daily. Repatha briefly discussed.   ICM Limited echo 04/2023 showed normal LVEF. Continue propranolol.   Disposition: Follow up in 3 month(s) with MD/APP     Signed, Micai Apolinar David Stall, PA-C  11/16/2023 3:50 PM    [facility]

## 2023-11-18 ENCOUNTER — Ambulatory Visit
Admission: RE | Admit: 2023-11-18 | Discharge: 2023-11-18 | Disposition: A | Discharge status: Home / Self Care | Payer: Managed Care, Other (non HMO) | Source: Ambulatory Visit | Attending: Obstetrics and Gynecology | Admitting: Obstetrics and Gynecology

## 2023-11-18 DIAGNOSIS — Z1231 Encounter for screening mammogram for malignant neoplasm of breast: Secondary | ICD-10-CM | POA: Insufficient documentation

## 2024-01-04 ENCOUNTER — Telehealth: Payer: Self-pay

## 2024-01-04 NOTE — Telephone Encounter (Signed)
Per pt phone call wants to get a scope but wants to see an MD first. Pt feels like she is having spasm in her stomach. Pt would like  to see someone about this then have scope by Dr. Servando Snare. Pt has medication but it not working  pt is in a lot of pain.    Can I  add pt on to see Inetta Fermo?

## 2024-01-06 ENCOUNTER — Other Ambulatory Visit (HOSPITAL_BASED_OUTPATIENT_CLINIC_OR_DEPARTMENT_OTHER): Payer: Self-pay | Admitting: Internal Medicine

## 2024-01-08 NOTE — Progress Notes (Unsigned)
Celso Amy, PA-C [address]  [address]  [place], [place] [address]  Main: [phone]  Fax: [phone]   Primary Care Physician: Lauro Regulus, MD  Primary Gastroenterologist:  Celso Amy, PA-C / Dr. Midge Minium    CC:  GERD, N/V, Epigastric Pain  HPI: Tammie Smith is a 68 y.o. female returns for follow-up of GERD with esophagitis.  Currently takes pantoprazole 40 Mg once daily.  She has been taking Excedrin daily for headaches.  No other NSAIDS.   She developed acute nausea, vomiting, and diarrhea 1 week ago.  Diarrhea resolved.  Has a lot of epigastric pain radiating through to her mid back.  She vomits mucus and acid.  Has had chronic intermittent nausea for several months.  Denies melena, hematochezia, or hematemesis.  She admits breakthrough heartburn on pantoprazole once daily nightly.  Previous cholecystectomy.  She is requesting to have a repeat EGD to evaluate her upper GI symptoms.  She is on her PCP Dr. Dareen Piano this morning.  She states she had labs this morning including CBC, CMP, lipase.  Results pending.  09/2019 EGD by Dr. Marva Panda: LA grade C esophagitis without bleeding.  Small to medium hiatal hernia.  Mild erosive gastritis.  Mild erosive duodenitis.  Biopsies showed erosive gastroesophagitis.  Negative for H. pylori, Barrett's, or dysplasia.  01/2016 colonoscopy by Dr. Marva Panda: Redundant colon, diverticulosis, no polyps, good prep.  10-year repeat.  Medical history significant for CAD with NSTEMI in 08/2022 s/p staged multivessel PCI, HLD, DM2, carotid artery disease, degenerative disc disease, GERD with esophagitis.  Currently takes 81 mg aspirin daily.  No other blood thinners.  Current Outpatient Medications  Medication Sig Dispense Refill   aspirin EC 81 MG tablet Take 1 tablet (81 mg total) by mouth daily. Swallow whole. 90 tablet 3   atorvastatin (LIPITOR) 80 MG tablet Take 1 tablet (80 mg total) by mouth daily. 90 tablet 3   JARDIANCE  10 MG TABS tablet Take 25 mg by mouth daily. Total 25 mg     nitroGLYCERIN (NITROSTAT) 0.4 MG SL tablet Place 1 tablet (0.4 mg total) under the tongue every 5 (five) minutes as needed for chest pain. 90 tablet 3   ondansetron (ZOFRAN) 4 MG tablet Take 1 tablet (4 mg total) by mouth every 8 (eight) hours as needed for nausea or vomiting. 30 tablet 2   propranolol (INDERAL) 80 MG tablet Take 1 tablet (80 mg total) by mouth 2 (two) times daily. 180 tablet 0   sucralfate (CARAFATE) 1 g tablet Take 1 tablet (1 g total) by mouth 3 (three) times daily before meals. 90 tablet 2   ticagrelor (BRILINTA) 90 MG TABS tablet TAKE 1 TABLET(90 MG) BY MOUTH TWICE DAILY 180 tablet 3   pantoprazole (PROTONIX) 40 MG tablet Take 1 tablet (40 mg total) by mouth daily. 90 tablet 3   No current facility-administered medications for this visit.    Allergies as of 01/09/2024 - Review Complete 01/09/2024  Allergen Reaction Noted   Demerol [meperidine] Nausea And Vomiting 12/12/2015   Codeine Nausea And Vomiting 12/05/2018   Crestor [rosuvastatin]  09/17/2019   Prilosec [omeprazole]  09/17/2019    Past Medical History:  Diagnosis Date   Anxiety, mild 09/24/2021   Very rare xanax, 15 per 6 months     Borderline glaucoma    Cancer (HCC)    skin cancer basal cell   Coronary artery disease 08/16/2022   NSTEMI s/p PCI to OM1   Degenerative  disc disease, lumbar    Gestational diabetes    Healthcare maintenance 12/21/2017   Sees gyn, hysterectomy noted and bilateral implants and then removed.  2/17 colonoscopy noted and wohl now follows and due 2-27, shingrix 1-19 discussed and doesn't want vaccines otherwise     Hyperlipemia    Intractable migraine without status migrainosus 08/06/2022   Right-sided carotid artery disease (HCC)    echo done    Past Surgical History:  Procedure Laterality Date   ABDOMINAL HYSTERECTOMY     APPENDECTOMY     AUGMENTATION MAMMAPLASTY Bilateral 04/05/1984   Pt had implants  removed last time   CHOLECYSTECTOMY     COLONOSCOPY WITH PROPOFOL N/A 01/12/2016   Procedure: COLONOSCOPY WITH PROPOFOL;  Surgeon: Christena Deem, MD;  Location: [facility] ENDOSCOPY;  Service: Endoscopy;  Laterality: N/A;   CORONARY STENT INTERVENTION N/A 08/17/2022   Procedure: CORONARY STENT INTERVENTION;  Surgeon: Yvonne Kendall, MD;  Location: [facility] INVASIVE CV LAB;  Service: Cardiovascular;  Laterality: N/A;   CORONARY STENT INTERVENTION N/A 09/13/2022   Procedure: CORONARY STENT INTERVENTION;  Surgeon: Yvonne Kendall, MD;  Location: [facility] INVASIVE CV LAB;  Service: Cardiovascular;  Laterality: N/A;   CORONARY ULTRASOUND/IVUS N/A 09/13/2022   Procedure: Intravascular Ultrasound/IVUS;  Surgeon: Yvonne Kendall, MD;  Location: [facility] INVASIVE CV LAB;  Service: Cardiovascular;  Laterality: N/A;   ESOPHAGOGASTRODUODENOSCOPY (EGD) WITH PROPOFOL N/A 09/18/2019   Procedure: ESOPHAGOGASTRODUODENOSCOPY (EGD) WITH PROPOFOL;  Surgeon: Christena Deem, MD;  Location: [facility] ENDOSCOPY;  Service: Endoscopy;  Laterality: N/A;   LEFT HEART CATH AND CORONARY ANGIOGRAPHY N/A 08/17/2022   Procedure: LEFT HEART CATH AND CORONARY ANGIOGRAPHY;  Surgeon: Yvonne Kendall, MD;  Location: [facility] INVASIVE CV LAB;  Service: Cardiovascular;  Laterality: N/A;   LEFT HEART CATH AND CORONARY ANGIOGRAPHY N/A 09/13/2022   Procedure: LEFT HEART CATH AND CORONARY ANGIOGRAPHY;  Surgeon: Yvonne Kendall, MD;  Location: [facility] INVASIVE CV LAB;  Service: Cardiovascular;  Laterality: N/A;    Review of Systems:    All systems reviewed and negative except where noted in HPI.   Physical Examination:   BP 138/82   Pulse (!) 102   Temp 98.7 F (37.1 C)   Ht 5\' 3"  (1.6 m)   Wt 143 lb 9.6 oz (65.1 kg)   BMI 25.44 kg/m   General: Well-nourished, well-developed in no acute distress.  Lungs: Clear to auscultation bilaterally. Non-labored. Heart: Regular rate and rhythm, no murmurs rubs or gallops.  Abdomen: Bowel sounds are normal; Abdomen  is Soft; No hepatosplenomegaly, masses or hernias;  Mild to moderate Epigastric Tenderness; Rest of abdomen is not tender.  No guarding or rebound tenderness. Neuro: Alert and oriented x 3.  Grossly intact.  Psych: Alert and cooperative, normal mood and affect.   Imaging Studies: No results found.  Assessment and Plan:   Tammie Smith is a 68 y.o. y/o female presents for:  1.  GERD with LA grade C esophagitis (EGD in 2020) 2.  Erosive gastritis (H. pylori negative) 3.  Erosive duodenitis 4.  Nausea and Vomiting  5.  Epigastric Pain  Plan:  -Stop Excedrin Migraine. -Avoid all NSAIDs, list given. -Continue pantoprazole 40 Mg 1 tablet once daily 30 minutes before dinner. -Rx Carafate 1 g 1 tablet 3 times daily before meals and bedtime.  -Scheduling EGD I discussed risks of EGD with patient to include risk of bleeding, perforation, and risk of sedation.  Patient expressed understanding and agrees to proceed with EGD.   -Recommend Lifestyle Modifications to prevent Acid  Reflux.  Rec. Avoid coffee, sodas, peppermint, garlic, onions, alcohol, citrus fruits, chocolate, tomatoes, fatty and spicey foods.  Avoid eating 2-3 hours before bedtime.    Celso Amy, PA-C  Follow up after EGD.

## 2024-01-09 ENCOUNTER — Encounter: Payer: Self-pay | Admitting: Physician Assistant

## 2024-01-09 ENCOUNTER — Ambulatory Visit: Payer: Managed Care, Other (non HMO) | Admitting: Physician Assistant

## 2024-01-09 VITALS — BP 138/82 | HR 102 | Temp 98.7°F | Ht 63.0 in | Wt 143.6 lb

## 2024-01-09 DIAGNOSIS — K2100 Gastro-esophageal reflux disease with esophagitis, without bleeding: Secondary | ICD-10-CM | POA: Diagnosis not present

## 2024-01-09 DIAGNOSIS — R112 Nausea with vomiting, unspecified: Secondary | ICD-10-CM | POA: Diagnosis not present

## 2024-01-09 DIAGNOSIS — R1013 Epigastric pain: Secondary | ICD-10-CM

## 2024-01-09 DIAGNOSIS — K2970 Gastritis, unspecified, without bleeding: Secondary | ICD-10-CM

## 2024-01-09 DIAGNOSIS — K269 Duodenal ulcer, unspecified as acute or chronic, without hemorrhage or perforation: Secondary | ICD-10-CM

## 2024-01-09 DIAGNOSIS — K2960 Other gastritis without bleeding: Secondary | ICD-10-CM

## 2024-01-09 MED ORDER — ONDANSETRON HCL 4 MG PO TABS: 4.0000 mg | ORAL_TABLET | Freq: Three times a day (TID) | ORAL | 2 refills | Status: AC | PRN

## 2024-01-09 MED ORDER — SUCRALFATE 1 G PO TABS: 1.0000 g | ORAL_TABLET | Freq: Three times a day (TID) | ORAL | 2 refills | Status: DC

## 2024-01-09 MED ORDER — PANTOPRAZOLE SODIUM 40 MG PO TBEC: 40.0000 mg | DELAYED_RELEASE_TABLET | Freq: Every day | ORAL | 3 refills | Status: DC

## 2024-01-12 ENCOUNTER — Telehealth: Payer: Self-pay

## 2024-01-12 NOTE — Telephone Encounter (Signed)
 Pt lmovm stating that she would like to reschedule

## 2024-01-13 ENCOUNTER — Telehealth: Payer: Self-pay

## 2024-01-13 ENCOUNTER — Telehealth: Payer: Self-pay | Admitting: Internal Medicine

## 2024-01-13 NOTE — Telephone Encounter (Signed)
   Pre-operative Risk Assessment    Patient Name: Tammie Smith  DOB: 01/16/1956 MRN: [id number]   Date of last office visit: 11/16/23 JAYSON Fishman Date of next office visit: 02/16/24 JAYSON Fishman   Request for Surgical Clearance    Procedure:   EGD  Date of Surgery:  Clearance 02/07/24                                Surgeon:  not indicated Surgeon's Group or Practice Name:  [facility] Phone number:  [phone] Fax number:  [phone]   Type of Clearance Requested:   - Pharmacy:  Hold Ticagrelor  (Brilinta ) instructions   Type of Anesthesia:  None    Additional requests/questions:    Bonney Rosina Stamps   01/13/2024, 9:43 AM

## 2024-01-13 NOTE — Telephone Encounter (Signed)
 Spoke with patient rescheduled EGD to 02-07-24 due to daughter's schedule. Mailed instructions today.Spoke with [facility] unit and had patient moved.sent Brilinta  clearance to Dr.End.

## 2024-01-13 NOTE — Telephone Encounter (Signed)
 Tammie Smith 68 year old female is requesting preoperative cardiac evaluation for EGD.  Procedure is scheduled for 02/07/2024.  She was last seen in the clinic on 11/16/2023.  She reported chest pain, fatigue, and DOE.  She noted increased stress at work and was working with GI.  Follow-up was planned for 3 months.  Her PMH includes coronary artery disease with NSTEMI 9/23.  HLD, type 2 diabetes, carotid artery disease, degenerative disc disease, and esophagitis.  Echocardiogram 5/24 showed LVEF of 60 to 65%, G1 DD, and no significant valvular abnormalities.  Would you be able to comment on cardiac risk for upcoming procedure?  May her Brilinta  be held prior to her EGD?  Thank you for your help.  Please direct your response to CV DIV preop pool.  Josefa HERO. Antara Brecheisen NP-C     01/13/2024, 2:57 PM [facility] [address] Office [phone] Fax [phone]

## 2024-01-16 NOTE — Telephone Encounter (Signed)
 Dr. Nolan Battle, patient has overlapping stents in OM1. Could you please comment on recommendations regarding holding Brilinta  for upcoming EGD?  Please send your response to p cv div preop.  Thank you, Moira Andrews

## 2024-01-17 NOTE — Telephone Encounter (Signed)
Spoke with patient who reports she continues to have midsternal chest pain and is uncertain whether it is GI or cardiac. She is scheduled to see Cadence Furth, PA on 01/24/24 for further evaluation.

## 2024-01-17 NOTE — Telephone Encounter (Signed)
It appears that Ms. Livolsi complained of intermittent chest pain at her last visit with Cadence Furth, [place], in 11/2023.  She did not wish to proceed with stress testing at that time.  If she continues to have intermittent chest pain, I recommend that we reevaluate her in the office and moved forward with noninvasive ischemia testing before any elective procedures are performed.  Further recommendations regarding her antiplatelet therapy can be made at that time.  Yvonne Kendall, MD [facility]

## 2024-01-17 NOTE — Telephone Encounter (Signed)
Called patient to discuss concerning cardiac symptoms at last office visit. Left message for patient to call back.

## 2024-01-24 ENCOUNTER — Ambulatory Visit: Payer: Managed Care, Other (non HMO) | Attending: Medical | Admitting: Medical

## 2024-01-24 ENCOUNTER — Encounter: Payer: Self-pay | Admitting: Medical

## 2024-01-24 VITALS — BP 137/80 | HR 88 | Ht 63.0 in | Wt 148.2 lb

## 2024-01-24 DIAGNOSIS — I25118 Atherosclerotic heart disease of native coronary artery with other forms of angina pectoris: Secondary | ICD-10-CM

## 2024-01-24 DIAGNOSIS — Z01818 Encounter for other preprocedural examination: Secondary | ICD-10-CM | POA: Diagnosis not present

## 2024-01-24 DIAGNOSIS — E785 Hyperlipidemia, unspecified: Secondary | ICD-10-CM

## 2024-01-24 DIAGNOSIS — E1169 Type 2 diabetes mellitus with other specified complication: Secondary | ICD-10-CM | POA: Diagnosis not present

## 2024-01-24 DIAGNOSIS — I255 Ischemic cardiomyopathy: Secondary | ICD-10-CM | POA: Diagnosis not present

## 2024-01-24 NOTE — Progress Notes (Signed)
 Cardiology Office Note:  .   Date:  01/24/2024  ID:  Tammie Smith, DOB [birth year], MRN [id number] PCP: Lauro Regulus, MD  [facility] Providers Cardiologist:  Yvonne Kendall, MD {    History of Present Illness: .   Tammie Smith is a 68 y.o. female with a h/o CAD with NSTEMI in 08/2022 s/p staged multivessel PCI, HLD, DM2, carotid artery disease, degenerative disc disease, esophagitis who presents for pre-op evaluation for EGD.  The patient was last seen 11/16/23 reporting chest pain, fatigue and DOE in the setting of stress from work. No further work-up was pursued.  Today, the patient reports she has been having severe GI issues. 3 weeks ago she woke up in the morning with severe vomiting. They thought she had food poisoning and is needing an EGD. She has chest pain and SOB and is wondering if a lot of  the symptoms are from gastric issues. She stopped taking all her meds because she was so sick. She restarted all her meds except Brilinta, ASA and Jardiance. Shes/p 1 year stenting since 09/2022. She went to walk yesterday and had chest pain and DOE. Its right sided and radiates into the back. She also reports lightheadedness and dizziness that is better since stopping Jardiance. Her overall function is good. She can walk 1.5 miles and up flights of stairs.     Studies Reviewed: Marland Kitchen   EKG Interpretation Date/Time:  Tuesday January 24 2024 08:15:44 EST Ventricular Rate:  88 PR Interval:  162 QRS Duration:  72 QT Interval:  360 QTC Calculation: 435 R Axis:   2  Text Interpretation: Normal sinus rhythm Normal ECG When compared with ECG of 16-Nov-2023 13:56, No significant change was found Confirmed by Fransico Michael, Kenzly Rogoff ([id number]) on 01/24/2024 8:27:08 AM    Limited echo 04/2023  1. Left ventricular ejection fraction, by estimation, is 60 to 65%. Left  ventricular ejection fraction by PLAX is 61 %. The left ventricle has  normal function. The left ventricle has no regional wall  motion  abnormalities. There is mild left ventricular  hypertrophy. Left ventricular diastolic parameters are consistent with  Grade I diastolic dysfunction (impaired relaxation).   2. Right ventricular systolic function is normal. The right ventricular  size is normal. Tricuspid regurgitation signal is inadequate for assessing  PA pressure.   3. The mitral valve is normal in structure. No evidence of mitral valve  regurgitation. No evidence of mitral stenosis.   4. The aortic valve is tricuspid. Aortic valve regurgitation is mild. No  aortic stenosis is present.   5. The inferior vena cava is normal in size with greater than 50%  respiratory variability, suggesting right atrial pressure of 3 mmHg.    Coronary stent intervention 09/2022 Conclusions: Severe LAD disease with sequential 50%, 80-90%, and 50% mid to distal vessel lesions as well as 90% stenosis involving small D3 branch. Widely patent overlapping OM1 stents. Normal left ventricular filling pressure (LVEDP 14 mmHg). Successful IVUS-guided PCI to 80-90% mid LAD stenosis using Synergy 2.75 x 20 mm drug-eluting stent (postdilated to 3.1 mm) with 0% residual stenosis and TIMI-3 flow.   Recommendations: Continue dual antiplatelet therapy with aspirin and ticagrelor for at least 12 months from recent NSTEMI. Decrease isosorbide mononitrate to 15 mg daily given significant headaches at higher doses. Aggressive secondary prevention of coronary artery disease. Anticipate same-day discharge if no complications during 6-hour post-PCI monitoring period.   Yvonne Kendall, MD [facility]  [facility] 08/2022 Conclusions: Severe three-vessel coronary artery disease, including long segment of calcified mid LAD disease of up to 80%, 50% distal LAD stenosis, subtotal occlusion (99% with TIMI-1 flow) of OM1, and 70% proximal stenosis of small, non-dominant RCA. Mildly elevated left ventricular filling pressure (LVEDP 17 mmHg). Successful  PCI to OM1 using overlapping Onyx Frontier 2.25 x 22 mm and 2.0 x 12 mm drug-eluting stents with 0% residual stenosis and TIMI-3.  Initial stent placement was complicated by distal edge dissection successful treated with overlapping stent at the distal margin.   Recommendations: Dual antiplatelet therapy with aspirin and ticagrelor for at least 12 months. Aggressive secondary prevention.  We have agreed to rechallenge Ms. Hildebran with statin therapy. Plan for medical management of LAD disease with further discussion of staged PCI to be done at follow-up.  We will continue recently added propranolol and also start isosorbide mononitrate. Outpatient cardiac rehab referral has been placed.   Yvonne Kendall, MD [facility]   Echo 08/2022  1. Left ventricular ejection fraction, by estimation, is 55 to 60%. The  left ventricle has normal function. The left ventricle demonstrates  regional wall motion abnormalities (see scoring diagram/findings for  description). There is mild left ventricular   hypertrophy. Left ventricular diastolic parameters are consistent with  Grade I diastolic dysfunction (impaired relaxation). Elevated left atrial  pressure. There is moderate hypokinesis of the left ventricular, basal-mid  inferolateral wall.   2. Right ventricular systolic function is normal. The right ventricular  size is normal. Tricuspid regurgitation signal is inadequate for assessing  PA pressure.   3. The mitral valve is normal in structure. Trivial mitral valve  regurgitation.   4. The aortic valve is tricuspid. There is mild thickening of the aortic  valve. Aortic valve regurgitation is mild.   5. The inferior vena cava is normal in size with <50% respiratory  variability, suggesting right atrial pressure of 8 mmHg.      Physical Exam:   VS:  BP 137/80 (BP Location: Left Arm, Patient Position: Sitting, Cuff Size: Normal)   Pulse 88   Ht 5\' 3"  (1.6 m)   Wt 148 lb 3.2 oz (67.2 kg)    SpO2 98%   BMI 26.25 kg/m    Wt Readings from Last 3 Encounters:  01/24/24 148 lb 3.2 oz (67.2 kg)  01/09/24 143 lb 9.6 oz (65.1 kg)  11/16/23 146 lb 3.2 oz (66.3 kg)    GEN: Well nourished, well developed in no acute distress NECK: No JVD; No carotid bruits CARDIAC: RRR, no murmurs, rubs, gallops RESPIRATORY:  Clear to auscultation without rales, wheezing or rhonchi  ABDOMEN: Soft, non-tender, non-distended EXTREMITIES:  No edema; No deformity   ASSESSMENT AND PLAN: .    Chest pain  CAD s/p PCI/DES (overlapping stents) OM1 08/2022 and staged PCI/DES mid LAD 09/2022 Patient reports persistent right sided chest pain that radiates to the back as well as exertional chest pain and DOE. She stopped Brilinta and ASA 3 weeks ago during severe vomiting episode. It has been over 1 year since cardiac stenting. I recommend she restart ASA 81mg  daily. I will order a Cardiac PET stress test as part of pre-op evaluation. Continue Lipitor 80mg  daily, Propranolol 80mg  daily, and SL NTG.   HLD with DM2 LDL 76. Continue Lipitor 80mg  daily. She did not tolerate Zetia.   ICM Limited Echo 04/2023 showed LVEF 60-65%, no WMA, mild LVH, G1DD, mild AI. She is euvolemic on exam. She stopped Jardiance d/t GI issues.  Continue BB therapy.  Pre-op for EGD She had an episode of severe nasuea and vomiting and is scheduled for EGD March 4th. Due to chest pain and DOE plan for Cardiac PET stress as above. EKG is non-ischemic. She stopped all her meds during N/V. She since restarted all her meds except ASA, Brilinta and Jardiance. OK to stay off Brilinta since she is over 1 year cardiac stenting. I recommend she restart ASA 81mg  daily. EKG shows NSR with no ischemic changes. METS>4. According to RCRI she is 6% risk of MACE. If stress test is low risk, can proceed with EGD.  Regarding ASA therapy, we recommend continuation of ASA throughout the perioperative period.  However, if the surgeon feels that cessation of ASA is  required in the perioperative period, it may be stopped 5-7 days prior to surgery with a plan to resume it as soon as felt to be feasible from a surgical standpoint in the post-operative period.   Informed Consent   Shared Decision Making/Informed Consent The risks [chest pain, shortness of breath, cardiac arrhythmias, dizziness, blood pressure fluctuations, myocardial infarction, stroke/transient ischemic attack, nausea, vomiting, allergic reaction, radiation exposure, metallic taste sensation and life-threatening complications (estimated to be 1 in 10,000)], benefits (risk stratification, diagnosing coronary artery disease, treatment guidance) and alternatives of a cardiac PET stress test were discussed in detail with Ms. Barillas and she agrees to proceed.     Dispo: Follow-up in 3 months  Signed, Analyce Tavares David Stall, PA-C

## 2024-01-24 NOTE — Patient Instructions (Signed)
 Medication Instructions:  Your physician recommends that you continue on your current medications as directed. Please refer to the Current Medication list given to you today.   *If you need a refill on your cardiac medications before your next appointment, please call your pharmacy*   Lab Work: No labs ordered today    Testing/Procedures: Cardiac PET  Please see instructions below   Follow-Up: At [facility], you and your health needs are our priority.  As part of our continuing mission to provide you with exceptional heart care, we have created designated Provider Care Teams.  These Care Teams include your primary Cardiologist (physician) and Advanced Practice Providers (APPs -  Physician Assistants and Nurse Practitioners) who all work together to provide you with the care you need, when you need it.  We recommend signing up for the patient portal called "MyChart".  Sign up information is provided on this After Visit Summary.  MyChart is used to connect with patients for Virtual Visits (Telemedicine).  Patients are able to view lab/test results, encounter notes, upcoming appointments, etc.  Non-urgent messages can be sent to your provider as well.   To learn more about what you can do with MyChart, go to [email or web address].    Your next appointment:   3 month(s)  Provider:   You may see Yvonne Kendall, MD or one of the following Advanced Practice Providers on your designated Care Team:   Nicolasa Ducking, NP Eula Listen, PA-C Cadence Fransico Michael, PA-C Charlsie Quest, NP Carlos Levering, NP       Please report to Radiology at the [facility] Main Entrance 30 minutes early for your test.  [address] [place], [place] [address]                         OR   Please report to Radiology at [facility] Main Entrance, medical mall, 30 mins prior to your test.  [address]  [place], [place]  How to Prepare for Your Cardiac  PET/CT Stress Test:  Nothing to eat or drink, except water, 3 hours prior to arrival time.  NO caffeine/decaffeinated products, or chocolate 12 hours prior to arrival. (Please note decaffeinated beverages (teas/coffees) still contain caffeine).  If you have caffeine within 12 hours prior, the test will need to be rescheduled.  Medication instructions: Do not take erectile dysfunction medications for 72 hours prior to test (sildenafil, tadalafil) Do not take nitrates (isosorbide mononitrate, Ranexa) the day before or day of test Do not take tamsulosin the day before or morning of test Hold theophylline containing medications for 12 hours. Hold Dipyridamole 48 hours prior to the test.  Diabetic Preparation: If able to eat breakfast prior to 3 hour fasting, you may take all medications, including your insulin. Do not worry if you miss your breakfast dose of insulin - start at your next meal. If you do not eat prior to 3 hour fast-Hold all diabetes (oral and insulin) medications. Patients who wear a continuous glucose monitor MUST remove the device prior to scanning.  You may take your remaining medications with water.  NO perfume, cologne or lotion on chest or abdomen area. FEMALES - Please avoid wearing dresses to this appointment.  Total time is 1 to 2 hours; you may want to bring reading material for the waiting time.  IF YOU THINK YOU MAY BE PREGNANT, OR ARE NURSING PLEASE INFORM THE TECHNOLOGIST.  In preparation for your appointment,  medication and supplies will be purchased.  Appointment availability is limited, so if you need to cancel or reschedule, please call the Radiology Department Scheduler at [phone] 24 hours in advance to avoid a cancellation fee of $100.00  What to Expect When you Arrive:  Once you arrive and check in for your appointment, you will be taken to a preparation room within the Radiology Department.  A technologist or Nurse will obtain your medical history,  verify that you are correctly prepped for the exam, and explain the procedure.  Afterwards, an IV will be started in your arm and electrodes will be placed on your skin for EKG monitoring during the stress portion of the exam. Then you will be escorted to the PET/CT scanner.  There, staff will get you positioned on the scanner and obtain a blood pressure and EKG.  During the exam, you will continue to be connected to the EKG and blood pressure machines.  A small, safe amount of a radioactive tracer will be injected in your IV to obtain a series of pictures of your heart along with an injection of a stress agent.    After your Exam:  It is recommended that you eat a meal and drink a caffeinated beverage to counter act any effects of the stress agent.  Drink plenty of fluids for the remainder of the day and urinate frequently for the first couple of hours after the exam.  Your doctor will inform you of your test results within 7-10 business days.  For more information and frequently asked questions, please visit our website: [email or web address]  For questions about your test or how to prepare for your test, please call: Cardiac Imaging Nurse Navigators Office: [phone]

## 2024-02-07 ENCOUNTER — Encounter: Payer: Self-pay | Admitting: Gastroenterology

## 2024-02-07 ENCOUNTER — Encounter
Admission: RE | Disposition: A | Discharge status: Home / Self Care | Payer: Self-pay | Source: Home / Self Care | Attending: Gastroenterology

## 2024-02-07 ENCOUNTER — Ambulatory Visit
Admission: RE | Admit: 2024-02-07 | Discharge: 2024-02-07 | Disposition: A | Discharge status: Home / Self Care | Payer: Managed Care, Other (non HMO) | Attending: Gastroenterology | Admitting: Gastroenterology

## 2024-02-07 ENCOUNTER — Ambulatory Visit: Admitting: Anesthesiology

## 2024-02-07 ENCOUNTER — Other Ambulatory Visit: Payer: Self-pay

## 2024-02-07 DIAGNOSIS — K2100 Gastro-esophageal reflux disease with esophagitis, without bleeding: Secondary | ICD-10-CM

## 2024-02-07 DIAGNOSIS — K449 Diaphragmatic hernia without obstruction or gangrene: Secondary | ICD-10-CM | POA: Diagnosis not present

## 2024-02-07 DIAGNOSIS — I2510 Atherosclerotic heart disease of native coronary artery without angina pectoris: Secondary | ICD-10-CM | POA: Insufficient documentation

## 2024-02-07 DIAGNOSIS — Z955 Presence of coronary angioplasty implant and graft: Secondary | ICD-10-CM | POA: Insufficient documentation

## 2024-02-07 DIAGNOSIS — K219 Gastro-esophageal reflux disease without esophagitis: Secondary | ICD-10-CM | POA: Diagnosis not present

## 2024-02-07 DIAGNOSIS — I252 Old myocardial infarction: Secondary | ICD-10-CM | POA: Insufficient documentation

## 2024-02-07 DIAGNOSIS — K2960 Other gastritis without bleeding: Secondary | ICD-10-CM

## 2024-02-07 DIAGNOSIS — R0789 Other chest pain: Secondary | ICD-10-CM

## 2024-02-07 DIAGNOSIS — E119 Type 2 diabetes mellitus without complications: Secondary | ICD-10-CM | POA: Insufficient documentation

## 2024-02-07 DIAGNOSIS — K2970 Gastritis, unspecified, without bleeding: Secondary | ICD-10-CM

## 2024-02-07 DIAGNOSIS — K31A19 Gastric intestinal metaplasia without dysplasia, unspecified site: Secondary | ICD-10-CM | POA: Diagnosis not present

## 2024-02-07 HISTORY — PX: ESOPHAGOGASTRODUODENOSCOPY (EGD) WITH PROPOFOL: SHX5813

## 2024-02-07 LAB — GLUCOSE, CAPILLARY: Glucose-Capillary: 150 mg/dL — ABNORMAL HIGH (ref 70–99)

## 2024-02-07 SURGERY — ESOPHAGOGASTRODUODENOSCOPY (EGD) WITH PROPOFOL
Anesthesia: General

## 2024-02-07 MED ORDER — PROPOFOL 10 MG/ML IV BOLUS
INTRAVENOUS | Status: DC | PRN
  Administered 2024-02-07: 180 mg via INTRAVENOUS

## 2024-02-07 MED ORDER — PROPOFOL 10 MG/ML IV BOLUS
INTRAVENOUS | Status: AC
  Filled 2024-02-07: qty 40

## 2024-02-07 MED ORDER — SODIUM CHLORIDE 0.9 % IV SOLN
INTRAVENOUS | Status: DC
Start: 2024-02-07 — End: 2024-02-07

## 2024-02-07 MED ORDER — GLYCOPYRROLATE 0.2 MG/ML IJ SOLN
INTRAMUSCULAR | Status: DC | PRN
  Administered 2024-02-07: .2 mg via INTRAVENOUS

## 2024-02-07 MED ORDER — DEXMEDETOMIDINE HCL IN NACL 80 MCG/20ML IV SOLN
INTRAVENOUS | Status: DC | PRN
  Administered 2024-02-07: 4 ug via INTRAVENOUS

## 2024-02-07 MED ORDER — GLYCOPYRROLATE 0.2 MG/ML IJ SOLN
INTRAMUSCULAR | Status: AC
  Filled 2024-02-07: qty 1

## 2024-02-07 MED ORDER — LIDOCAINE HCL (PF) 2 % IJ SOLN
INTRAMUSCULAR | Status: AC
  Filled 2024-02-07: qty 5

## 2024-02-07 MED ORDER — LIDOCAINE HCL (CARDIAC) PF 100 MG/5ML IV SOSY
PREFILLED_SYRINGE | INTRAVENOUS | Status: DC | PRN
  Administered 2024-02-07: 100 mg via INTRAVENOUS

## 2024-02-07 NOTE — Anesthesia Preprocedure Evaluation (Addendum)
 Anesthesia Evaluation  Patient identified by MRN, date of birth, ID band Patient awake    Reviewed: Allergy & Precautions, NPO status , Patient's Chart, lab work & pertinent test results  History of Anesthesia Complications Negative for: history of anesthetic complications  Airway Mallampati: I   Neck ROM: Full    Dental no notable dental hx.    Pulmonary neg pulmonary ROS   Pulmonary exam normal breath sounds clear to auscultation       Cardiovascular + CAD (s/p MI and stents)  Normal cardiovascular exam Rhythm:Regular Rate:Normal  ECG 01/24/24: normal   Neuro/Psych  Headaches PSYCHIATRIC DISORDERS Anxiety        GI/Hepatic ,GERD  ,,  Endo/Other  diabetes, Type 2    Renal/GU negative Renal ROS     Musculoskeletal  (+) Arthritis ,    Abdominal   Peds  Hematology negative hematology ROS (+)   Anesthesia Other Findings Cardiology note 01/24/24:  Chest pain  CAD s/p PCI/DES (overlapping stents) OM1 08/2022 and staged PCI/DES mid LAD 09/2022 Patient reports persistent right sided chest pain that radiates to the back as well as exertional chest pain and DOE. She stopped Brilinta and ASA 3 weeks ago during severe vomiting episode. It has been over 1 year since cardiac stenting. I recommend she restart ASA 81mg  daily. I will order a Cardiac PET stress test as part of pre-op evaluation. Continue Lipitor 80mg  daily, Propranolol 80mg  daily, and SL NTG.    HLD with DM2 LDL 76. Continue Lipitor 80mg  daily. She did not tolerate Zetia.    ICM Limited Echo 04/2023 showed LVEF 60-65%, no WMA, mild LVH, G1DD, mild AI. She is euvolemic on exam. She stopped Jardiance d/t GI issues. Continue BB therapy.   Pre-op for EGD She had an episode of severe nasuea and vomiting and is scheduled for EGD March 4th. Due to chest pain and DOE plan for Cardiac PET stress as above. EKG is non-ischemic. She stopped all her meds during N/V. She since  restarted all her meds except ASA, Brilinta and Jardiance. OK to stay off Brilinta since she is over 1 year cardiac stenting. I recommend she restart ASA 81mg  daily. EKG shows NSR with no ischemic changes. METS>4. According to RCRI she is 6% risk of MACE. If stress test is low risk, can proceed with EGD.   Regarding ASA therapy, we recommend continuation of ASA throughout the perioperative period.  However, if the surgeon feels that cessation of ASA is required in the perioperative period, it may be stopped 5-7 days prior to surgery with a plan to resume it as soon as felt to be feasible from a surgical standpoint in the post-operative period.    Reproductive/Obstetrics                             Anesthesia Physical Anesthesia Plan  ASA: 3  Anesthesia Plan: General   Post-op Pain Management:    Induction: Intravenous  PONV Risk Score and Plan: 3 and Propofol infusion, TIVA and Treatment may vary due to age or medical condition  Airway Management Planned: Natural Airway  Additional Equipment:   Intra-op Plan:   Post-operative Plan:   Informed Consent: I have reviewed the patients History and Physical, chart, labs and discussed the procedure including the risks, benefits and alternatives for the proposed anesthesia with the patient or authorized representative who has indicated his/her understanding and acceptance.       Plan  Discussed with: CRNA  Anesthesia Plan Comments: (LMA/GETA backup discussed.  Patient consented for risks of anesthesia including but not limited to:  - adverse reactions to medications - damage to eyes, teeth, lips or other oral mucosa - nerve damage due to positioning  - sore throat or hoarseness - damage to heart, brain, nerves, lungs, other parts of body or loss of life  Informed patient about role of CRNA in peri- and intra-operative care.  Patient voiced understanding.)        Anesthesia Quick Evaluation

## 2024-02-07 NOTE — Anesthesia Postprocedure Evaluation (Signed)
 Anesthesia Post Note  Patient: Tammie Smith  Procedure(s) Performed: ESOPHAGOGASTRODUODENOSCOPY (EGD) WITH PROPOFOL  Patient location during evaluation: PACU Anesthesia Type: General Level of consciousness: awake and alert, oriented and patient cooperative Pain management: pain level controlled Vital Signs Assessment: post-procedure vital signs reviewed and stable Respiratory status: spontaneous breathing, nonlabored ventilation and respiratory function stable Cardiovascular status: blood pressure returned to baseline and stable Postop Assessment: adequate PO intake Anesthetic complications: no   No notable events documented.   Last Vitals:  Vitals:   02/07/24 0823 02/07/24 0833  BP: (!) 102/57   Pulse: 78   Resp: 16   Temp: (!) 35.8 C   SpO2: 96% 98%    Last Pain:  Vitals:   02/07/24 0843  TempSrc:   PainSc: 0-No pain                 Reed Breech

## 2024-02-07 NOTE — Transfer of Care (Signed)
 Immediate Anesthesia Transfer of Care Note  Patient: Tammie Smith  Procedure(s) Performed: ESOPHAGOGASTRODUODENOSCOPY (EGD) WITH PROPOFOL  Patient Location: PACU and Endoscopy Unit  Anesthesia Type:General  Level of Consciousness: drowsy and responds to stimulation  Airway & Oxygen Therapy: Patient Spontanous Breathing  Post-op Assessment: Report given to RN and Post -op Vital signs reviewed and stable  Post vital signs: Reviewed and stable  Last Vitals:  Vitals Value Taken Time  BP 102/57 02/07/24 0825  Temp 97   Pulse 78 02/07/24 0825  Resp 14 02/07/24 0825  SpO2 96 % 02/07/24 0825  Vitals shown include unfiled device data.  Last Pain:  Vitals:   02/07/24 0823  TempSrc:   PainSc: Asleep         Complications: No notable events documented.

## 2024-02-07 NOTE — Op Note (Signed)
 [facility] Gastroenterology Patient Name: Tammie Smith Procedure Date: 02/07/2024 7:52 AM MRN: [id number] Account #: [id number] Date of Birth: [birth year] Admit Type: Outpatient Age: 68 Room: [facility] ENDO ROOM 4 Gender: Female Note Status: Finalized Instrument Name: Upper Endoscope [id number] Procedure:             Upper GI endoscopy Indications:           Chest pain (non cardiac) Providers:             Midge Minium MD, MD Referring MD:          Marya Amsler. Dareen Piano MD, MD (Referring MD) Medicines:             Propofol per Anesthesia Complications:         No immediate complications. Procedure:             Pre-Anesthesia Assessment:                        - Prior to the procedure, a History and Physical was                         performed, and patient medications and allergies were                         reviewed. The patient's tolerance of previous                         anesthesia was also reviewed. The risks and benefits                         of the procedure and the sedation options and risks                         were discussed with the patient. All questions were                         answered, and informed consent was obtained. Prior                         Anticoagulants: The patient has taken no anticoagulant                         or antiplatelet agents. ASA Grade Assessment: II - A                         patient with mild systemic disease. After reviewing                         the risks and benefits, the patient was deemed in                         satisfactory condition to undergo the procedure.                        After obtaining informed consent, the endoscope was                         passed under direct vision. Throughout the procedure,  the patient's blood pressure, pulse, and oxygen                         saturations were monitored continuously. The Endoscope                         was introduced through the  mouth, and advanced to the                         second part of duodenum. The upper GI endoscopy was                         accomplished without difficulty. The patient tolerated                         the procedure well. Findings:      A small hiatal hernia was present.      The entire examined stomach was normal. Biopsies were taken with a cold       forceps for histology.      The examined duodenum was normal.      Biopsies were obtained with cold forceps for histology in the middle       third of the esophagus. Impression:            - Small hiatal hernia.                        - Normal stomach. Biopsied.                        - Normal examined duodenum.                        - Biopsy performed in the middle third of the                         esophagus. Recommendation:        - Discharge patient to home.                        - Resume previous diet.                        - Continue present medications.                        - Await pathology results. Procedure Code(s):     --- Professional ---                        [phone], Esophagogastroduodenoscopy, flexible,                         transoral; with biopsy, single or multiple Diagnosis Code(s):     --- Professional ---                        R07.89, Other chest pain CPT copyright 2022 American Medical Association. All rights reserved. The codes documented in this report are preliminary and upon coder review may  be revised to meet current compliance requirements. Midge Minium MD, MD 02/07/2024 8:21:50 AM This report has been signed electronically.  Number of Addenda: 0 Note Initiated On: 02/07/2024 7:52 AM Estimated Blood Loss:  Estimated blood loss: none.      [facility]

## 2024-02-07 NOTE — H&P (Signed)
 Tammie Minium, MD [facility] [address], [address] [place], [place] [address] [phone] Fax : [phone]  Primary Care Physician:  Lauro Regulus, MD Primary Gastroenterologist:  Dr. Servando Snare  Pre-Procedure History & Physical: HPI:  Tammie Smith is a 68 y.o. female is here for an endoscopy.   Past Medical History:  Diagnosis Date   Anxiety, mild 09/24/2021   Very rare xanax, 15 per 6 months     Borderline glaucoma    Cancer (HCC)    skin cancer basal cell   Coronary artery disease 08/16/2022   NSTEMI s/p PCI to OM1   Degenerative disc disease, lumbar    Gestational diabetes    Healthcare maintenance 12/21/2017   Sees gyn, hysterectomy noted and bilateral implants and then removed.  2/17 colonoscopy noted and Tammie Smith now follows and due 2-27, shingrix 1-19 discussed and doesn't want vaccines otherwise     Hyperlipemia    Intractable migraine without status migrainosus 08/06/2022   Right-sided carotid artery disease (HCC)    echo done    Past Surgical History:  Procedure Laterality Date   ABDOMINAL HYSTERECTOMY     APPENDECTOMY     AUGMENTATION MAMMAPLASTY Bilateral 04/05/1984   Pt had implants removed last time   CHOLECYSTECTOMY     COLONOSCOPY WITH PROPOFOL N/A 01/12/2016   Procedure: COLONOSCOPY WITH PROPOFOL;  Surgeon: Christena Deem, MD;  Location: [facility] ENDOSCOPY;  Service: Endoscopy;  Laterality: N/A;   CORONARY STENT INTERVENTION N/A 08/17/2022   Procedure: CORONARY STENT INTERVENTION;  Surgeon: Yvonne Kendall, MD;  Location: [facility] INVASIVE CV LAB;  Service: Cardiovascular;  Laterality: N/A;   CORONARY STENT INTERVENTION N/A 09/13/2022   Procedure: CORONARY STENT INTERVENTION;  Surgeon: Yvonne Kendall, MD;  Location: [facility] INVASIVE CV LAB;  Service: Cardiovascular;  Laterality: N/A;   CORONARY ULTRASOUND/IVUS N/A 09/13/2022   Procedure: Intravascular Ultrasound/IVUS;  Surgeon: Yvonne Kendall, MD;  Location: [facility] INVASIVE CV LAB;  Service: Cardiovascular;   Laterality: N/A;   ESOPHAGOGASTRODUODENOSCOPY (EGD) WITH PROPOFOL N/A 09/18/2019   Procedure: ESOPHAGOGASTRODUODENOSCOPY (EGD) WITH PROPOFOL;  Surgeon: Christena Deem, MD;  Location: [facility] ENDOSCOPY;  Service: Endoscopy;  Laterality: N/A;   LEFT HEART CATH AND CORONARY ANGIOGRAPHY N/A 08/17/2022   Procedure: LEFT HEART CATH AND CORONARY ANGIOGRAPHY;  Surgeon: Yvonne Kendall, MD;  Location: [facility] INVASIVE CV LAB;  Service: Cardiovascular;  Laterality: N/A;   LEFT HEART CATH AND CORONARY ANGIOGRAPHY N/A 09/13/2022   Procedure: LEFT HEART CATH AND CORONARY ANGIOGRAPHY;  Surgeon: Yvonne Kendall, MD;  Location: [facility] INVASIVE CV LAB;  Service: Cardiovascular;  Laterality: N/A;    Prior to Admission medications   Medication Sig Start Date End Date Taking? Authorizing Provider  aspirin EC 81 MG tablet Take 1 tablet (81 mg total) by mouth daily. Swallow whole. 09/28/22  Yes End, Cristal Deer, MD  atorvastatin (LIPITOR) 80 MG tablet Take 1 tablet (80 mg total) by mouth daily. 10/17/23  Yes End, Cristal Deer, MD  pantoprazole (PROTONIX) 40 MG tablet Take 1 tablet (40 mg total) by mouth daily. 01/09/24  Yes Celso Amy, PA-C  propranolol (INDERAL) 80 MG tablet Take 1 tablet (80 mg total) by mouth 2 (two) times daily. 11/07/23  Yes End, Cristal Deer, MD  nitroGLYCERIN (NITROSTAT) 0.4 MG SL tablet Place 1 tablet (0.4 mg total) under the tongue every 5 (five) minutes as needed for chest pain. 09/02/22   End, Cristal Deer, MD  ondansetron (ZOFRAN) 4 MG tablet Take 1 tablet (4 mg total) by mouth every 8 (eight) hours as needed for nausea or  vomiting. 01/09/24 04/08/24  Celso Amy, PA-C  sucralfate (CARAFATE) 1 g tablet Take 1 tablet (1 g total) by mouth 3 (three) times daily before meals. 01/09/24 04/08/24  Celso Amy, PA-C    Allergies as of 01/09/2024 - Review Complete 01/09/2024  Allergen Reaction Noted   Demerol [meperidine] Nausea And Vomiting 12/12/2015   Codeine Nausea And Vomiting 12/05/2018   Crestor  [rosuvastatin]  09/17/2019   Prilosec [omeprazole]  09/17/2019    Family History  Problem Relation Age of Onset   Breast cancer Neg Hx     Social History   Socioeconomic History   Marital status: Widowed    Spouse name: Not on file   Number of children: Not on file   Years of education: Not on file   Highest education level: Not on file  Occupational History   Not on file  Tobacco Use   Smoking status: Never   Smokeless tobacco: Never  Vaping Use   Vaping status: Never Used  Substance and Sexual Activity   Alcohol use: No   Drug use: Never   Sexual activity: Not Currently  Other Topics Concern   Not on file  Social History Narrative   Not on file   Social Drivers of Health   Financial Resource Strain: Low Risk  (10/13/2023)   Received from [facility]   Overall Financial Resource Strain (CARDIA)    Difficulty of Paying Living Expenses: Not hard at all  Food Insecurity: No Food Insecurity (10/13/2023)   Received from [facility]   Hunger Vital Sign    Worried About Running Out of Food in the Last Year: Never true    Ran Out of Food in the Last Year: Never true  Transportation Needs: No Transportation Needs (10/13/2023)   Received from [facility] - Transportation    In the past 12 months, has lack of transportation kept you from medical appointments or from getting medications?: No    Lack of Transportation (Non-Medical): No  Physical Activity: Not on file  Stress: Not on file  Social Connections: Not on file  Intimate Partner Violence: Not At Risk (08/16/2022)   Humiliation, Afraid, Rape, and Kick questionnaire    Fear of Current or Ex-Partner: No    Emotionally Abused: No    Physically Abused: No    Sexually Abused: No    Review of Systems: See HPI, otherwise negative ROS  Physical Exam: BP 134/82   Pulse 72   Temp (!) 96.7 F (35.9 C) (Temporal)   Resp 14   Wt 67.1 kg   SpO2 97%   BMI  26.22 kg/m  General:   Alert,  pleasant and cooperative in NAD Head:  Normocephalic and atraumatic. Neck:  Supple; no masses or thyromegaly. Lungs:  Clear throughout to auscultation.    Heart:  Regular rate and rhythm. Abdomen:  Soft, nontender and nondistended. Normal bowel sounds, without guarding, and without rebound.   Neurologic:  Alert and  oriented x4;  grossly normal neurologically.  Impression/Plan: Tammie Smith is here for an endoscopy to be performed for atypical chest pain  Risks, benefits, limitations, and alternatives regarding  endoscopy have been reviewed with the patient.  Questions have been answered.  All parties agreeable.   Tammie Minium, MD  02/07/2024, 7:54 AM

## 2024-02-08 LAB — SURGICAL PATHOLOGY

## 2024-02-16 ENCOUNTER — Ambulatory Visit: Payer: Managed Care, Other (non HMO) | Admitting: Medical

## 2024-02-19 NOTE — Progress Notes (Unsigned)
 Celso Amy, PA-C [address]  [address]  [place], [place] [address]  Main: [phone]  Fax: [phone]   Primary Care Physician: Lauro Regulus, MD  Primary Gastroenterologist:  Celso Amy, PA-C / Dr. Midge Minium    CC: F/U GERD, erosive esophagitis, gastritis  HPI: Tammie Smith is a 68 y.o. female returns for 6 week follow-up of nausea, vomiting, epigastric pain.  History of GERD with erosive esophagitis, gastritis, and erosive duodenitis.  She has been taking pantoprazole 40 Mg once daily and Carafate 1 g 3 times daily for the past 6 weeks with little benefit.  She is still having mid lower chest pain which radiates through to her mid back between her shoulder blades.  Worse after eating foods, worse at night, also worse on empty stomach.  She takes OTC Pepcid AC which helps a little.  She has follow-up with cardiology.  She is not having any more nausea, vomiting, or lower abdominal pain.  02/07/2024 EGD by Dr. Servando Snare: Small hiatal hernia, normal stomach and duodenum.  Biopsies negative for EOE and H. pylori.   09/2019 EGD by Dr. Marva Panda: LA grade C esophagitis without bleeding.  Small to medium hiatal hernia.  Mild erosive gastritis.  Mild erosive duodenitis.  Biopsies showed erosive gastroesophagitis.  Negative for H. pylori, Barrett's, or dysplasia.   01/2016 colonoscopy by Dr. Marva Panda: Redundant colon, diverticulosis, no polyps, good prep.  10-year repeat.   Medical history significant for CAD with NSTEMI in 08/2022 s/p staged multivessel PCI, HLD, DM2, carotid artery disease, degenerative disc disease, GERD with esophagitis.  Currently takes 81 mg aspirin daily.  No other blood thinners.  Current Outpatient Medications  Medication Sig Dispense Refill   aspirin EC 81 MG tablet Take 1 tablet (81 mg total) by mouth daily. Swallow whole. 90 tablet 3   atorvastatin (LIPITOR) 80 MG tablet Take 1 tablet (80 mg total) by mouth daily. 90 tablet 3   famotidine-calcium  carbonate-magnesium hydroxide (PEPCID COMPLETE) 10-800-165 MG chewable tablet Chew 1 tablet by mouth daily as needed.     nitroGLYCERIN (NITROSTAT) 0.4 MG SL tablet Place 1 tablet (0.4 mg total) under the tongue every 5 (five) minutes as needed for chest pain. 90 tablet 3   ondansetron (ZOFRAN) 4 MG tablet Take 1 tablet (4 mg total) by mouth every 8 (eight) hours as needed for nausea or vomiting. 30 tablet 2   propranolol (INDERAL) 80 MG tablet Take 1 tablet (80 mg total) by mouth 2 (two) times daily. 180 tablet 0   pantoprazole (PROTONIX) 40 MG tablet Take 1 tablet (40 mg total) by mouth 2 (two) times daily before a meal. 180 tablet 3   No current facility-administered medications for this visit.    Allergies as of 02/20/2024 - Review Complete 02/20/2024  Allergen Reaction Noted   Demerol [meperidine] Nausea And Vomiting 12/12/2015   Codeine Nausea And Vomiting 12/05/2018   Crestor [rosuvastatin]  09/17/2019   Prilosec [omeprazole]  09/17/2019    Past Medical History:  Diagnosis Date   Anxiety, mild 09/24/2021   Very rare xanax, 15 per 6 months     Borderline glaucoma    Cancer (HCC)    skin cancer basal cell   Coronary artery disease 08/16/2022   NSTEMI s/p PCI to OM1   Degenerative disc disease, lumbar    Gestational diabetes    Healthcare maintenance 12/21/2017   Sees gyn, hysterectomy noted and bilateral implants and then removed.  2/17 colonoscopy noted and wohl  now follows and due 2-27, shingrix 1-19 discussed and doesn't want vaccines otherwise     Hyperlipemia    Intractable migraine without status migrainosus 08/06/2022   Right-sided carotid artery disease (HCC)    echo done    Past Surgical History:  Procedure Laterality Date   ABDOMINAL HYSTERECTOMY     APPENDECTOMY     AUGMENTATION MAMMAPLASTY Bilateral 04/05/1984   Pt had implants removed last time   CHOLECYSTECTOMY     COLONOSCOPY WITH PROPOFOL N/A 01/12/2016   Procedure: COLONOSCOPY WITH PROPOFOL;  Surgeon:  Christena Deem, MD;  Location: [facility] ENDOSCOPY;  Service: Endoscopy;  Laterality: N/A;   CORONARY STENT INTERVENTION N/A 08/17/2022   Procedure: CORONARY STENT INTERVENTION;  Surgeon: Yvonne Kendall, MD;  Location: [facility] INVASIVE CV LAB;  Service: Cardiovascular;  Laterality: N/A;   CORONARY STENT INTERVENTION N/A 09/13/2022   Procedure: CORONARY STENT INTERVENTION;  Surgeon: Yvonne Kendall, MD;  Location: [facility] INVASIVE CV LAB;  Service: Cardiovascular;  Laterality: N/A;   CORONARY ULTRASOUND/IVUS N/A 09/13/2022   Procedure: Intravascular Ultrasound/IVUS;  Surgeon: Yvonne Kendall, MD;  Location: [facility] INVASIVE CV LAB;  Service: Cardiovascular;  Laterality: N/A;   ESOPHAGOGASTRODUODENOSCOPY (EGD) WITH PROPOFOL N/A 09/18/2019   Procedure: ESOPHAGOGASTRODUODENOSCOPY (EGD) WITH PROPOFOL;  Surgeon: Christena Deem, MD;  Location: [facility] ENDOSCOPY;  Service: Endoscopy;  Laterality: N/A;   ESOPHAGOGASTRODUODENOSCOPY (EGD) WITH PROPOFOL N/A 02/07/2024   Procedure: ESOPHAGOGASTRODUODENOSCOPY (EGD) WITH PROPOFOL;  Surgeon: Midge Minium, MD;  Location: [facility] ENDOSCOPY;  Service: Endoscopy;  Laterality: N/A;   LEFT HEART CATH AND CORONARY ANGIOGRAPHY N/A 08/17/2022   Procedure: LEFT HEART CATH AND CORONARY ANGIOGRAPHY;  Surgeon: Yvonne Kendall, MD;  Location: [facility] INVASIVE CV LAB;  Service: Cardiovascular;  Laterality: N/A;   LEFT HEART CATH AND CORONARY ANGIOGRAPHY N/A 09/13/2022   Procedure: LEFT HEART CATH AND CORONARY ANGIOGRAPHY;  Surgeon: Yvonne Kendall, MD;  Location: [facility] INVASIVE CV LAB;  Service: Cardiovascular;  Laterality: N/A;    Review of Systems:    All systems reviewed and negative except where noted in HPI.   Physical Examination:   BP 131/74   Pulse 88   Temp 97.9 F (36.6 C)   Ht 5\' 3"  (1.6 m)   Wt 151 lb 9.6 oz (68.8 kg)   BMI 26.85 kg/m   General: Well-nourished, well-developed in no acute distress.  Lungs: Clear to auscultation bilaterally. Non-labored.  No chest wall  tenderness. Heart: Regular rate and rhythm, no murmurs rubs or gallops.  Abdomen: Bowel sounds are normal; Abdomen is Soft; No hepatosplenomegaly, masses or hernias;  No Abdominal Tenderness; No guarding or rebound tenderness. Neuro: Alert and oriented x 3.  Grossly intact.  Psych: Alert and cooperative, normal mood and affect.   Imaging Studies: No results found.  Assessment and Plan:   Tammie Smith is a 68 y.o. y/o female returns for follow-up of GERD with esophagitis.  Recent EGD showed small hiatal hernia, normal stomach and duodenum.  Biopsies showed chronic gastritis, and negative for EOE and H. pylori.  1.  GERD with esophagitis  Increase Protonix 40 Mg to 1 tablet twice daily  Take OTC Pepcid AC (Famotidine) 20mg  BID.  Stop Carafate.  Continue Zofran as needed  Recommend Lifestyle Modifications to prevent Acid Reflux.  Rec. Avoid coffee, sodas, peppermint, garlic, onions, alcohol, citrus fruits, chocolate, tomatoes, fatty and spicey foods.  Avoid eating 2-3 hours before bedtime.    Avoid NSAIDs.  2.  Colon cancer screening  10-year repeat colonoscopy will be due 01/2026.  3.  Chest Pain and Hx CAD.  Ordering Chest Xray - 2 View.  She will also continue to f/u with Cardiology.   Celso Amy, PA-C  Follow up in 3 months for GERD.

## 2024-02-20 ENCOUNTER — Ambulatory Visit
Admission: RE | Admit: 2024-02-20 | Discharge: 2024-02-20 | Disposition: A | Discharge status: Home / Self Care | Attending: Physician Assistant | Admitting: Physician Assistant

## 2024-02-20 ENCOUNTER — Ambulatory Visit: Payer: Managed Care, Other (non HMO) | Admitting: Physician Assistant

## 2024-02-20 ENCOUNTER — Ambulatory Visit
Admission: RE | Admit: 2024-02-20 | Discharge: 2024-02-20 | Disposition: A | Discharge status: Home / Self Care | Source: Ambulatory Visit | Attending: Physician Assistant | Admitting: Physician Assistant

## 2024-02-20 ENCOUNTER — Encounter: Payer: Self-pay | Admitting: Physician Assistant

## 2024-02-20 VITALS — BP 131/74 | HR 88 | Temp 97.9°F | Ht 63.0 in | Wt 151.6 lb

## 2024-02-20 DIAGNOSIS — R079 Chest pain, unspecified: Secondary | ICD-10-CM | POA: Insufficient documentation

## 2024-02-20 DIAGNOSIS — K449 Diaphragmatic hernia without obstruction or gangrene: Secondary | ICD-10-CM | POA: Diagnosis not present

## 2024-02-20 DIAGNOSIS — K2950 Unspecified chronic gastritis without bleeding: Secondary | ICD-10-CM | POA: Diagnosis not present

## 2024-02-20 DIAGNOSIS — K2100 Gastro-esophageal reflux disease with esophagitis, without bleeding: Secondary | ICD-10-CM | POA: Diagnosis not present

## 2024-02-20 MED ORDER — PANTOPRAZOLE SODIUM 40 MG PO TBEC: 40.0000 mg | DELAYED_RELEASE_TABLET | Freq: Two times a day (BID) | ORAL | 3 refills | Status: DC

## 2024-02-21 ENCOUNTER — Encounter: Payer: Self-pay | Admitting: Gastroenterology

## 2024-02-28 NOTE — Telephone Encounter (Signed)
Pt requesting call back to discuss results.  

## 2024-03-06 ENCOUNTER — Other Ambulatory Visit

## 2024-03-06 ENCOUNTER — Telehealth: Payer: Self-pay | Admitting: Medical

## 2024-03-06 ENCOUNTER — Telehealth: Payer: Self-pay

## 2024-03-06 DIAGNOSIS — I25118 Atherosclerotic heart disease of native coronary artery with other forms of angina pectoris: Secondary | ICD-10-CM

## 2024-03-06 NOTE — Telephone Encounter (Signed)
-----   Message from Nurse Kathrene Alu sent at 03/06/2024 11:20 AM EDT ----- Please call pt to schedule her for a lexiscan   Thanks

## 2024-03-06 NOTE — Telephone Encounter (Signed)
LVM to schedule appt, please schedule 

## 2024-03-06 NOTE — Telephone Encounter (Signed)
 Pt made aware that her insurance company denied Cardiac PET Scan and order has been changed to lexiscan. Pt verbalized understanding.    Fransico Michael, Cadence H, PA-C  Parke Poisson, RN We can order [employer].

## 2024-03-07 ENCOUNTER — Telehealth: Payer: Self-pay

## 2024-03-07 NOTE — Telephone Encounter (Signed)
 Normal imaging results per Dr.Anna-patient notified.

## 2024-03-08 ENCOUNTER — Ambulatory Visit: Payer: Managed Care, Other (non HMO)

## 2024-03-13 ENCOUNTER — Ambulatory Visit
Admission: RE | Admit: 2024-03-13 | Discharge: 2024-03-13 | Disposition: A | Discharge status: Home / Self Care | Source: Ambulatory Visit | Attending: Medical | Admitting: Medical

## 2024-03-13 DIAGNOSIS — I25118 Atherosclerotic heart disease of native coronary artery with other forms of angina pectoris: Secondary | ICD-10-CM | POA: Insufficient documentation

## 2024-03-13 MED ORDER — REGADENOSON 0.4 MG/5ML IV SOLN
0.4000 mg | Freq: Once | INTRAVENOUS | Status: AC
  Administered 2024-03-13: 0.4 mg via INTRAVENOUS

## 2024-03-13 MED ORDER — TECHNETIUM TC 99M TETROFOSMIN IV KIT
10.0000 | PACK | Freq: Once | INTRAVENOUS | Status: AC | PRN
  Administered 2024-03-13: 11.08 via INTRAVENOUS

## 2024-03-13 MED ORDER — TECHNETIUM TC 99M TETROFOSMIN IV KIT
32.9100 | PACK | Freq: Once | INTRAVENOUS | Status: AC | PRN
  Administered 2024-03-13: 32.91 via INTRAVENOUS

## 2024-03-19 LAB — NM MYOCAR MULTI W/SPECT W/WALL MOTION / EF
LV dias vol: 48 mL (ref 46–106)
LV sys vol: 13 mL
MPHR: 153 {beats}/min
Nuc Stress EF: 73 %
Peak HR: 111 {beats}/min
Percent HR: 72 %
Rest HR: 64 {beats}/min
Rest Nuclear Isotope Dose: 11.1 mCi
SDS: 0
SRS: 3
SSS: 0
ST Depression (mm): 0 mm
Stress Nuclear Isotope Dose: 32.9 mCi
TID: 0.83

## 2024-04-11 ENCOUNTER — Other Ambulatory Visit: Payer: Self-pay

## 2024-04-11 DIAGNOSIS — K2100 Gastro-esophageal reflux disease with esophagitis, without bleeding: Secondary | ICD-10-CM

## 2024-04-11 MED ORDER — PANTOPRAZOLE SODIUM 40 MG PO TBEC
40.0000 mg | DELAYED_RELEASE_TABLET | Freq: Two times a day (BID) | ORAL | 3 refills | Status: AC
Start: 2024-04-11 — End: 2025-04-06

## 2024-05-02 ENCOUNTER — Ambulatory Visit: Payer: Managed Care, Other (non HMO) | Admitting: Medical

## 2024-05-23 ENCOUNTER — Ambulatory Visit: Admitting: Medical

## 2024-06-18 ENCOUNTER — Other Ambulatory Visit: Payer: Self-pay

## 2024-06-18 MED ORDER — PROPRANOLOL HCL 80 MG PO TABS: 80.0000 mg | ORAL_TABLET | Freq: Two times a day (BID) | ORAL | 2 refills | Status: DC

## 2024-08-24 ENCOUNTER — Ambulatory Visit: Admission: RE | Admit: 2024-08-24 | Source: Home / Self Care | Admitting: Gastroenterology

## 2024-08-24 SURGERY — EGD (ESOPHAGOGASTRODUODENOSCOPY)
Anesthesia: General

## 2024-09-07 ENCOUNTER — Encounter: Admission: RE | Disposition: A | Payer: Self-pay | Source: Home / Self Care | Attending: Gastroenterology

## 2024-09-07 ENCOUNTER — Ambulatory Visit: Admitting: Anesthesiology

## 2024-09-07 ENCOUNTER — Other Ambulatory Visit: Payer: Self-pay

## 2024-09-07 ENCOUNTER — Ambulatory Visit
Admission: RE | Admit: 2024-09-07 | Discharge: 2024-09-07 | Disposition: A | Discharge status: Home / Self Care | Attending: Gastroenterology | Admitting: Gastroenterology

## 2024-09-07 ENCOUNTER — Encounter: Payer: Self-pay | Admitting: Gastroenterology

## 2024-09-07 DIAGNOSIS — I2510 Atherosclerotic heart disease of native coronary artery without angina pectoris: Secondary | ICD-10-CM | POA: Diagnosis not present

## 2024-09-07 DIAGNOSIS — K219 Gastro-esophageal reflux disease without esophagitis: Secondary | ICD-10-CM | POA: Diagnosis not present

## 2024-09-07 DIAGNOSIS — I10 Essential (primary) hypertension: Secondary | ICD-10-CM | POA: Insufficient documentation

## 2024-09-07 DIAGNOSIS — E119 Type 2 diabetes mellitus without complications: Secondary | ICD-10-CM | POA: Insufficient documentation

## 2024-09-07 DIAGNOSIS — I252 Old myocardial infarction: Secondary | ICD-10-CM | POA: Diagnosis not present

## 2024-09-07 DIAGNOSIS — K3189 Other diseases of stomach and duodenum: Secondary | ICD-10-CM | POA: Insufficient documentation

## 2024-09-07 DIAGNOSIS — K922 Gastrointestinal hemorrhage, unspecified: Secondary | ICD-10-CM | POA: Insufficient documentation

## 2024-09-07 DIAGNOSIS — K449 Diaphragmatic hernia without obstruction or gangrene: Secondary | ICD-10-CM | POA: Diagnosis not present

## 2024-09-07 DIAGNOSIS — Z7984 Long term (current) use of oral hypoglycemic drugs: Secondary | ICD-10-CM | POA: Diagnosis not present

## 2024-09-07 DIAGNOSIS — K31A0 Gastric intestinal metaplasia, unspecified: Secondary | ICD-10-CM | POA: Diagnosis present

## 2024-09-07 HISTORY — PX: ESOPHAGOGASTRODUODENOSCOPY: SHX5428

## 2024-09-07 HISTORY — DX: Acute myocardial infarction, unspecified: I21.9

## 2024-09-07 LAB — GLUCOSE, CAPILLARY: Glucose-Capillary: 133 mg/dL — ABNORMAL HIGH (ref 70–99)

## 2024-09-07 SURGERY — EGD (ESOPHAGOGASTRODUODENOSCOPY)
Anesthesia: General

## 2024-09-07 MED ORDER — FENTANYL CITRATE (PF) 100 MCG/2ML IJ SOLN
INTRAMUSCULAR | Status: AC
  Filled 2024-09-07: qty 2

## 2024-09-07 MED ORDER — PROPOFOL 1000 MG/100ML IV EMUL
INTRAVENOUS | Status: AC
  Filled 2024-09-07: qty 100

## 2024-09-07 MED ORDER — LIDOCAINE HCL (PF) 2 % IJ SOLN
INTRAMUSCULAR | Status: AC
  Filled 2024-09-07: qty 5

## 2024-09-07 MED ORDER — FENTANYL CITRATE (PF) 100 MCG/2ML IJ SOLN
INTRAMUSCULAR | Status: DC | PRN
  Administered 2024-09-07: 50 ug via INTRAVENOUS

## 2024-09-07 MED ORDER — SODIUM CHLORIDE 0.9 % IV SOLN: INTRAVENOUS | Status: DC

## 2024-09-07 MED ORDER — PROPOFOL 10 MG/ML IV BOLUS
INTRAVENOUS | Status: DC | PRN
  Administered 2024-09-07: 70 mg via INTRAVENOUS
  Administered 2024-09-07: 50 mg via INTRAVENOUS

## 2024-09-07 MED ORDER — LIDOCAINE HCL (CARDIAC) PF 100 MG/5ML IV SOSY
PREFILLED_SYRINGE | INTRAVENOUS | Status: DC | PRN
  Administered 2024-09-07: 70 mg via INTRAVENOUS

## 2024-09-07 NOTE — Transfer of Care (Signed)
 Immediate Anesthesia Transfer of Care Note  Patient: Tammie Smith  Procedure(s) Performed: EGD (ESOPHAGOGASTRODUODENOSCOPY)  Patient Location: PACU  Anesthesia Type:MAC  Level of Consciousness: awake, alert , and oriented  Airway & Oxygen Therapy: Patient Spontanous Breathing  Post-op Assessment: Report given to RN and Post -op Vital signs reviewed and stable  Post vital signs: stable  Last Vitals:  Vitals Value Taken Time  BP    Temp    Pulse 124 09/07/24 10:11  Resp 18 09/07/24 10:11  SpO2 97 % 09/07/24 10:11  Vitals shown include unfiled device data.  Last Pain:  Vitals:   09/07/24 0842  TempSrc: Temporal  PainSc: 0-No pain         Complications: No notable events documented.

## 2024-09-07 NOTE — Anesthesia Preprocedure Evaluation (Signed)
 Anesthesia Evaluation  Patient identified by MRN, date of birth, ID band Patient awake    Reviewed: Allergy & Precautions, NPO status , Patient's Chart, lab work & pertinent test results  Airway Mallampati: II  TM Distance: >3 FB Neck ROM: Full    Dental  (+) Teeth Intact   Pulmonary neg pulmonary ROS   Pulmonary exam normal        Cardiovascular Exercise Tolerance: Good hypertension, + CAD, + Past MI and + Cardiac Stents  negative cardio ROS Normal cardiovascular exam Rhythm:Regular     Neuro/Psych  Headaches  Anxiety     negative neurological ROS  negative psych ROS   GI/Hepatic negative GI ROS, Neg liver ROS,GERD  ,,  Endo/Other  negative endocrine ROSdiabetes, Type 2, Oral Hypoglycemic Agents  Class 3 obesity  Renal/GU negative Renal ROS  negative genitourinary   Musculoskeletal   Abdominal  (+) + obese  Peds negative pediatric ROS (+)  Hematology negative hematology ROS (+)   Anesthesia Other Findings Past Medical History: 09/24/2021: Anxiety, mild     Comment:  Very rare xanax, 15 per 6 months   No date: Borderline glaucoma No date: Cancer [facility])     Comment:  skin cancer basal cell 08/16/2022: Coronary artery disease     Comment:  NSTEMI s/p PCI to OM1 No date: Degenerative disc disease, lumbar No date: Gestational diabetes 12/21/2017: Healthcare maintenance     Comment:  Sees gyn, hysterectomy noted and bilateral implants and               then removed.  2/17 colonoscopy noted and wohl now               follows and due 2-27, shingrix 1-19 discussed and doesn't              want vaccines otherwise   No date: Hyperlipemia 08/06/2022: Intractable migraine without status migrainosus No date: Myocardial infarction (HCC) No date: Right-sided carotid artery disease     Comment:  echo done  Past Surgical History: No date: ABDOMINAL HYSTERECTOMY No date: APPENDECTOMY 04/05/1984: AUGMENTATION  MAMMAPLASTY; Bilateral     Comment:  Pt had implants removed last time No date: CHOLECYSTECTOMY 01/12/2016: COLONOSCOPY WITH PROPOFOL ; N/A     Comment:  Procedure: COLONOSCOPY WITH PROPOFOL ;  Surgeon: Gladis RAYMOND Mariner, MD;  Location: [facility] ENDOSCOPY;  Service:               Endoscopy;  Laterality: N/A; 08/17/2022: CORONARY STENT INTERVENTION; N/A     Comment:  Procedure: CORONARY STENT INTERVENTION;  Surgeon: Mady Bruckner, MD;  Location: [facility] INVASIVE CV LAB;                Service: Cardiovascular;  Laterality: N/A; 09/13/2022: CORONARY STENT INTERVENTION; N/A     Comment:  Procedure: CORONARY STENT INTERVENTION;  Surgeon: Mady Bruckner, MD;  [facility]: [facility] INVASIVE CV LAB;  Service:              Cardiovascular;  Laterality: N/A; 09/13/2022: CORONARY ULTRASOUND/IVUS; N/A     Comment:  Procedure: Intravascular Ultrasound/IVUS;  Surgeon: Mady Bruckner, MD;  Location: [facility] INVASIVE CV LAB;  Service:  Cardiovascular;  Laterality: N/A; 09/18/2019: ESOPHAGOGASTRODUODENOSCOPY (EGD) WITH PROPOFOL ; N/A     Comment:  Procedure: ESOPHAGOGASTRODUODENOSCOPY (EGD) WITH               PROPOFOL ;  Surgeon: Gaylyn Gladis PENNER, MD;  Location:               [facility] ENDOSCOPY;  Service: Endoscopy;  Laterality: N/A; 02/07/2024: ESOPHAGOGASTRODUODENOSCOPY (EGD) WITH PROPOFOL ; N/A     Comment:  Procedure: ESOPHAGOGASTRODUODENOSCOPY (EGD) WITH               PROPOFOL ;  Surgeon: Jinny Carmine, MD;  Location: [facility]               ENDOSCOPY;  Service: Endoscopy;  Laterality: N/A; 08/17/2022: LEFT HEART CATH AND CORONARY ANGIOGRAPHY; N/A     Comment:  Procedure: LEFT HEART CATH AND CORONARY ANGIOGRAPHY;                Surgeon: Mady Bruckner, MD;  Location: [facility] INVASIVE               CV LAB;  Service: Cardiovascular;  Laterality: N/A; 09/13/2022: LEFT HEART CATH AND CORONARY ANGIOGRAPHY; N/A     Comment:  Procedure: LEFT HEART CATH AND CORONARY  ANGIOGRAPHY;                Surgeon: Mady Bruckner, MD;  [facility] INVASIVE CV               LAB;  Service: Cardiovascular;  Laterality: N/A;  BMI    Body Mass Index: 26.15 kg/m      Reproductive/Obstetrics negative OB ROS                              Anesthesia Physical Anesthesia Plan  ASA: 3  Anesthesia Plan: General   Post-op Pain Management:    Induction: Intravenous  PONV Risk Score and Plan: Propofol  infusion and TIVA  Airway Management Planned: Natural Airway and Nasal Cannula  Additional Equipment:   Intra-op Plan:   Post-operative Plan:   Informed Consent: I have reviewed the patients History and Physical, chart, labs and discussed the procedure including the risks, benefits and alternatives for the proposed anesthesia with the patient or authorized representative who has indicated his/her understanding and acceptance.     Dental Advisory Given  Plan Discussed with: CRNA  Anesthesia Plan Comments:         Anesthesia Quick Evaluation

## 2024-09-07 NOTE — H&P (Signed)
 Tammie JONELLE Brooklyn, MD [facility], DHIP [address]  [place], [place] [address]  Main: [phone] Fax:  [phone] Pager: [phone]   Primary Care Physician:  Lenon Layman ORN, MD Primary Gastroenterologist:  Dr. Corinn JONELLE Smith  Pre-Procedure History & Physical: HPI:  Tammie Smith is a 68 y.o. female is here for an endoscopy.   Past Medical History:  Diagnosis Date   Anxiety, mild 09/24/2021   Very rare xanax, 15 per 6 months     Borderline glaucoma    Cancer (HCC)    skin cancer basal cell   Coronary artery disease 08/16/2022   NSTEMI s/p PCI to OM1   Degenerative disc disease, lumbar    Gestational diabetes    Healthcare maintenance 12/21/2017   Sees gyn, hysterectomy noted and bilateral implants and then removed.  2/17 colonoscopy noted and wohl now follows and due 2-27, shingrix 1-19 discussed and doesn't want vaccines otherwise     Hyperlipemia    Intractable migraine without status migrainosus 08/06/2022   Myocardial infarction [facility])    Right-sided carotid artery disease    echo done    Past Surgical History:  Procedure Laterality Date   ABDOMINAL HYSTERECTOMY     APPENDECTOMY     AUGMENTATION MAMMAPLASTY Bilateral 04/05/1984   Pt had implants removed last time   CHOLECYSTECTOMY     COLONOSCOPY WITH PROPOFOL  N/A 01/12/2016   Procedure: COLONOSCOPY WITH PROPOFOL ;  Surgeon: Gladis RAYMOND Mariner, MD;  Location: [facility] ENDOSCOPY;  Service: Endoscopy;  Laterality: N/A;   CORONARY STENT INTERVENTION N/A 08/17/2022   Procedure: CORONARY STENT INTERVENTION;  Surgeon: Mady Bruckner, MD;  Location: [facility] INVASIVE CV LAB;  Service: Cardiovascular;  Laterality: N/A;   CORONARY STENT INTERVENTION N/A 09/13/2022   Procedure: CORONARY STENT INTERVENTION;  Surgeon: Mady Bruckner, MD;  Location: [facility] INVASIVE CV LAB;  Service: Cardiovascular;  Laterality: N/A;   CORONARY ULTRASOUND/IVUS N/A 09/13/2022   Procedure: Intravascular  Ultrasound/IVUS;  Surgeon: Mady Bruckner, MD;  Location: [facility] INVASIVE CV LAB;  Service: Cardiovascular;  Laterality: N/A;   ESOPHAGOGASTRODUODENOSCOPY (EGD) WITH PROPOFOL  N/A 09/18/2019   Procedure: ESOPHAGOGASTRODUODENOSCOPY (EGD) WITH PROPOFOL ;  Surgeon: Mariner Gladis RAYMOND, MD;  Location: [facility] END[facility]COPY;  Service: Endoscopy;  Laterality: N/A;   ESOPHAGOGASTRODUODENOSCOPY (EGD) WITH PROPOFOL  N/A 02/07/2024   Procedure: ESOPHAGOGASTRODUODENOSCOPY (EGD) WITH PROPOFOL ;  Surgeon: Jinny Carmine, MD;  Location: [facility] ENDOSCOPY;  Service: Endoscopy;  Laterality: N/A;   LEFT HEART CATH AND CORONARY ANGIOGRAPHY N/A 08/17/2022   Procedure: LEFT HEART CATH AND CORONARY ANGIOGRAPHY;  Surgeon: Mady Bruckner, MD;  Location: [facility] INVASIVE CV LAB;  Service: Cardiovascular;  Laterality: N/A;   LEFT HEART CATH AND CORONARY ANGIOGRAPHY N/A 09/13/2022   Procedure: LEFT HEART CATH AND CORONARY ANGIOGRAPHY;  Surgeon: Mady Bruckner, MD;  Location: [facility] INVASIVE CV LAB;  Service: Cardiovascular;  Laterality: N/A;    Prior to Admission medications   Medication Sig Start Date End Date Taking? Authorizing Provider  amoxicillin-clavulanate (AUGMENTIN) 875-125 MG tablet Take 1 tablet by mouth 2 (two) times daily.   Yes [provider]  Aspirin -Acetaminophen -Caffeine (EXCEDRIN EXTRA STRENGTH PO) Take 250 mg by mouth as needed (MIGRAINE). 250/65   Yes [provider]  atorvastatin  (LIPITOR) 80 MG tablet Take 1 tablet (80 mg total) by mouth daily. 10/17/23  Yes End, Bruckner, MD  empagliflozin (JARDIANCE) 25 MG TABS tablet Take 25 mg by mouth daily.   Yes [provider]  pantoprazole  (PROTONIX ) 40 MG tablet Take 1 tablet (40 mg total) by mouth  2 (two) times daily before a meal. 04/11/24 04/06/25 Yes Therisa Bi, MD  predniSONE (DELTASONE) 20 MG tablet Take 20 mg by mouth daily with breakfast. TAPER AS DIRECTED   Yes [provider]  propranolol  (INDERAL ) 80 MG tablet Take 1 tablet (80 mg  total) by mouth 2 (two) times daily. 06/18/24  Yes End, Lonni, MD  aspirin  EC 81 MG tablet Take 1 tablet (81 mg total) by mouth daily. Swallow whole. 09/28/22   End, Lonni, MD  famotidine-calcium  carbonate-magnesium hydroxide (PEPCID COMPLETE) 10-800-165 MG chewable tablet Chew 1 tablet by mouth daily as needed.    [provider]  nitroGLYCERIN  (NITROSTAT ) 0.4 MG SL tablet Place 1 tablet (0.4 mg total) under the tongue every 5 (five) minutes as needed for chest pain. 09/02/22   End, Lonni, MD    Allergies as of 08/25/2024 - Review Complete 08/17/2024  Allergen Reaction Noted   Demerol [meperidine] Nausea And Vomiting 12/12/2015   Codeine Nausea And Vomiting 12/05/2018   Crestor [rosuvastatin]  09/17/2019   Prilosec [omeprazole]  09/17/2019    Family History  Problem Relation Age of Onset   Breast cancer Neg Hx     Social History   Socioeconomic History   Marital status: Widowed    Spouse name: Not on file   Number of children: Not on file   Years of education: Not on file   Highest education level: Not on file  Occupational History   Not on file  Tobacco Use   Smoking status: Never   Smokeless tobacco: Never  Vaping Use   Vaping status: Never Used  Substance and Sexual Activity   Alcohol use: No   Drug use: Never   Sexual activity: Not Currently  Other Topics Concern   Not on file  Social History Narrative   Not on file   Social Drivers of Health   Financial Resource Strain: Low Risk  (10/13/2023)   Received from [facility]   Overall Financial Resource Strain (CARDIA)    Difficulty of Paying Living Expenses: Not hard at all  Food Insecurity: No Food Insecurity (10/13/2023)   Received from [facility]   Hunger Vital Sign    Within the past 12 months, you worried that your food would run out before you got the money to buy more.: Never true    Within the past 12 months, the food you bought just didn't last  and you didn't have money to get more.: Never true  Transportation Needs: No Transportation Needs (10/13/2023)   Received from [facility] - Transportation    In the past 12 months, has lack of transportation kept you from medical appointments or from getting medications?: No    Lack of Transportation (Non-Medical): No  Physical Activity: Not on file  Stress: Not on file  Social Connections: Not on file  Intimate Partner Violence: Not At Risk (08/16/2022)   Humiliation, Afraid, Rape, and Kick questionnaire    Fear of Current or Ex-Partner: No    Emotionally Abused: No    Physically Abused: No    Sexually Abused: No    Review of Systems: See HPI, otherwise negative ROS  Physical Exam: BP 137/78   Pulse 70   Temp (!) 96.5 F (35.8 C) (Temporal)   Resp 16   Ht 5' 3 (1.6 m)   Wt 67 kg   SpO2 99%   BMI 26.15 kg/m  General:   Alert,  pleasant and  cooperative in NAD Head:  Normocephalic and atraumatic. Neck:  Supple; no masses or thyromegaly. Lungs:  Clear throughout to auscultation.    Heart:  Regular rate and rhythm. Abdomen:  Soft, nontender and nondistended. Normal bowel sounds, without guarding, and without rebound.   Neurologic:  Alert and  oriented x4;  grossly normal neurologically.  Impression/Plan: Tammie Smith is here for an endoscopy to be performed for gastric intestinal metaplasia, Epigastric pain with abdominal bloating   Risks, benefits, limitations, and alternatives regarding  endoscopy have been reviewed with the patient.  Questions have been answered.  All parties agreeable.   Tammie Brooklyn, MD  09/07/2024, 9:38 AM

## 2024-09-07 NOTE — Op Note (Signed)
 [facility] Patient Name: Tammie Smith Procedure Date: 09/07/2024 9:42 AM MRN: [id number] Account #: [id number] Date of Birth: 1956/08/22 Admit Type: Outpatient Age: 68 Room: [facility] ENDO ROOM 1 Gender: Female Note Status: Finalized Instrument Name: Barnie GI Scope [id number] Procedure:             Upper GI endoscopy Indications:           Epigastric abdominal pain, Intestinal metaplasia,                         Follow-up of intestinal metaplasia Providers:             Corinn Jess Brooklyn MD, MD Referring MD:          Layman ORN. Lenon MD, MD (Referring MD) Medicines:             General Anesthesia Complications:         No immediate complications. Estimated blood loss: None. Procedure:             Pre-Anesthesia Assessment:                        - Prior to the procedure, a History and Physical was                         performed, and patient medications and allergies were                         reviewed. The patient is competent. The risks and                         benefits of the procedure and the sedation options and                         risks were discussed with the patient. All questions                         were answered and informed consent was obtained.                         Patient identification and proposed procedure were                         verified by the physician, the nurse, the                         anesthesiologist, the anesthetist and the technician                         in the pre-procedure area in the procedure room in the                         endoscopy suite. Mental Status Examination: alert and                         oriented. Airway Examination: normal oropharyngeal                         airway and neck mobility. Respiratory Examination:  clear to auscultation. CV Examination: normal.                         Prophylactic Antibiotics: The patient does not require                          prophylactic antibiotics. Prior Anticoagulants: The                         patient has taken no anticoagulant or antiplatelet                         agents. ASA Grade Assessment: III - A patient with                         severe systemic disease. After reviewing the risks and                         benefits, the patient was deemed in satisfactory                         condition to undergo the procedure. The anesthesia                         plan was to use general anesthesia. Immediately prior                         to administration of medications, the patient was                         re-assessed for adequacy to receive sedatives. The                         heart rate, respiratory rate, oxygen saturations,                         blood pressure, adequacy of pulmonary ventilation, and                         response to care were monitored throughout the                         procedure. The physical status of the patient was                         re-assessed after the procedure.                        After obtaining informed consent, the endoscope was                         passed under direct vision. Throughout the procedure,                         the patient's blood pressure, pulse, and oxygen                         saturations were monitored continuously. The Endoscope  was introduced through the mouth, and advanced to the                         second part of duodenum. The upper GI endoscopy was                         accomplished without difficulty. The patient tolerated                         the procedure well. Findings:      Diffuse mildly scalloped mucosa was found in the duodenal bulb and in       the second portion of the duodenum. Biopsies for histology were taken       with a cold forceps for evaluation of celiac disease.      Multiple dispersed small erosions with stigmata of recent bleeding were       found in the  gastric antrum. Biopsies were taken with a cold forceps for       histology.      The gastric body and incisura were normal. Biopsies were taken with a       cold forceps for histology.      A small hiatal hernia was present.      Esophagogastric landmarks were identified: the gastroesophageal junction       was found at 35 cm from the incisors.      The gastroesophageal junction and examined esophagus were normal. Impression:            - Scalloped mucosa was found in the duodenum, rule out                         celiac disease. Biopsied.                        - Erosive gastropathy with stigmata of recent                         bleeding. Biopsied.                        - Normal gastric body and incisura. Biopsied.                        - Small hiatal hernia.                        - Esophagogastric landmarks identified.                        - Normal gastroesophageal junction and esophagus. Recommendation:        - Await pathology results.                        - Discharge patient to home (with escort).                        - Resume previous diet today.                        - Continue present medications.                        -  Use a proton pump inhibitor PO daily indefinitely. Procedure Code(s):     --- Professional ---                        [phone], Esophagogastroduodenoscopy, flexible,                         transoral; with biopsy, single or multiple Diagnosis Code(s):     --- Professional ---                        K31.89, Other diseases of stomach and duodenum                        K92.2, Gastrointestinal hemorrhage, unspecified                        K44.9, Diaphragmatic hernia without obstruction or                         gangrene                        R10.13, Epigastric pain                        K31.A0, Gastric intestinal metaplasia, unspecified CPT copyright 2022 American Medical Association. All rights reserved. The codes documented in this report are  preliminary and upon coder review may  be revised to meet current compliance requirements. Dr. Corinn Brooklyn Corinn Jess Brooklyn MD, MD 09/07/2024 10:09:20 AM This report has been signed electronically. Number of Addenda: 0 Note Initiated On: 09/07/2024 9:42 AM Estimated Blood Loss:  Estimated blood loss: none.      [facility]

## 2024-09-07 NOTE — Anesthesia Postprocedure Evaluation (Signed)
 Anesthesia Post Note  Patient: Tammie Smith  Procedure(s) Performed: EGD (ESOPHAGOGASTRODUODENOSCOPY)  Patient location during evaluation: PACU Anesthesia Type: General Level of consciousness: awake Pain management: satisfactory to patient Vital Signs Assessment: post-procedure vital signs reviewed and stable Respiratory status: nonlabored ventilation Cardiovascular status: blood pressure returned to baseline Anesthetic complications: no   No notable events documented.   Last Vitals:  Vitals:   09/07/24 1011 09/07/24 1021  BP: 123/82 98/71  Pulse: 74   Resp:    Temp: 36.9 C   SpO2:      Last Pain:  Vitals:   09/07/24 1021  TempSrc:   PainSc: 0-No pain                 VAN STAVEREN,Greer Koeppen

## 2024-09-10 LAB — SURGICAL PATHOLOGY

## 2024-09-11 ENCOUNTER — Ambulatory Visit: Payer: Self-pay | Admitting: Gastroenterology

## 2024-10-04 ENCOUNTER — Other Ambulatory Visit: Payer: Self-pay | Admitting: Obstetrics and Gynecology

## 2024-10-04 DIAGNOSIS — Z1231 Encounter for screening mammogram for malignant neoplasm of breast: Secondary | ICD-10-CM

## 2024-10-11 ENCOUNTER — Other Ambulatory Visit: Payer: Self-pay | Admitting: Internal Medicine

## 2024-10-12 NOTE — Telephone Encounter (Signed)
Pt scheduled on 11/13

## 2024-10-12 NOTE — Telephone Encounter (Signed)
Please contact pt for future appointment. ?Pt overdue for 3 month f/u. ?

## 2024-10-18 ENCOUNTER — Encounter: Payer: Self-pay | Admitting: Medical

## 2024-10-18 ENCOUNTER — Ambulatory Visit: Attending: Medical | Admitting: Medical

## 2024-10-18 VITALS — BP 140/80 | HR 77 | Ht 63.0 in | Wt 149.5 lb

## 2024-10-18 DIAGNOSIS — I255 Ischemic cardiomyopathy: Secondary | ICD-10-CM | POA: Diagnosis not present

## 2024-10-18 DIAGNOSIS — I2510 Atherosclerotic heart disease of native coronary artery without angina pectoris: Secondary | ICD-10-CM

## 2024-10-18 DIAGNOSIS — E1169 Type 2 diabetes mellitus with other specified complication: Secondary | ICD-10-CM | POA: Diagnosis not present

## 2024-10-18 DIAGNOSIS — E785 Hyperlipidemia, unspecified: Secondary | ICD-10-CM | POA: Diagnosis not present

## 2024-10-18 MED ORDER — NITROGLYCERIN 0.4 MG SL SUBL: 0.4000 mg | SUBLINGUAL_TABLET | SUBLINGUAL | 2 refills | Status: AC | PRN

## 2024-10-18 MED ORDER — ATORVASTATIN CALCIUM 80 MG PO TABS: 80.0000 mg | ORAL_TABLET | Freq: Every day | ORAL | 3 refills | Status: AC

## 2024-10-18 MED ORDER — ASPIRIN 81 MG PO TBEC: 81.0000 mg | DELAYED_RELEASE_TABLET | Freq: Every day | ORAL | 3 refills | Status: AC

## 2024-10-18 MED ORDER — PROPRANOLOL HCL 80 MG PO TABS: 80.0000 mg | ORAL_TABLET | Freq: Two times a day (BID) | ORAL | 3 refills | Status: AC

## 2024-10-18 NOTE — Patient Instructions (Signed)
 Medication Instructions:   Your physician recommends that you continue on your current medications as directed. Please refer to the Current Medication list given to you today.    *If you need a refill on your cardiac medications before your next appointment, please call your pharmacy*  Lab Work:  None ordered at this time   If you have labs (blood work) drawn today and your tests are completely normal, you will receive your results only by:  MyChart Message (if you have MyChart) OR  A paper copy in the mail If you have any lab test that is abnormal or we need to change your treatment, we will call you to review the results.  Testing/Procedures:  None ordered at this time   Referrals:  None ordered at this time   Follow-Up:  At [facility], you and your health needs are our priority.  As part of our continuing mission to provide you with exceptional heart care, our providers are all part of one team.  This team includes your primary Cardiologist (physician) and Advanced Practice Providers or APPs (Physician Assistants and Nurse Practitioners) who all work together to provide you with the care you need, when you need it.  Your next appointment:   5 - 6 month(s)  Provider:    You may see Lonni Hanson, MD or one of the following Advanced Practice Providers on your designated Care Team:   Lonni Meager, NP Lesley Maffucci, PA-C Bernardino Bring, PA-C Cadence Griffith, PA-C Tylene Lunch, NP Barnie Hila, NP    We recommend signing up for the patient portal called MyChart.  Sign up information is provided on this After Visit Summary.  MyChart is used to connect with patients for Virtual Visits (Telemedicine).  Patients are able to view lab/test results, encounter notes, upcoming appointments, etc.  Non-urgent messages can be sent to your provider as well.   To learn more about what you can do with MyChart, go to [email or web address].

## 2024-10-18 NOTE — Progress Notes (Signed)
 Cardiology Office Note   Date:  10/18/2024  ID:  ROY SNUFFER, DOB 07-21-1956, MRN [id number] PCP: Tammie Smith  [facility] Providers Cardiologist:  Lonni Hanson, Smith   History of Present Illness Tammie Smith is a 68 y.o. female with a h/o CAD with NSTEMI in 08/2022 s/p staged multivessel PCI, HLD, DM2, carotid artery disease, degenerative disc disease, esophagitis who presents for follow-up of CAD.   Patient was last seen 01/24/2024 reporting severe GI issues needing EGD.  She reported chest pain and shortness of breath that she felt was from her gastric issues.  She was not taking heart medications due to feeling so ill, but had recently started Brilinta , aspirin , and Jardiance.  Cardiac PET stress was ordered for preop evaluation.  It was recommended she continue aspirin , Lipitor, propranolol , and sublingual nitroglycerin  as needed.  Myoview  Lexi scan was normal and low risk.  Today, the patient reports GI issues have improved. She felt protonix  was making GI issues worse. Says EGD was negative and is watching what she eats. She denies chest pain, SOB, lower leg edema. She feels tired more easily. She has a cold and is affecting her energy level.   Studies Reviewed      MPI 03/2024 IMPRESSION: 1. No reversible ischemia or infarction.   2. Normal left ventricular wall motion.   3. Left ventricular ejection fraction 73%   4. Non invasive risk stratification*: Low  Limited echo 04/2023  1. Left ventricular ejection fraction, by estimation, is 60 to 65%. Left  ventricular ejection fraction by PLAX is 61 %. The left ventricle has  normal function. The left ventricle has no regional wall motion  abnormalities. There is mild left ventricular  hypertrophy. Left ventricular diastolic parameters are consistent with  Grade I diastolic dysfunction (impaired relaxation).   2. Right ventricular systolic function is normal. The right ventricular  size is normal.  Tricuspid regurgitation signal is inadequate for assessing  PA pressure.   3. The mitral valve is normal in structure. No evidence of mitral valve  regurgitation. No evidence of mitral stenosis.   4. The aortic valve is tricuspid. Aortic valve regurgitation is mild. No  aortic stenosis is present.   5. The inferior vena cava is normal in size with greater than 50%  respiratory variability, suggesting right atrial pressure of 3 mmHg.    Coronary stent intervention 09/2022 Conclusions: Severe LAD disease with sequential 50%, 80-90%, and 50% mid to distal vessel lesions as well as 90% stenosis involving small D3 branch. Widely patent overlapping OM1 stents. Normal left ventricular filling pressure (LVEDP 14 mmHg). Successful IVUS-guided PCI to 80-90% mid LAD stenosis using Synergy 2.75 x 20 mm drug-eluting stent (postdilated to 3.1 mm) with 0% residual stenosis and TIMI-3 flow.   Recommendations: Continue dual antiplatelet therapy with aspirin  and ticagrelor  for at least 12 months from recent NSTEMI. Decrease isosorbide  mononitrate to 15 mg daily given significant headaches at higher doses. Aggressive secondary prevention of coronary artery disease. Anticipate same-day discharge if no complications during 6-hour post-PCI monitoring period.   Lonni Hanson, Smith [facility]       [facility] 08/2022 Conclusions: Severe three-vessel coronary artery disease, including long segment of calcified mid LAD disease of up to 80%, 50% distal LAD stenosis, subtotal occlusion (99% with TIMI-1 flow) of OM1, and 70% proximal stenosis of small, non-dominant RCA. Mildly elevated left ventricular filling pressure (LVEDP 17 mmHg). Successful PCI to OM1 using overlapping Onyx Frontier 2.25 x 22 mm  and 2.0 x 12 mm drug-eluting stents with 0% residual stenosis and TIMI-3.  Initial stent placement was complicated by distal edge dissection successful treated with overlapping stent at the distal margin.    Recommendations: Dual antiplatelet therapy with aspirin  and ticagrelor  for at least 12 months. Aggressive secondary prevention.  We have agreed to rechallenge [name] with statin therapy. Plan for medical management of LAD disease with further discussion of staged PCI to be done at follow-up.  We will continue recently added propranolol  and also start isosorbide  mononitrate. Outpatient cardiac rehab referral has been placed.   Lonni Hanson, Smith [facility]   Echo 08/2022  1. Left ventricular ejection fraction, by estimation, is 55 to 60%. The  left ventricle has normal function. The left ventricle demonstrates  regional wall motion abnormalities (see scoring diagram/findings for  description). There is mild left ventricular   hypertrophy. Left ventricular diastolic parameters are consistent with  Grade I diastolic dysfunction (impaired relaxation). Elevated left atrial  pressure. There is moderate hypokinesis of the left ventricular, basal-mid  inferolateral wall.   2. Right ventricular systolic function is normal. The right ventricular  size is normal. Tricuspid regurgitation signal is inadequate for assessing  PA pressure.   3. The mitral valve is normal in structure. Trivial mitral valve  regurgitation.   4. The aortic valve is tricuspid. There is mild thickening of the aortic  valve. Aortic valve regurgitation is mild.   5. The inferior vena cava is normal in size with <50% respiratory  variability, suggesting right atrial pressure of 8 mmHg.      Physical Exam VS:  BP (!) 140/80 (BP Location: Left Arm, Patient Position: Sitting)   Pulse 77   Ht 5' 3 (1.6 m)   Wt 149 lb 8 oz (67.8 kg)   SpO2 98%   BMI 26.48 kg/m        Wt Readings from Last 3 Encounters:  10/18/24 149 lb 8 oz (67.8 kg)  09/07/24 147 lb 9.6 oz (67 kg)  02/20/24 151 lb 9.6 oz (68.8 kg)    GEN: Well nourished, well developed in no acute distress NECK: No JVD; No carotid bruits CARDIAC: RRR, no  murmurs, rubs, gallops RESPIRATORY:  Clear to auscultation without rales, wheezing or rhonchi  ABDOMEN: Soft, non-tender, non-distended EXTREMITIES:  No edema; No deformity   ASSESSMENT AND PLAN  CAD s/p PCI/DES (overlapping stents) OM1 08/2022 and staged PCI/DES in the mid LAD in 09/2022 The patient denies anginal symptoms. She is not doing any formal exercise. She is moving to [place]  in the next few months to be close to her son and his family. She is needing refills of cardiac medications, so these will be sent in.  Continue aspirin  81 mg daily, Lipitor 80 mg daily, propranolol  80 mg daily, and submental nitroglycerin .  HLD with DM2 LDL 76, goal<70. Continue Lipitor 80mg  daily. She did not tolerate Zetia  10mg  daily.   ICM Limited echo 04/2023 showed LVEF 60-65%, no WMA, mild LVH, mild AI. She is euvolemic on exam. Continue Jardiance and BB therapy.     Dispo: Follow-up in 6 months  Signed, Janah Mcculloh VEAR Fishman, PA-C

## 2024-11-14 ENCOUNTER — Encounter: Payer: Self-pay | Admitting: Gastroenterology

## 2024-11-19 ENCOUNTER — Encounter

## 2024-12-14 ENCOUNTER — Ambulatory Visit
Admission: RE | Admit: 2024-12-14 | Discharge: 2024-12-14 | Disposition: A | Source: Ambulatory Visit | Attending: Obstetrics and Gynecology | Admitting: Obstetrics and Gynecology

## 2024-12-14 DIAGNOSIS — Z1231 Encounter for screening mammogram for malignant neoplasm of breast: Secondary | ICD-10-CM | POA: Insufficient documentation

## 2025-03-19 ENCOUNTER — Ambulatory Visit: Admitting: Medical
